# Patient Record
Sex: Female | Born: 1960 | Race: Black or African American | Hispanic: No | Marital: Married | Smoking: Former smoker
Health system: Southern US, Community
[De-identification: ages and names within clinical notes are randomized; demographics above are authoritative.]

## PROBLEM LIST (undated history)

## (undated) DIAGNOSIS — S83249A Other tear of medial meniscus, current injury, unspecified knee, initial encounter: Secondary | ICD-10-CM

## (undated) HISTORY — PX: ECTOPIC PREGNANCY SURGERY: SHX613

## (undated) HISTORY — PX: TUBAL LIGATION: SHX77

## (undated) HISTORY — PX: LAPAROSCOPIC TUBAL LIGATION: SUR803

## (undated) HISTORY — PX: APPENDECTOMY: SHX54

---

## 1998-04-15 ENCOUNTER — Ambulatory Visit: Admission: RE | Admit: 1998-04-15 | Discharge: 1998-04-15 | Payer: Self-pay | Admitting: Family Medicine

## 1998-04-16 ENCOUNTER — Ambulatory Visit (HOSPITAL_COMMUNITY): Admission: RE | Admit: 1998-04-16 | Discharge: 1998-04-16 | Payer: Self-pay | Admitting: Family Medicine

## 1999-07-03 ENCOUNTER — Inpatient Hospital Stay (HOSPITAL_COMMUNITY): Admission: AD | Admit: 1999-07-03 | Discharge: 1999-07-03 | Payer: Self-pay | Admitting: Family Medicine

## 1999-07-04 ENCOUNTER — Inpatient Hospital Stay (HOSPITAL_COMMUNITY): Admission: AD | Admit: 1999-07-04 | Discharge: 1999-07-04 | Payer: Self-pay | Admitting: Family Medicine

## 1999-12-29 ENCOUNTER — Emergency Department (HOSPITAL_COMMUNITY): Admission: EM | Admit: 1999-12-29 | Discharge: 1999-12-29 | Payer: Self-pay | Admitting: Emergency Medicine

## 2000-03-16 ENCOUNTER — Other Ambulatory Visit: Admission: RE | Admit: 2000-03-16 | Discharge: 2000-03-16 | Payer: Self-pay | Admitting: Family Medicine

## 2000-11-25 ENCOUNTER — Encounter: Payer: Self-pay | Admitting: Family Medicine

## 2000-11-25 ENCOUNTER — Encounter: Admission: RE | Admit: 2000-11-25 | Discharge: 2000-11-25 | Payer: Self-pay | Admitting: Family Medicine

## 2001-08-09 ENCOUNTER — Other Ambulatory Visit: Admission: RE | Admit: 2001-08-09 | Discharge: 2001-08-09 | Payer: Self-pay | Admitting: Family Medicine

## 2001-08-20 ENCOUNTER — Emergency Department (HOSPITAL_COMMUNITY): Admission: EM | Admit: 2001-08-20 | Discharge: 2001-08-20 | Payer: Self-pay | Admitting: Emergency Medicine

## 2002-04-30 ENCOUNTER — Emergency Department (HOSPITAL_COMMUNITY): Admission: EM | Admit: 2002-04-30 | Discharge: 2002-04-30 | Payer: Self-pay | Admitting: Emergency Medicine

## 2002-08-28 ENCOUNTER — Inpatient Hospital Stay (HOSPITAL_COMMUNITY): Admission: AD | Admit: 2002-08-28 | Discharge: 2002-08-28 | Payer: Self-pay | Admitting: Family Medicine

## 2002-11-14 ENCOUNTER — Encounter: Payer: Self-pay | Admitting: Emergency Medicine

## 2002-11-14 ENCOUNTER — Emergency Department (HOSPITAL_COMMUNITY): Admission: EM | Admit: 2002-11-14 | Discharge: 2002-11-14 | Payer: Self-pay | Admitting: Emergency Medicine

## 2003-07-28 ENCOUNTER — Emergency Department (HOSPITAL_COMMUNITY): Admission: EM | Admit: 2003-07-28 | Discharge: 2003-07-28 | Payer: Self-pay | Admitting: Emergency Medicine

## 2004-03-04 ENCOUNTER — Emergency Department (HOSPITAL_COMMUNITY): Admission: EM | Admit: 2004-03-04 | Discharge: 2004-03-04 | Payer: Self-pay | Admitting: Emergency Medicine

## 2004-06-21 ENCOUNTER — Emergency Department (HOSPITAL_COMMUNITY): Admission: EM | Admit: 2004-06-21 | Discharge: 2004-06-21 | Payer: Self-pay | Admitting: Emergency Medicine

## 2004-07-07 ENCOUNTER — Emergency Department (HOSPITAL_COMMUNITY): Admission: EM | Admit: 2004-07-07 | Discharge: 2004-07-08 | Payer: Self-pay | Admitting: Emergency Medicine

## 2004-07-29 ENCOUNTER — Ambulatory Visit: Payer: Self-pay | Admitting: Family Medicine

## 2004-10-14 ENCOUNTER — Ambulatory Visit: Payer: Self-pay | Admitting: Family Medicine

## 2004-10-14 DIAGNOSIS — K219 Gastro-esophageal reflux disease without esophagitis: Secondary | ICD-10-CM

## 2004-11-25 ENCOUNTER — Ambulatory Visit: Payer: Self-pay | Admitting: Family Medicine

## 2004-12-18 ENCOUNTER — Ambulatory Visit (HOSPITAL_COMMUNITY): Admission: RE | Admit: 2004-12-18 | Discharge: 2004-12-18 | Payer: Self-pay | Admitting: Family Medicine

## 2005-01-26 ENCOUNTER — Ambulatory Visit: Payer: Self-pay | Admitting: Family Medicine

## 2005-01-28 ENCOUNTER — Ambulatory Visit: Payer: Self-pay | Admitting: Family Medicine

## 2005-12-14 ENCOUNTER — Emergency Department (HOSPITAL_COMMUNITY): Admission: EM | Admit: 2005-12-14 | Discharge: 2005-12-14 | Payer: Self-pay | Admitting: Family Medicine

## 2006-07-19 ENCOUNTER — Emergency Department (HOSPITAL_COMMUNITY): Admission: EM | Admit: 2006-07-19 | Discharge: 2006-07-19 | Payer: Self-pay | Admitting: Emergency Medicine

## 2006-08-14 ENCOUNTER — Emergency Department (HOSPITAL_COMMUNITY): Admission: EM | Admit: 2006-08-14 | Discharge: 2006-08-15 | Payer: Self-pay | Admitting: Emergency Medicine

## 2006-09-16 ENCOUNTER — Ambulatory Visit: Payer: Self-pay | Admitting: Family Medicine

## 2006-09-19 ENCOUNTER — Ambulatory Visit: Payer: Self-pay | Admitting: Family Medicine

## 2006-12-26 ENCOUNTER — Ambulatory Visit: Payer: Self-pay | Admitting: Family Medicine

## 2006-12-26 ENCOUNTER — Encounter (INDEPENDENT_AMBULATORY_CARE_PROVIDER_SITE_OTHER): Payer: Self-pay | Admitting: Family Medicine

## 2007-01-26 ENCOUNTER — Ambulatory Visit (HOSPITAL_COMMUNITY): Admission: RE | Admit: 2007-01-26 | Discharge: 2007-01-26 | Payer: Self-pay | Admitting: Internal Medicine

## 2007-01-30 ENCOUNTER — Emergency Department (HOSPITAL_COMMUNITY): Admission: EM | Admit: 2007-01-30 | Discharge: 2007-01-30 | Payer: Self-pay | Admitting: Emergency Medicine

## 2007-02-01 ENCOUNTER — Ambulatory Visit: Payer: Self-pay | Admitting: *Deleted

## 2007-08-02 ENCOUNTER — Encounter (INDEPENDENT_AMBULATORY_CARE_PROVIDER_SITE_OTHER): Payer: Self-pay | Admitting: *Deleted

## 2008-03-05 ENCOUNTER — Encounter (INDEPENDENT_AMBULATORY_CARE_PROVIDER_SITE_OTHER): Payer: Self-pay | Admitting: Family Medicine

## 2008-03-05 ENCOUNTER — Ambulatory Visit: Payer: Self-pay | Admitting: Family Medicine

## 2008-03-05 DIAGNOSIS — F17200 Nicotine dependence, unspecified, uncomplicated: Secondary | ICD-10-CM | POA: Insufficient documentation

## 2008-03-05 LAB — CONVERTED CEMR LAB
BUN: 15 mg/dL (ref 6–23)
Basophils Relative: 0 % (ref 0–1)
CO2: 28 meq/L (ref 19–32)
Cholesterol: 173 mg/dL (ref 0–200)
Eosinophils Absolute: 0.1 10*3/uL (ref 0.0–0.7)
Glucose, Bld: 80 mg/dL (ref 70–99)
Hemoglobin: 13.8 g/dL (ref 12.0–15.0)
Ketones, urine, test strip: NEGATIVE
LDL Cholesterol: 100 mg/dL — ABNORMAL HIGH (ref 0–99)
Lymphocytes Relative: 54 % — ABNORMAL HIGH (ref 12–46)
Lymphs Abs: 4.8 10*3/uL — ABNORMAL HIGH (ref 0.7–4.0)
MCHC: 30.9 g/dL (ref 30.0–36.0)
MCV: 94.9 fL (ref 78.0–100.0)
Monocytes Absolute: 0.5 10*3/uL (ref 0.1–1.0)
Neutrophils Relative %: 40 % — ABNORMAL LOW (ref 43–77)
Nitrite: NEGATIVE
Pap Smear: NORMAL
Platelets: 386 10*3/uL (ref 150–400)
Protein, U semiquant: NEGATIVE
TSH: 1.806 microintl units/mL (ref 0.350–5.50)
Total CHOL/HDL Ratio: 3.1
Urobilinogen, UA: 0.2
VLDL: 18 mg/dL (ref 0–40)
WBC Urine, dipstick: NEGATIVE
pH: 6

## 2009-03-05 ENCOUNTER — Encounter (INDEPENDENT_AMBULATORY_CARE_PROVIDER_SITE_OTHER): Payer: Self-pay | Admitting: Family Medicine

## 2009-03-05 ENCOUNTER — Other Ambulatory Visit: Admission: RE | Admit: 2009-03-05 | Discharge: 2009-03-05 | Payer: Self-pay | Admitting: Family Medicine

## 2009-03-05 ENCOUNTER — Ambulatory Visit: Payer: Self-pay | Admitting: Family Medicine

## 2009-03-05 DIAGNOSIS — M62838 Other muscle spasm: Secondary | ICD-10-CM

## 2009-03-05 DIAGNOSIS — R1310 Dysphagia, unspecified: Secondary | ICD-10-CM

## 2009-03-05 DIAGNOSIS — N951 Menopausal and female climacteric states: Secondary | ICD-10-CM | POA: Insufficient documentation

## 2009-03-05 LAB — CONVERTED CEMR LAB
ALT: 21 units/L (ref 0–35)
AST: 21 units/L (ref 0–37)
Albumin: 4.3 g/dL (ref 3.5–5.2)
Basophils Relative: 1 % (ref 0–1)
Eosinophils Relative: 2 % (ref 0–5)
Glucose, Bld: 81 mg/dL (ref 70–99)
HCT: 41.4 % (ref 36.0–46.0)
HDL: 55 mg/dL (ref >39)
Hemoglobin: 13.1 g/dL (ref 12.0–15.0)
Lymphocytes Relative: 54 % — ABNORMAL HIGH (ref 12–46)
MCHC: 31.6 g/dL (ref 30.0–36.0)
Monocytes Relative: 5 % (ref 3–12)
Neutro Abs: 3.2 10*3/uL (ref 1.7–7.7)
Neutrophils Relative %: 39 % — ABNORMAL LOW (ref 43–77)
RBC: 4.57 M/uL (ref 3.87–5.11)
Sodium: 143 meq/L (ref 135–145)
TSH: 1.489 microintl units/mL (ref 0.350–4.500)
Total Bilirubin: 0.2 mg/dL — ABNORMAL LOW (ref 0.3–1.2)
WBC: 8.3 10*3/uL (ref 4.0–10.5)

## 2009-03-07 ENCOUNTER — Ambulatory Visit (HOSPITAL_COMMUNITY): Admission: RE | Admit: 2009-03-07 | Discharge: 2009-03-07 | Payer: Self-pay | Admitting: Family Medicine

## 2009-03-07 ENCOUNTER — Encounter (INDEPENDENT_AMBULATORY_CARE_PROVIDER_SITE_OTHER): Payer: Self-pay | Admitting: Family Medicine

## 2009-03-09 ENCOUNTER — Encounter (INDEPENDENT_AMBULATORY_CARE_PROVIDER_SITE_OTHER): Payer: Self-pay | Admitting: Family Medicine

## 2009-03-12 ENCOUNTER — Encounter (INDEPENDENT_AMBULATORY_CARE_PROVIDER_SITE_OTHER): Payer: Self-pay | Admitting: Family Medicine

## 2009-03-14 ENCOUNTER — Ambulatory Visit (HOSPITAL_COMMUNITY): Admission: RE | Admit: 2009-03-14 | Discharge: 2009-03-14 | Payer: Self-pay | Admitting: Family Medicine

## 2009-04-01 ENCOUNTER — Ambulatory Visit: Payer: Self-pay | Admitting: Family Medicine

## 2009-04-03 ENCOUNTER — Ambulatory Visit: Payer: Self-pay | Admitting: Family Medicine

## 2009-05-29 ENCOUNTER — Emergency Department (HOSPITAL_COMMUNITY): Admission: EM | Admit: 2009-05-29 | Discharge: 2009-05-29 | Payer: Self-pay | Admitting: Family Medicine

## 2009-06-04 ENCOUNTER — Emergency Department (HOSPITAL_COMMUNITY): Admission: EM | Admit: 2009-06-04 | Discharge: 2009-06-04 | Payer: Self-pay | Admitting: Emergency Medicine

## 2010-03-05 ENCOUNTER — Emergency Department (HOSPITAL_COMMUNITY): Admission: EM | Admit: 2010-03-05 | Discharge: 2010-03-05 | Payer: Self-pay | Admitting: Family Medicine

## 2010-07-20 ENCOUNTER — Emergency Department (HOSPITAL_COMMUNITY): Admission: EM | Admit: 2010-07-20 | Discharge: 2010-07-20 | Payer: Self-pay | Admitting: Emergency Medicine

## 2010-11-11 ENCOUNTER — Ambulatory Visit (HOSPITAL_COMMUNITY)
Admission: RE | Admit: 2010-11-11 | Discharge: 2010-11-11 | Payer: Self-pay | Source: Home / Self Care | Attending: Family Medicine | Admitting: Family Medicine

## 2010-11-12 ENCOUNTER — Encounter (INDEPENDENT_AMBULATORY_CARE_PROVIDER_SITE_OTHER): Payer: Self-pay | Admitting: Nurse Practitioner

## 2010-11-13 ENCOUNTER — Encounter (INDEPENDENT_AMBULATORY_CARE_PROVIDER_SITE_OTHER): Payer: Self-pay | Admitting: Internal Medicine

## 2010-11-17 ENCOUNTER — Encounter (INDEPENDENT_AMBULATORY_CARE_PROVIDER_SITE_OTHER): Payer: Self-pay | Admitting: Internal Medicine

## 2010-11-18 ENCOUNTER — Encounter
Admission: RE | Admit: 2010-11-18 | Discharge: 2010-11-18 | Payer: Self-pay | Source: Home / Self Care | Attending: Internal Medicine | Admitting: Internal Medicine

## 2010-12-06 ENCOUNTER — Encounter: Payer: Self-pay | Admitting: Family Medicine

## 2010-12-08 ENCOUNTER — Encounter (INDEPENDENT_AMBULATORY_CARE_PROVIDER_SITE_OTHER): Payer: Self-pay | Admitting: Internal Medicine

## 2010-12-15 NOTE — Assessment & Plan Note (Signed)
Summary: CPP////KT   Vital Signs:  Patient profile:   50 year old female LMP:     02/13/2009 Weight:      192.5 pounds BMI:     34.77 Temp:     98.7 degrees F Pulse rate:   72 / minute Pulse rhythm:   regular Resp:     20 per minute BP sitting:   124 / 74  (left arm) Cuff size:   large  Vitals Entered By: Vesta Mixer CMA (March 05, 2009 9:57 AM) CC: CPP- Never tried chantix from last year, but may want to now Is Patient Diabetic? No Pain Assessment Patient in pain? no       Does patient need assistance? Ambulation Normal LMP (date): 02/13/2009 LMP - Character: light Menarche (age onset): 14 years   days  Menstrual flow (days) 7 Enter LMP: 02/13/2009 Last PAP Result normal   History of Present Illness: Here for CPP. Married;one partner. Defers STD eval. Is fasting. No vaginal complaints. Has hot flashes and night sweats > 1 year. No vaginal dryness. Not on any meds.  #1 Has been without a menses in 6-9 months then had 1 period in 4 /2010(lasted 5 days).  #2 Having problems swallowing with feeling may choke with  both liquids and solids. Started about 6 months ago. Has improved but still has some choking sensation(Has to hold her neck hyperextended for food to pass).Has happened maybe 4x in last 2 months. Has had a cold recently with mild sorethroat and hoarseness in last week only. Is a smoker.  #3 Sometimes has gas and get sharp pain under diaphragm on back right side...helps when she belches.  #4 Had a pulled muscle several years ago on right side under shoulder. Occasionally causes pain between shoulder blades and around right side.Massage helps.    Allergies (verified): 1)  ! Ampicillin (Ampicillin)  Past History:  Past Medical History:    Reviewed history from 03/05/2008 and no changes required:    GERD (10/14/2004)  Past Surgical History:    Reviewed history from 03/05/2008 and no changes required:    Tubal ligation (1995)    Caesarean  section x 2    Appendectomy, age 50    s/p ectopic pregnancies ,lost one fallopian tube 1994  Family History:    Reviewed history from 03/05/2008 and no changes required:       Mother living Dementia,cirrhosis from Hep C, h/o idiopathic renal failure(s/p renal transplant). Scleroderma              Father living no problems       Brother living no problems              No heart disease/DM/CVAs.       o breast /ovarian/cervical/uterine/colon cancer.....she does say that PGM died of unknown cancer.  Social History:    Reviewed history from 03/05/2008 and no changes required:       Marriedx5 yrs.Monogamous       2 sons       unemployed       Current Smoker       Alcohol use-no       Drug use-no  Physical Exam  General:  Well-developed,well-nourished,in no acute distress; alert,appropriate and cooperative throughout examination Head:  Normocephalic and atraumatic without obvious abnormalities. No apparent alopecia or balding. Eyes:  No corneal or conjunctival inflammation noted. EOMI. Perrla.  Ears:  External ear exam shows no significant lesions or deformities.  Otoscopic examination reveals clear canals,  tympanic membranes are intact bilaterally without bulging, retraction, inflammation or discharge. Hearing is grossly normal bilaterally. Nose:  External nasal examination shows no deformity or inflammation. Nasal mucosa are pink and moist without lesions or exudates. Mouth:  Oral mucosa and oropharynx without lesions or exudates.  Teeth in good repair.pharynx pink and moist, no erythema, and no exudates.   Neck:  supple, no masses, and no thyromegaly.   Breasts:  No mass, nodules, thickening, tenderness, bulging, retraction, inflamation, nipple discharge or skin changes noted.   Lungs:  Normal respiratory effort, chest expands symmetrically. Lungs are clear to auscultation, no crackles or wheezes. Heart:  Normal rate and regular rhythm. S1 and S2 normal without gallop, murmur, click,  rub or other extra sounds. Abdomen:  Bowel sounds positive,abdomen soft and non-tender without masses, organomegaly or hernias noted. Rectal:  deferred Genitalia:  Pelvic Exam:        External: normal female genitalia without lesions or masses        Vagina: normal without lesions or masses        Cervix: normal without lesions or masses        Adnexa: normal bimanual exam without masses or fullness        Uterus: normal by palpation        Pap smear: performed Msk:  No deformity or scoliosis noted of thoracic or lumbar spine.   No current reproducible pain on palpation of right thoracic area/spine. Pulses:  R dorsalis pedis normal and L dorsalis pedis normal.   Extremities:  No CCE Neurologic:  alert & oriented X3 and cranial nerves II-XII intact grossly.  Skin:  Intact without suspicious lesions or rashes Cervical Nodes:  No lymphadenopathy noted Axillary Nodes:  No palpable lymphadenopathy Psych:  Oriented X3, memory intact for recent and remote, normally interactive, good eye contact, not anxious appearing, and not depressed appearing.     Impression & Recommendations:  Problem # 1:  EXAMINATION, ROUTINE MEDICAL (ICD-V70.0) Last Td 12/2006. Saw [facility] last year.  Orders: UA Dipstick w/o Micro (automated)  (81003) T-General Health Panel (CBCD, CMP, TSH) ([address]) T-Lipid Profile (40981-19147) Mammogram Pap done.  Problem # 2:  DYSPHAGIA UNSPECIFIED (ICD-787.20) Will check Barium swallow for esophageal stenosis/web/other abnormality Orders: Radiology other (Radiology Other)  Problem # 3:  MENOPAUSAL SYNDROME (ICD-627.2) Did review s/sx with patient. May eventually need vaginal lubricant as needed. Start Kegel exercises. If has further vaginal bleeding after next 12 months then will need further evaluation.Md d/w patient.  Problem # 4:  MUSCLE SPASM (ICD-728.85) Massage as needed. Supportive Bra.  Complete Medication List: 1)  Chantix Starting Month Pak  0.5 Mg X 11 & 1 Mg X 42 Misc (Varenicline tartrate) .... Per directions 2)  Chantix Continuing Month Pak 1 Mg Tabs (Varenicline tartrate) .... Per direction  Other Orders: Mammogram (Screening) (Mammo) Thin Prep Pap (82956) Pap Smear, Thin Prep ( Collection of) (O1308)  Patient Instructions: 1)  Please schedule a follow-up appointment in 1 year.  2)  Tobacco is very bad for your health and your loved ones ! You should stop smoking !  3)  Stop smoking tips: Choose a quit date. Cut down before the quit date. Decide what you will do as a substitute when you feel the urge to smoke(gum, toothpick, exercise).    Tetanus/Td Immunization History:    Tetanus/Td # 1:  Td (12/16/2006)     Appended Document: CPP////KT  Laboratory Results   Urine Tests    Routine Urinalysis  Glucose: negative   (Normal Range: Negative) Bilirubin: negative   (Normal Range: Negative) Ketone: negative   (Normal Range: Negative) Spec. Gravity: >=1.030   (Normal Range: 1.003-1.035) Blood: negative   (Normal Range: Negative) pH: 5.5   (Normal Range: 5.0-8.0) Protein: trace   (Normal Range: Negative) Urobilinogen: 0.2   (Normal Range: 0-1) Nitrite: negative   (Normal Range: Negative) Leukocyte Esterace: negative   (Normal Range: Negative)

## 2010-12-17 NOTE — Letter (Signed)
Summary: AUTHORIZATION FOR HEALTH INFORMATION//NOT COMPLETE  AUTHORIZATION FOR HEALTH INFORMATION//NOT COMPLETE   Imported By: Arta Bruce 11/23/2010 16:14:48  _____________________________________________________________________  External Attachment:    Type:   Image     Comment:   External Document

## 2011-02-02 LAB — BASIC METABOLIC PANEL
CO2: 28 mEq/L (ref 19–32)
Calcium: 9.3 mg/dL (ref 8.4–10.5)
Chloride: 104 mEq/L (ref 96–112)
GFR calc Af Amer: >60 mL/min (ref >60)
Glucose, Bld: 85 mg/dL (ref 70–99)

## 2011-02-02 LAB — CBC
Hemoglobin: 13.3 g/dL (ref 12.0–15.0)
MCHC: 34.1 g/dL (ref 30.0–36.0)
MCV: 91.1 fL (ref 78.0–100.0)
Platelets: 372 10*3/uL (ref 150–400)
RDW: 13.6 % (ref 11.5–15.5)

## 2011-02-21 LAB — CBC
HCT: 38.6 % (ref 36.0–46.0)
Platelets: 346 10*3/uL (ref 150–400)
WBC: 9.8 10*3/uL (ref 4.0–10.5)

## 2011-02-21 LAB — DIFFERENTIAL
Basophils Absolute: 0.1 10*3/uL (ref 0.0–0.1)
Lymphocytes Relative: 53 % — ABNORMAL HIGH (ref 12–46)
Monocytes Absolute: 0.4 10*3/uL (ref 0.1–1.0)
Neutrophils Relative %: 39 % — ABNORMAL LOW (ref 43–77)

## 2011-02-21 LAB — BASIC METABOLIC PANEL
CO2: 23 mEq/L (ref 19–32)
Chloride: 109 mEq/L (ref 96–112)
GFR calc Af Amer: >60 mL/min (ref >60)
Potassium: 4.2 mEq/L (ref 3.5–5.1)
Sodium: 139 mEq/L (ref 135–145)

## 2011-04-26 ENCOUNTER — Other Ambulatory Visit: Payer: Self-pay | Admitting: Internal Medicine

## 2011-04-26 DIAGNOSIS — N6310 Unspecified lump in the right breast, unspecified quadrant: Secondary | ICD-10-CM

## 2011-05-21 ENCOUNTER — Ambulatory Visit
Admission: RE | Admit: 2011-05-21 | Discharge: 2011-05-21 | Disposition: A | Discharge status: Home / Self Care | Payer: Self-pay | Source: Ambulatory Visit | Attending: Internal Medicine | Admitting: Internal Medicine

## 2011-05-21 DIAGNOSIS — N6310 Unspecified lump in the right breast, unspecified quadrant: Secondary | ICD-10-CM

## 2011-06-22 ENCOUNTER — Other Ambulatory Visit: Payer: Self-pay | Admitting: Family Medicine

## 2011-07-26 ENCOUNTER — Inpatient Hospital Stay (INDEPENDENT_AMBULATORY_CARE_PROVIDER_SITE_OTHER)
Admission: RE | Admit: 2011-07-26 | Discharge: 2011-07-26 | Disposition: A | Discharge status: Home / Self Care | Payer: Self-pay | Source: Ambulatory Visit | Attending: Family Medicine | Admitting: Family Medicine

## 2011-07-26 DIAGNOSIS — S0180XA Unspecified open wound of other part of head, initial encounter: Secondary | ICD-10-CM

## 2011-08-03 ENCOUNTER — Inpatient Hospital Stay (HOSPITAL_COMMUNITY)
Admission: RE | Admit: 2011-08-03 | Discharge: 2011-08-03 | Disposition: A | Discharge status: Home / Self Care | Payer: Self-pay | Source: Ambulatory Visit | Attending: Family Medicine | Admitting: Family Medicine

## 2011-09-11 ENCOUNTER — Inpatient Hospital Stay (INDEPENDENT_AMBULATORY_CARE_PROVIDER_SITE_OTHER)
Admission: RE | Admit: 2011-09-11 | Discharge: 2011-09-11 | Disposition: A | Discharge status: Home / Self Care | Payer: Self-pay | Source: Ambulatory Visit | Attending: Emergency Medicine | Admitting: Emergency Medicine

## 2011-09-11 DIAGNOSIS — N390 Urinary tract infection, site not specified: Secondary | ICD-10-CM

## 2011-09-11 LAB — POCT URINALYSIS DIP (DEVICE)
Bilirubin Urine: NEGATIVE
Glucose, UA: NEGATIVE mg/dL
Hgb urine dipstick: NEGATIVE
Ketones, ur: NEGATIVE mg/dL
Leukocytes, UA: NEGATIVE
Nitrite: POSITIVE — AB
Protein, ur: NEGATIVE mg/dL
Urobilinogen, UA: 0.2 mg/dL (ref 0.0–1.0)
pH: 5.5 (ref 5.0–8.0)

## 2011-10-30 ENCOUNTER — Emergency Department (HOSPITAL_COMMUNITY)
Admission: EM | Admit: 2011-10-30 | Discharge: 2011-10-30 | Payer: Self-pay | Source: Home / Self Care | Attending: Family Medicine | Admitting: Family Medicine

## 2011-10-30 ENCOUNTER — Emergency Department (INDEPENDENT_AMBULATORY_CARE_PROVIDER_SITE_OTHER)
Admission: EM | Admit: 2011-10-30 | Discharge: 2011-10-30 | Disposition: A | Discharge status: Home / Self Care | Payer: Self-pay | Source: Home / Self Care | Attending: Family Medicine | Admitting: Family Medicine

## 2011-10-30 DIAGNOSIS — M7550 Bursitis of unspecified shoulder: Secondary | ICD-10-CM

## 2011-10-30 DIAGNOSIS — M75100 Unspecified rotator cuff tear or rupture of unspecified shoulder, not specified as traumatic: Secondary | ICD-10-CM

## 2011-10-30 DIAGNOSIS — IMO0002 Reserved for concepts with insufficient information to code with codable children: Secondary | ICD-10-CM

## 2011-10-30 MED ORDER — KETOROLAC TROMETHAMINE 60 MG/2ML IM SOLN
60.0000 mg | Freq: Once | INTRAMUSCULAR | Status: AC
  Administered 2011-10-30: 60 mg via INTRAMUSCULAR

## 2011-10-30 MED ORDER — KETOROLAC TROMETHAMINE 60 MG/2ML IM SOLN
INTRAMUSCULAR | Status: AC
  Filled 2011-10-30: qty 2

## 2011-10-30 MED ORDER — MELOXICAM 7.5 MG PO TABS: 7.5000 mg | ORAL_TABLET | Freq: Every day | ORAL | Status: AC

## 2011-10-30 MED ORDER — TRAMADOL HCL 50 MG PO TABS: 50.0000 mg | ORAL_TABLET | Freq: Four times a day (QID) | ORAL | Status: AC | PRN
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
## 2011-10-30 NOTE — ED Notes (Signed)
Pt has muscle spasm in lt upper back for three days

## 2011-10-30 NOTE — ED Provider Notes (Cosign Needed Addendum)
History     CSN: [id number] Arrival date & time: 10/30/2011  4:07 PM   First MD Initiated Contact with Patient 10/30/11 1423      No chief complaint on file.   (Consider location/radiation/quality/duration/timing/severity/associated sxs/prior treatment) HPI  No past medical history on file.  No past surgical history on file.  No family history on file.  History  Substance Use Topics  . Smoking status: Never Smoker   . Smokeless tobacco: Not on file  . Alcohol Use: No    OB History    Grav Para Term Preterm Abortions TAB SAB Ect Mult Living                  Review of Systems  Allergies  Ampicillin and Percocet  Home Medications   Current Outpatient Rx  Name Route Sig Dispense Refill  . MELOXICAM 7.5 MG PO TABS Oral Take 1 tablet (7.5 mg total) by mouth daily. 30 tablet 2  . TRAMADOL HCL 50 MG PO TABS Oral Take 1 tablet (50 mg total) by mouth every 6 (six) hours as needed for pain. Maximum dose= 8 tablets per day 30 tablet 0    There were no vitals taken for this visit.  Physical Exam  ED Course  Procedures (including critical care time)  Labs Reviewed - No data to display No results found.   No diagnosis found.    MDM          Hassan Rowan, MD 10/30/11 2129  Hassan Rowan, MD 10/30/11 2137  Hassan Rowan, MD 10/30/11 2138  Hassan Rowan, MD 10/30/11 5621  Hassan Rowan, MD 10/30/11 3086  Hassan Rowan, MD 10/30/11 2141

## 2011-11-01 ENCOUNTER — Other Ambulatory Visit: Payer: Self-pay

## 2011-11-01 ENCOUNTER — Emergency Department (HOSPITAL_COMMUNITY)
Admission: EM | Admit: 2011-11-01 | Discharge: 2011-11-01 | Disposition: A | Discharge status: Home / Self Care | Payer: Self-pay | Attending: Emergency Medicine | Admitting: Emergency Medicine

## 2011-11-01 ENCOUNTER — Emergency Department (INDEPENDENT_AMBULATORY_CARE_PROVIDER_SITE_OTHER): Payer: Self-pay

## 2011-11-01 ENCOUNTER — Emergency Department (INDEPENDENT_AMBULATORY_CARE_PROVIDER_SITE_OTHER)
Admission: EM | Admit: 2011-11-01 | Discharge: 2011-11-01 | Disposition: A | Discharge status: Nursing Home (Includes ICF) | Payer: Self-pay | Source: Home / Self Care | Attending: Emergency Medicine | Admitting: Emergency Medicine

## 2011-11-01 ENCOUNTER — Encounter (HOSPITAL_COMMUNITY): Payer: Self-pay | Admitting: Emergency Medicine

## 2011-11-01 ENCOUNTER — Encounter (HOSPITAL_COMMUNITY): Payer: Self-pay

## 2011-11-01 DIAGNOSIS — M546 Pain in thoracic spine: Secondary | ICD-10-CM

## 2011-11-01 DIAGNOSIS — M549 Dorsalgia, unspecified: Secondary | ICD-10-CM

## 2011-11-01 DIAGNOSIS — Z79899 Other long term (current) drug therapy: Secondary | ICD-10-CM | POA: Insufficient documentation

## 2011-11-01 DIAGNOSIS — M5380 Other specified dorsopathies, site unspecified: Secondary | ICD-10-CM | POA: Insufficient documentation

## 2011-11-01 DIAGNOSIS — M545 Low back pain, unspecified: Secondary | ICD-10-CM | POA: Insufficient documentation

## 2011-11-01 LAB — POCT I-STAT, CHEM 8
BUN: 17 mg/dL (ref 6–23)
Calcium, Ion: 1.25 mmol/L (ref 1.12–1.32)
Creatinine, Ser: 0.7 mg/dL (ref 0.50–1.10)
HCT: 42 % (ref 36.0–46.0)
Hemoglobin: 14.3 g/dL (ref 12.0–15.0)
Potassium: 4 mEq/L (ref 3.5–5.1)
Sodium: 141 mEq/L (ref 135–145)

## 2011-11-01 LAB — POCT URINALYSIS DIP (DEVICE)
Glucose, UA: NEGATIVE mg/dL
Hgb urine dipstick: NEGATIVE
Specific Gravity, Urine: 1.025 (ref 1.005–1.030)
Urobilinogen, UA: 0.2 mg/dL (ref 0.0–1.0)

## 2011-11-01 MED ORDER — ONDANSETRON HCL 8 MG PO TABS: 8.0000 mg | ORAL_TABLET | Freq: Once | ORAL | Status: DC

## 2011-11-01 MED ORDER — MORPHINE SULFATE 4 MG/ML IJ SOLN
4.0000 mg | Freq: Once | INTRAMUSCULAR | Status: AC
  Administered 2011-11-01: 4 mg via INTRAMUSCULAR
  Filled 2011-11-01: qty 1

## 2011-11-01 MED ORDER — HYDROCODONE-ACETAMINOPHEN 5-325 MG PO TABS: 1.0000 | ORAL_TABLET | ORAL | Status: DC | PRN

## 2011-11-01 MED ORDER — ONDANSETRON HCL 4 MG/2ML IJ SOLN: 4.0000 mg | Freq: Once | INTRAMUSCULAR | Status: DC

## 2011-11-01 MED ORDER — CYCLOBENZAPRINE HCL 10 MG PO TABS: 10.0000 mg | ORAL_TABLET | Freq: Two times a day (BID) | ORAL | Status: AC | PRN

## 2011-11-01 MED ORDER — HYDROCODONE-ACETAMINOPHEN 5-325 MG PO TABS
2.0000 | ORAL_TABLET | Freq: Once | ORAL | Status: AC
  Administered 2011-11-01: 2 via ORAL
  Filled 2011-11-01: qty 2

## 2011-11-01 MED ORDER — ONDANSETRON 4 MG PO TBDP
ORAL_TABLET | ORAL | Status: AC
  Administered 2011-11-01: 8 mg
  Filled 2011-11-01: qty 2

## 2011-11-01 MED ORDER — HYDROCODONE-ACETAMINOPHEN 5-325 MG PO TABS: 1.0000 | ORAL_TABLET | ORAL | Status: AC | PRN

## 2011-11-01 MED ORDER — ONDANSETRON HCL 4 MG/2ML IJ SOLN
INTRAMUSCULAR | Status: AC
  Filled 2011-11-01: qty 2

## 2011-11-01 MED ORDER — DIAZEPAM 5 MG/ML IJ SOLN
5.0000 mg | Freq: Once | INTRAMUSCULAR | Status: AC
  Administered 2011-11-01: 5 mg via INTRAMUSCULAR
  Filled 2011-11-01: qty 2

## 2011-11-01 NOTE — ED Provider Notes (Signed)
Medical screening examination/treatment/procedure(s) were conducted as a shared visit with non-physician practitioner(s) and myself.  I personally evaluated the patient during the encounter  See my full note  Forbes Cellar, MD 11/01/11 2242

## 2011-11-01 NOTE — ED Provider Notes (Signed)
  Physical Exam  BP 113/82  Pulse 82  Temp(Src) 97.7 F (36.5 C) (Oral)  Resp 20  Ht 5\' 2"  (1.575 m)  Wt 194 lb (87.998 kg)  BMI 35.48 kg/m2  SpO2 97%  Physical Exam  ED Course  Procedures Awaiting i-STAT, and d-dimer if these are normal.  Patient may leave MDM Patient with chronic back pain.  Seen by Dr. Hyman Hopes.  An x-ray was obtained and showed atelectasis.  This was concerning A. i-STAT, and d-dimer was ordered as this was a concern for possible PE.  The d-dimer and i-STAT were normal.  Will discharge patient per Dr. Marland Mcalpine instructions     Arman Filter, NP 11/01/11 2146

## 2011-11-01 NOTE — ED Notes (Signed)
Pt c/o lower back pain x several days; pt sts was seen here this weekend for same but not helping; pt sts hurts to take a deep breath; pt denies obvious injury

## 2011-11-01 NOTE — ED Notes (Signed)
Since here at [facility] on Saturday for left upper shoulder, dx with Bursitis.  Was rx with pain med and anti-inflammatory.  Now has right lower back pain and pt. states that if she takes a deep breath it hurts in that spot.  Denies radiation down right leg.  Has taken pain medication and anti-inflammatory without relief.  In addition, used a cream on lower back for pain last night.  And this cream made pain worse

## 2011-11-01 NOTE — ED Provider Notes (Signed)
Medical screening examination/treatment/procedure(s) were conducted as a shared visit with non-physician practitioner(s) and myself.  I personally evaluated the patient during the encounter  See my full note  Forbes Cellar, MD 11/01/11 2241

## 2011-11-01 NOTE — ED Notes (Signed)
Pt came to the ED complaining of back pain x few days. She states her current pain 8/9 out of 10. Pain medication given. Lung sounds are clear. Will continue to monitor.

## 2011-11-01 NOTE — ED Provider Notes (Addendum)
History     CSN: [id number] Arrival date & time: 11/01/2011 10:56 AM   First MD Initiated Contact with Patient 11/01/11 1054      Chief Complaint  Patient presents with  . Back Pain    Since here at [facility] on Saturday for left upper shoulder, dx with Bursitis.  Was rx with pain med and anti-inflammatory.  Now has right lower back pain and pt. states that if she takes a deep breath it hurts in that spot.     (Consider location/radiation/quality/duration/timing/severity/associated sxs/prior treatment) HPI Comments: My left shoulder its much better, the tramadol makes me a bit naueous. 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  Patient is a 50 y.o. female presenting with back pain. The history is provided by the patient. No language interpreter was used.  Back Pain  The current episode started yesterday. The problem occurs constantly. The pain is associated with no known injury. The quality of the pain is described as aching and shooting. The pain is at a severity of 8/10. The symptoms are aggravated by bending, twisting and certain positions. The pain is worse during the day. Stiffness is present in the morning. Pertinent negatives include no chest pain, no fever, no numbness, no abdominal pain, no bowel incontinence, no bladder incontinence, no dysuria, no paresthesias, no paresis, no tingling and no weakness. She has tried NSAIDs for the symptoms. The treatment provided no relief. Risk factors include obesity.    History reviewed. No pertinent past medical history.  Past Surgical History  Procedure Date  . Laparoscopic tubal ligation   . Appendectomy   . Ectopic pregnancy surgery     x2  . Cesarean section     History reviewed. No pertinent family history.  History  Substance Use Topics  . Smoking status:  Never Smoker   . Smokeless tobacco: Not on file  . Alcohol Use: No    OB History    Grav Para Term Preterm Abortions TAB SAB Ect Mult Living                  Review of Systems  Constitutional: Negative for fever.  Cardiovascular: Negative for chest pain.  Gastrointestinal: Negative for abdominal pain and bowel incontinence.  Genitourinary: Negative for bladder incontinence, dysuria and hematuria.  Musculoskeletal: Positive for back pain.  Neurological: Negative for tingling, weakness, numbness and paresthesias.    Allergies  Ampicillin and Percocet  Home Medications   Current Outpatient Rx  Name Route Sig Dispense Refill  . MELOXICAM 7.5 MG PO TABS Oral Take 1 tablet (7.5 mg total) by mouth daily. 30 tablet 2  . TRAMADOL HCL 50 MG PO TABS Oral Take 1 tablet (50 mg total) by mouth every 6 (six) hours as needed for pain. Maximum dose= 8 tablets per day 30 tablet 0    BP 129/81  Pulse 74  Temp(Src) 97.9 F (36.6 C) (Oral)  Resp 16  SpO2 98%  Physical Exam  Nursing note and vitals reviewed. Constitutional: She appears well-developed and well-nourished.  HENT:  Head: Normocephalic.  Eyes: Conjunctivae are normal.  Neck: Normal range of motion. Neck supple.  Cardiovascular: Normal rate.   Pulmonary/Chest: Effort normal and breath sounds normal. No respiratory distress. She has no decreased breath  sounds. She has no wheezes. She has no rhonchi. She has no rales.  Abdominal: Soft. There is no tenderness.  Musculoskeletal:       Arms: Neurological: She is alert. Coordination normal.  Skin: No erythema.    ED Course  Procedures (including critical care time)   Labs Reviewed  POCT URINALYSIS DIP (DEVICE)  POCT URINALYSIS DIPSTICK   No results found.   No diagnosis found.    MDM  R flank and upper lumbar region- reproducible with movement and palpation- exam and symptoms were consistent with lumbar musculoskeletal pain        Jimmie Molly, MD 11/01/11  1234  Jimmie Molly, MD 11/01/11 1234

## 2011-11-01 NOTE — ED Provider Notes (Addendum)
History     CSN: [id number] Arrival date & time: 11/01/2011  1:12 PM   First MD Initiated Contact with Patient 11/01/11 1748      Chief Complaint  Patient presents with  . Back Pain    (Consider location/radiation/quality/duration/timing/severity/associated sxs/prior treatment) HPI  50yoF briefly healthy presents with back pain. She states that she normally gets spasms in her back when she sits too long. 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 She states that the following day she began to feel left upper back pain and spasm. She was seen by urgent care and treated supportively. She states that she then began to feel right lower back pain. The pain is much worse with movement and with deep breathing, coughing. She denies shortness of breath, chest pain. She denies fever, chills. There is no pain radiating down her legs. Denies h/o VTE in self or family. No recent hosp/surg/immob. No h/o cancer. Denies exogenous hormone use, no leg pain or swelling. Denies history of recent trauma/falls. Denies h/o malignancy, DM, immunocompromise  injection drug use, immunosuppression, indwelling urinary catheter, prolonged steroid use, skin or urinary tract infection. No numbness/tingling/weakness of extremities. Denies fever/chills. Denies saddle anesthesia, no urinary incontinence or retention. She's had history of similar. She states that the pain as 9/10 at this time. Was referred here for further evaluation and pain management by urgent care Center    ED Notes, ED Provider Notes from 11/01/11 0000 to 11/01/11 13:27:26       Mittie Bodo, RN 11/01/2011 13:25      Pt c/o lower back pain x several days; pt sts was seen here this weekend for same but not helping; pt sts hurts to take a deep breath; pt denies obvious injury     History reviewed. No pertinent past medical history.  Past Surgical History  Procedure Date  . Laparoscopic tubal  ligation   . Appendectomy   . Ectopic pregnancy surgery     x2  . Cesarean section     History reviewed. No pertinent family history.  History  Substance Use Topics  . Smoking status: Never Smoker   . Smokeless tobacco: Not on file  . Alcohol Use: No    OB History    Grav Para Term Preterm Abortions TAB SAB Ect Mult Living                  Review of Systems  All other systems reviewed and are negative.   except as noted HPI   Allergies  Ampicillin and Percocet  Home Medications   Current Outpatient Rx  Name Route Sig Dispense Refill  . MELOXICAM 7.5 MG PO TABS Oral Take 1 tablet (7.5 mg total) by mouth daily. 30 tablet 2  . TRAMADOL HCL 50 MG PO TABS Oral Take 1 tablet (50 mg total) by mouth every 6 (six) hours as needed for pain. Maximum dose= 8 tablets per day 30 tablet 0  . CYCLOBENZAPRINE HCL 10 MG PO TABS Oral Take 1 tablet (10 mg total) by mouth 2 (two) times daily as needed for muscle spasms. 20 tablet 0  . HYDROCODONE-ACETAMINOPHEN 5-325 MG PO TABS Oral Take 1 tablet by mouth every 4 (four) hours as needed for pain. 10 tablet 0    BP 113/82  Pulse 82  Temp(Src) 97.7 F (36.5 C) (Oral)  Resp 20  Ht 5\' 2"  (1.575 m)  Wt 194 lb (87.998 kg)  BMI 35.48 kg/m2  SpO2 97%  Physical Exam  Nursing note and vitals reviewed. Constitutional: She is oriented to person, place, and time. She appears well-developed.       Appears to be in pain  HENT:  Head: Atraumatic.  Mouth/Throat: Oropharynx is clear and moist.  Eyes: Conjunctivae and EOM are normal. Pupils are equal, round, and reactive to light.  Neck: Normal range of motion. Neck supple.  Cardiovascular: Normal rate, regular rhythm, normal heart sounds and intact distal pulses.   Pulmonary/Chest: Effort normal and breath sounds normal. No respiratory distress. She has no wheezes. She has no rales.  Abdominal: Soft. She exhibits no distension. There is no tenderness. There is no rebound and no guarding.    Musculoskeletal: Normal range of motion.       Diffuse tenderness to palpation right paraspinal and right lower lumbar which she states reproduces her pain  Straight leg test negative bilaterally Strength 5 out of 5 bilateral lower extremities Sensation intact and peroneal area  Neurological: She is alert and oriented to person, place, and time.  Skin: Skin is warm and dry. No rash noted.  Psychiatric: She has a normal mood and affect.    Date: 11/01/2011  Rate: 68  Rhythm: normal sinus rhythm  QRS Axis: normal  Intervals: normal  ST/T Wave abnormalities: nonspecific T wave changes  Conduction Disutrbances:none  Narrative Interpretation:   Old EKG Reviewed: changes noted new t wave inv v2/3    ED Course  Procedures (including critical care time)   Labs Reviewed  I-STAT, CHEM 8  D-DIMER, QUANTITATIVE   Dg Chest 2 View  11/01/2011  *RADIOLOGY REPORT*  Clinical Data: Right-sided pain  CHEST - 2 VIEW  Comparison: 07/19/2006  Findings: Cardiomegaly is noted.  There is elevation of the right hemidiaphragm.  Right basilar atelectasis or infiltrate.  No pulmonary edema.  IMPRESSION: Elevation of the right hemidiaphragm.  Right basilar atelectasis or infiltrate.  No pulmonary edema.  Original Report Authenticated By: Natasha Mead, M.D.     1. Back pain     MDM  Likely musculoskeletal back pain muscle spasm. It is easily reproducible. 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 Her vital signs are stable and the pain is very reproducible on exam. Her chest x-ray which was ordered urgent care is just proximal my attention and is remarkable for elevated right hemidiaphragm and right basilar atelectasis or infiltrate. The patient is not coughing, she has not had fever shortness of breath. Denies pneumonia. She is low risk for PE as noted, will order d-dimer in light of suspicious CXR findings. I do not believe that this is related to a  pulmonary embolism. If her d-dimer is negative patient to go home with supportive care.         Forbes Cellar, MD 11/01/11 2020  Forbes Cellar, MD 11/01/11 2056

## 2011-11-01 NOTE — ED Provider Notes (Signed)
Lab results reviewed and discussed with patient.  Jimmye Norman, NP 11/01/11 [phone]

## 2011-11-01 NOTE — ED Notes (Signed)
Pt to ED for eval of R lower back pain; pt reports that she started to have pain on Saturday in L shoulder and went to [facility]; pt reports the dx her with bursitis and gave her medicine; pt reports that she started to have R lower back pain yesterday and it's gotten worse; pt denies radiation down to legs, denies numbness/tingling; pt reports that she think she strained her back;

## 2012-01-05 ENCOUNTER — Other Ambulatory Visit (HOSPITAL_COMMUNITY): Payer: Self-pay | Admitting: Family Medicine

## 2012-01-05 DIAGNOSIS — Z1231 Encounter for screening mammogram for malignant neoplasm of breast: Secondary | ICD-10-CM

## 2012-01-31 ENCOUNTER — Ambulatory Visit (HOSPITAL_COMMUNITY)
Admission: RE | Admit: 2012-01-31 | Discharge: 2012-01-31 | Disposition: A | Discharge status: Home / Self Care | Payer: Self-pay | Source: Ambulatory Visit | Attending: Family Medicine | Admitting: Family Medicine

## 2012-01-31 DIAGNOSIS — Z1231 Encounter for screening mammogram for malignant neoplasm of breast: Secondary | ICD-10-CM

## 2013-04-04 ENCOUNTER — Emergency Department (INDEPENDENT_AMBULATORY_CARE_PROVIDER_SITE_OTHER)
Admission: EM | Admit: 2013-04-04 | Discharge: 2013-04-04 | Disposition: A | Discharge status: Home / Self Care | Payer: Self-pay | Source: Home / Self Care | Attending: Family Medicine | Admitting: Family Medicine

## 2013-04-04 ENCOUNTER — Emergency Department (INDEPENDENT_AMBULATORY_CARE_PROVIDER_SITE_OTHER): Payer: Self-pay

## 2013-04-04 ENCOUNTER — Encounter (HOSPITAL_COMMUNITY): Payer: Self-pay | Admitting: Emergency Medicine

## 2013-04-04 DIAGNOSIS — M79609 Pain in unspecified limb: Secondary | ICD-10-CM

## 2013-04-04 DIAGNOSIS — M79604 Pain in right leg: Secondary | ICD-10-CM

## 2013-04-04 NOTE — ED Provider Notes (Signed)
History     CSN: [id number]  Arrival date & time 04/04/13  1518   First MD Initiated Contact with Patient 04/04/13 1616      Chief Complaint  Patient presents with  . Leg Pain    right leg pain x 1 yr. more severe today. pain localized in calf. denies injury.     (Consider location/radiation/quality/duration/timing/severity/associated sxs/prior treatment) Patient is a 52 y.o. female presenting with leg pain. The history is provided by the patient.  Leg Pain Location:  Leg Time since incident:  12 months Injury: no   Leg location:  R lower leg Pain details:    Quality:  Throbbing   Radiates to:  Does not radiate   Severity:  Moderate   Timing:  Intermittent   Progression:  Waxing and waning Chronicity:  Chronic Dislocation: no     History reviewed. No pertinent past medical history.  Past Surgical History  Procedure Laterality Date  . Laparoscopic tubal ligation    . Appendectomy    . Ectopic pregnancy surgery      x2  . Cesarean section      History reviewed. No pertinent family history.  History  Substance Use Topics  . Smoking status: Never Smoker   . Smokeless tobacco: Not on file  . Alcohol Use: No    OB History   Grav Para Term Preterm Abortions TAB SAB Ect Mult Living                  Review of Systems  Constitutional: Negative.   Cardiovascular: Negative for leg swelling.  Musculoskeletal: Positive for myalgias. Negative for joint swelling, arthralgias and gait problem.    Allergies  Ampicillin and Percocet  Home Medications  No current outpatient prescriptions on file.  BP 147/97  Pulse 76  Temp(Src) 98.1 F (36.7 C) (Oral)  Resp 18  SpO2 97%  Physical Exam  Nursing note and vitals reviewed. Constitutional: She is oriented to person, place, and time. She appears well-developed and well-nourished.  Musculoskeletal: She exhibits tenderness. She exhibits no edema.       Right lower leg: She exhibits tenderness. She exhibits no bony  tenderness, no swelling, no edema and no deformity.       Legs: Neurological: She is alert and oriented to person, place, and time.  Skin: Skin is warm and dry.    ED Course  Procedures (including critical care time)  Labs Reviewed - No data to display Dg Tibia/fibula Right  04/04/2013   *RADIOLOGY REPORT*  Clinical Data: Leg pain.  RIGHT TIBIA AND FIBULA - 2 VIEW  Comparison: None  Findings: There is no evidence of fracture or dislocation.  There is no evidence of arthropathy or other focal bone abnormality. Soft tissues are unremarkable.  IMPRESSION: No acute findings identified.   Original Report Authenticated By: Signa Kell, M.D.     1. Lower extremity pain, lateral, right       MDM  X-rays reviewed and report per radiologist.  Pt concerned with poss dvt, will send for doppler study in am.        Linna Hoff, MD 04/04/13 1718

## 2013-04-04 NOTE — ED Notes (Signed)
Pt is being scheduled for DVT work up tomorrow 5/22. Per Dr. Artis Flock  Pt does not need lovenox just vascular study tomorrow. Pt agreed. Mw,cma.

## 2013-04-04 NOTE — ED Notes (Signed)
Pt c/o right leg pain that has been persistent x 1 yr. Pt states pain worse today. And pain is localized in calf.  Pain is worse with sitting and lying down. Pt denies injury. Pt has used otc meds with mild relief in pain.

## 2013-04-05 ENCOUNTER — Ambulatory Visit (HOSPITAL_COMMUNITY)
Admission: RE | Admit: 2013-04-05 | Discharge: 2013-04-05 | Disposition: A | Discharge status: Home / Self Care | Payer: Self-pay | Source: Ambulatory Visit | Attending: Family Medicine | Admitting: Family Medicine

## 2013-04-05 ENCOUNTER — Other Ambulatory Visit (HOSPITAL_COMMUNITY): Payer: Self-pay | Admitting: Family Medicine

## 2013-04-05 ENCOUNTER — Telehealth (HOSPITAL_COMMUNITY): Payer: Self-pay | Admitting: *Deleted

## 2013-04-05 DIAGNOSIS — M79609 Pain in unspecified limb: Secondary | ICD-10-CM

## 2013-04-05 DIAGNOSIS — M25561 Pain in right knee: Secondary | ICD-10-CM

## 2013-04-05 NOTE — ED Notes (Signed)
Blase Mess said Dr. Artis Flock asked me to call pt. her [place] result.  It is neg. For DVT.  I tried to call but the recording said if this call is a Patent attorney for Melissa Harding, this is no longer her number.

## 2013-04-05 NOTE — Progress Notes (Signed)
*  Preliminary Results* Right lower extremity venous duplex completed. Right lower extremity is negative for deep vein thrombosis. There is no evidence of right Baker's cyst.  04/05/2013 8:44 AM Gertie Fey, RDMS, RDCS

## 2013-04-09 ENCOUNTER — Telehealth (HOSPITAL_COMMUNITY): Payer: Self-pay | Admitting: *Deleted

## 2013-04-13 ENCOUNTER — Telehealth (HOSPITAL_COMMUNITY): Payer: Self-pay | Admitting: *Deleted

## 2013-04-17 NOTE — ED Notes (Signed)
attempts to reach pt by telephone have not been successful; letter sent to address listed at time of arrival to inform of negative results

## 2013-04-17 NOTE — ED Notes (Signed)
Unable to reach patient by phone, letter sent , asking patient to call and discuss visit

## 2013-05-08 ENCOUNTER — Encounter (HOSPITAL_COMMUNITY): Payer: Self-pay | Admitting: *Deleted

## 2013-05-08 ENCOUNTER — Emergency Department (INDEPENDENT_AMBULATORY_CARE_PROVIDER_SITE_OTHER)
Admission: EM | Admit: 2013-05-08 | Discharge: 2013-05-08 | Disposition: A | Discharge status: Home / Self Care | Payer: Self-pay | Source: Home / Self Care | Attending: Family Medicine | Admitting: Family Medicine

## 2013-05-08 DIAGNOSIS — L42 Pityriasis rosea: Secondary | ICD-10-CM

## 2013-05-08 NOTE — ED Notes (Signed)
Pt  Has  A  Rash  On  Upper  Torso  That  Does  Not  Itch      No  Angio  Edema  -  Appears in  No  Acute  Distress     No  New  meds    No  Known  Causative  Agents  -  Symptoms  X   2  Weeks

## 2013-05-08 NOTE — ED Provider Notes (Signed)
   History    CSN: [id number] Arrival date & time 05/08/13  1356  First MD Initiated Contact with Patient 05/08/13 1446     Chief Complaint  Patient presents with  . Rash   (Consider location/radiation/quality/duration/timing/severity/associated sxs/prior Treatment) HPI Comments: Patient reports that her rash started to have weeks ago. It started with a lesion on her left breast and was initially raised and slightly red. She then developed multiple round to oval areas on her chest upper back a few on her arms and legs that are nonpruritic. She denies preceding sore throat, fever, malaise, myalgias. She denies close contacts with rash. Takes no medications. She has tried hydrocortisone cream and go by powder without improvement.   She does work in Teacher, music. Other possible sources of exposure are distorted chopping and using an old loofah bath puff.  Patient is a 52 y.o. female presenting with rash.  Rash  History reviewed. No pertinent past medical history. Past Surgical History  Procedure Laterality Date  . Laparoscopic tubal ligation    . Appendectomy    . Ectopic pregnancy surgery      x2  . Cesarean section     History reviewed. No pertinent family history. History  Substance Use Topics  . Smoking status: Never Smoker   . Smokeless tobacco: Not on file  . Alcohol Use: No   OB History   Grav Para Term Preterm Abortions TAB SAB Ect Mult Living                 Review of Systems  Skin: Positive for rash.  All other systems reviewed and are negative.    Allergies  Ampicillin and Percocet  Home Medications  No current outpatient prescriptions on file. BP 135/79  Pulse 77  Temp(Src) 97.3 F (36.3 C) (Oral)  Resp 16  SpO2 100% Physical Exam  Constitutional: She appears well-developed and well-nourished. No distress.  Skin:  Oval erythematous macules with raised scaly borders predominantly on the upper chest and upper back. Patient also has few lesions on  lateral gluteal area, arms and legs.     ED Course  Procedures (including critical care time) Labs Reviewed - No data to display No results found. 1. Pityriasis rosea     MDM  Patient reassured regarding self-limited nature of the rash. She is encouraged to try sunbathing to shorten time to resolution.  Dessa Phi, MD 05/08/13 [phone]

## 2013-05-10 ENCOUNTER — Other Ambulatory Visit (HOSPITAL_COMMUNITY): Payer: Self-pay | Admitting: Family Medicine

## 2013-05-10 DIAGNOSIS — Z1231 Encounter for screening mammogram for malignant neoplasm of breast: Secondary | ICD-10-CM

## 2013-05-22 NOTE — ED Provider Notes (Signed)
Medical screening examination/treatment/procedure(s) were performed by resident physician or non-physician practitioner and as supervising physician I was immediately available for consultation/collaboration.   KINDL,JAMES DOUGLAS MD.   James D Kindl, MD 05/22/13 0843 

## 2013-05-23 ENCOUNTER — Ambulatory Visit (HOSPITAL_COMMUNITY)
Admission: RE | Admit: 2013-05-23 | Discharge: 2013-05-23 | Disposition: A | Discharge status: Home / Self Care | Payer: Self-pay | Source: Ambulatory Visit | Attending: Family Medicine | Admitting: Family Medicine

## 2013-05-23 DIAGNOSIS — Z1231 Encounter for screening mammogram for malignant neoplasm of breast: Secondary | ICD-10-CM

## 2013-08-11 ENCOUNTER — Encounter (HOSPITAL_COMMUNITY): Payer: Self-pay | Admitting: *Deleted

## 2013-08-11 ENCOUNTER — Emergency Department (INDEPENDENT_AMBULATORY_CARE_PROVIDER_SITE_OTHER)
Admission: EM | Admit: 2013-08-11 | Discharge: 2013-08-11 | Disposition: A | Discharge status: Home / Self Care | Payer: Self-pay | Source: Home / Self Care | Attending: Emergency Medicine | Admitting: Emergency Medicine

## 2013-08-11 DIAGNOSIS — R42 Dizziness and giddiness: Secondary | ICD-10-CM

## 2013-08-11 LAB — POCT I-STAT, CHEM 8
Creatinine, Ser: 0.9 mg/dL (ref 0.50–1.10)
HCT: 46 % (ref 36.0–46.0)
Hemoglobin: 15.6 g/dL — ABNORMAL HIGH (ref 12.0–15.0)
Potassium: 4.3 mEq/L (ref 3.5–5.1)
Sodium: 142 mEq/L (ref 135–145)
TCO2: 27 mmol/L (ref 0–100)

## 2013-08-11 NOTE — ED Provider Notes (Signed)
CSN: [id number]     Arrival date & time 08/11/13  1522 History   First MD Initiated Contact with Patient 08/11/13 1742     Chief Complaint  Patient presents with  . Dizziness   (Consider location/radiation/quality/duration/timing/severity/associated sxs/prior Treatment) HPI Comments: 52 year old female presents complaining of dizziness. This started while she was at work yesterday and she almost fainted. Since that time, she has felt very lightheaded. She has never experienced anything like this before. She has felt intermittently nauseous in addition to the dizziness. She denies fever, chills, headache, vomiting, diarrhea, chest pain, shortness of breath, diaphoresis, dark stools, any bleeding or rash.   History reviewed. No pertinent past medical history. Past Surgical History  Procedure Laterality Date  . Laparoscopic tubal ligation    . Appendectomy    . Ectopic pregnancy surgery      x2  . Cesarean section     History reviewed. No pertinent family history. History  Substance Use Topics  . Smoking status: Never Smoker   . Smokeless tobacco: Not on file  . Alcohol Use: No   OB History   Grav Para Term Preterm Abortions TAB SAB Ect Mult Living                 Review of Systems  Constitutional: Negative for fever and chills.  Eyes: Negative for visual disturbance.  Respiratory: Negative for cough and shortness of breath.   Cardiovascular: Negative for chest pain, palpitations and leg swelling.  Gastrointestinal: Positive for nausea. Negative for vomiting and abdominal pain.  Endocrine: Negative for polydipsia and polyuria.  Genitourinary: Negative for dysuria, urgency and frequency.  Musculoskeletal: Negative for myalgias and arthralgias.  Skin: Negative for rash.  Neurological: Positive for dizziness. Negative for weakness and light-headedness.    Allergies  Ampicillin and Percocet  Home Medications  No current outpatient prescriptions on file. BP 136/85  Pulse 71   Temp(Src) 98.4 F (36.9 C) (Oral)  Resp 18  SpO2 100% Physical Exam  Nursing note and vitals reviewed. Constitutional: She is oriented to person, place, and time. Vital signs are normal. She appears well-developed and well-nourished. No distress.  HENT:  Head: Normocephalic and atraumatic.  Mouth/Throat: Oropharynx is clear and moist.  Eyes: Conjunctivae and EOM are normal. Pupils are equal, round, and reactive to light.  Neck: Normal range of motion. Neck supple. No JVD present. No tracheal deviation present. No thyromegaly present.  Cardiovascular: Normal rate, regular rhythm and normal heart sounds.   Pulmonary/Chest: Effort normal. No respiratory distress. She has no wheezes. She has no rales.  Abdominal: Soft. There is no tenderness.  Lymphadenopathy:    She has no cervical adenopathy.  Neurological: She is alert and oriented to person, place, and time. She has normal strength. No cranial nerve deficit. Coordination normal.  Skin: Skin is warm and dry. No rash noted. She is not diaphoretic.  Psychiatric: She has a normal mood and affect. Judgment normal.    ED Course  Procedures (including critical care time) Labs Review Labs Reviewed  POCT I-STAT, CHEM 8 - Abnormal; Notable for the following:    Calcium, Ion 1.33 (*)    Hemoglobin 15.6 (*)    All other components within normal limits   Imaging Review No results found.  MDM   1. Dizziness    EKG is normal. I-STAT is normal. Neurologic exam is completely normal. She is not orthostatic. Will use watchful waiting with this, she'll followup in the emergency department if worsening, otherwise follow up with  primary care    Graylon Good, PA-C 08/11/13 [phone]

## 2013-08-11 NOTE — ED Provider Notes (Signed)
Medical screening examination/treatment/procedure(s) were performed by non-physician practitioner and as supervising physician I was immediately available for consultation/collaboration.  Kenzlee Fishburn, M.D.  Mollee Neer C Haskel Dewalt, MD 08/11/13 2105 

## 2013-08-11 NOTE — ED Notes (Signed)
PT  REPORTS  ALMOST  FAINTED  YEST  BUT  DID  NOT    SHE  DENYS  ANY  PAIN NAUSEA   VOMITING  BLEEDING          SHE  JUST  FEELS  DIZZY         SHE  AMBULATED  TO  ROOM WITH A  SLOW  STEADY  GAIT   SHE  IS  SITTING  UPRIGHT ON  EXAM TABLE  SPEAKING IN  COMPLETE  SENTANCES

## 2013-12-17 ENCOUNTER — Other Ambulatory Visit: Payer: Self-pay | Admitting: Family Medicine

## 2013-12-17 ENCOUNTER — Other Ambulatory Visit (HOSPITAL_COMMUNITY)
Admission: RE | Admit: 2013-12-17 | Discharge: 2013-12-17 | Disposition: A | Discharge status: Home / Self Care | Payer: 59 | Source: Ambulatory Visit | Attending: Family Medicine | Admitting: Family Medicine

## 2013-12-17 DIAGNOSIS — Z01419 Encounter for gynecological examination (general) (routine) without abnormal findings: Secondary | ICD-10-CM | POA: Insufficient documentation

## 2014-02-27 ENCOUNTER — Emergency Department (HOSPITAL_COMMUNITY)
Admission: EM | Admit: 2014-02-27 | Discharge: 2014-02-27 | Disposition: A | Discharge status: Home / Self Care | Payer: No Typology Code available for payment source | Attending: Emergency Medicine | Admitting: Emergency Medicine

## 2014-02-27 ENCOUNTER — Encounter (HOSPITAL_COMMUNITY): Payer: Self-pay | Admitting: Emergency Medicine

## 2014-02-27 DIAGNOSIS — S134XXA Sprain of ligaments of cervical spine, initial encounter: Secondary | ICD-10-CM

## 2014-02-27 DIAGNOSIS — S0990XA Unspecified injury of head, initial encounter: Secondary | ICD-10-CM | POA: Insufficient documentation

## 2014-02-27 DIAGNOSIS — Z880 Allergy status to penicillin: Secondary | ICD-10-CM | POA: Insufficient documentation

## 2014-02-27 DIAGNOSIS — Y92410 Unspecified street and highway as the place of occurrence of the external cause: Secondary | ICD-10-CM | POA: Insufficient documentation

## 2014-02-27 DIAGNOSIS — Y9389 Activity, other specified: Secondary | ICD-10-CM | POA: Insufficient documentation

## 2014-02-27 DIAGNOSIS — Z79899 Other long term (current) drug therapy: Secondary | ICD-10-CM | POA: Insufficient documentation

## 2014-02-27 DIAGNOSIS — S139XXA Sprain of joints and ligaments of unspecified parts of neck, initial encounter: Secondary | ICD-10-CM | POA: Insufficient documentation

## 2014-02-27 MED ORDER — HYDROCODONE-ACETAMINOPHEN 5-325 MG PO TABS: 1.0000 | ORAL_TABLET | Freq: Four times a day (QID) | ORAL | Status: DC | PRN

## 2014-02-27 MED ORDER — CYCLOBENZAPRINE HCL 10 MG PO TABS: 10.0000 mg | ORAL_TABLET | Freq: Two times a day (BID) | ORAL | Status: DC | PRN

## 2014-02-27 MED ORDER — NAPROXEN 500 MG PO TABS: 500.0000 mg | ORAL_TABLET | Freq: Two times a day (BID) | ORAL | Status: DC
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
## 2014-02-27 NOTE — ED Notes (Signed)
Pt nervous upset cautioned to slow respirations.  Jerking all over  tearful

## 2014-02-27 NOTE — ED Notes (Signed)
Neck pain
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
## 2014-03-01 NOTE — ED Provider Notes (Signed)
Medical screening examination/treatment/procedure(s) were performed by non-physician practitioner and as supervising physician I was immediately available for consultation/collaboration.   EKG Interpretation None        Blanchie Dessert, MD 03/01/14 1611

## 2014-06-29 ENCOUNTER — Encounter (HOSPITAL_COMMUNITY): Payer: Self-pay | Admitting: Emergency Medicine

## 2014-06-29 ENCOUNTER — Emergency Department (HOSPITAL_COMMUNITY): Payer: 59

## 2014-06-29 ENCOUNTER — Emergency Department (HOSPITAL_COMMUNITY)
Admission: EM | Admit: 2014-06-29 | Discharge: 2014-06-29 | Disposition: A | Discharge status: Home / Self Care | Payer: 59 | Attending: Emergency Medicine | Admitting: Emergency Medicine

## 2014-06-29 DIAGNOSIS — R42 Dizziness and giddiness: Secondary | ICD-10-CM | POA: Insufficient documentation

## 2014-06-29 DIAGNOSIS — Z79899 Other long term (current) drug therapy: Secondary | ICD-10-CM | POA: Insufficient documentation

## 2014-06-29 DIAGNOSIS — R55 Syncope and collapse: Secondary | ICD-10-CM | POA: Insufficient documentation

## 2014-06-29 DIAGNOSIS — Z791 Long term (current) use of non-steroidal anti-inflammatories (NSAID): Secondary | ICD-10-CM | POA: Insufficient documentation

## 2014-06-29 LAB — BASIC METABOLIC PANEL
Anion gap: 14 (ref 5–15)
BUN: 13 mg/dL (ref 6–23)
CO2: 25 mEq/L (ref 19–32)
CREATININE: 0.69 mg/dL (ref 0.50–1.10)
Calcium: 9.8 mg/dL (ref 8.4–10.5)
Chloride: 103 mEq/L (ref 96–112)
Glucose, Bld: 84 mg/dL (ref 70–99)
Potassium: 4.7 mEq/L (ref 3.7–5.3)
Sodium: 142 mEq/L (ref 137–147)

## 2014-06-29 LAB — CBC
HEMATOCRIT: 43.1 % (ref 36.0–46.0)
Hemoglobin: 13.9 g/dL (ref 12.0–15.0)
MCH: 29.1 pg (ref 26.0–34.0)
MCHC: 32.3 g/dL (ref 30.0–36.0)
MCV: 90.4 fL (ref 78.0–100.0)
Platelets: 436 10*3/uL — ABNORMAL HIGH (ref 150–400)
RBC: 4.77 MIL/uL (ref 3.87–5.11)
RDW: 13.6 % (ref 11.5–15.5)
WBC: 11.7 10*3/uL — ABNORMAL HIGH (ref 4.0–10.5)

## 2014-06-29 MED ORDER — ONDANSETRON 4 MG PO TBDP
8.0000 mg | ORAL_TABLET | Freq: Once | ORAL | Status: AC
  Administered 2014-06-29: 8 mg via ORAL
  Filled 2014-06-29: qty 2

## 2014-06-29 MED ORDER — MECLIZINE HCL 25 MG PO TABS
50.0000 mg | ORAL_TABLET | Freq: Once | ORAL | Status: AC
  Administered 2014-06-29: 50 mg via ORAL
  Filled 2014-06-29: qty 2

## 2014-06-29 MED ORDER — MECLIZINE HCL 25 MG PO TABS: 25.0000 mg | ORAL_TABLET | Freq: Four times a day (QID) | ORAL | Status: DC

## 2014-06-29 NOTE — ED Notes (Signed)
Pt reports walking at 1130 this AM when she had sudden onset of dizziness/lightheadedness with blurred vision. States she sat down and dizziness resolved but reports continued lightheadedness/blurred vision. Denies LOC. Neuro intact. AO x4. Denies weakness, numbness or confusion.

## 2014-06-29 NOTE — ED Notes (Signed)
Patient transported to CT 

## 2014-06-29 NOTE — ED Provider Notes (Signed)
CSN: [id number]     Arrival date & time 06/29/14  1214 History   First MD Initiated Contact with Patient 06/29/14 1247     Chief Complaint  Patient presents with  . Near Syncope     (Consider location/radiation/quality/duration/timing/severity/associated sxs/prior Treatment) HPI Comments: Pt here with sudden onset of dizziness with associated lightheadedness that began when she was standing--h/o similar sx in the past and possible dx with vertigo--some mild HA x 3 days--no focal weakness noted--sx better with head remaining stil, denies and syncope, no new meds or recent head injury--  Patient is a 53 y.o. female presenting with near-syncope. The history is provided by the patient.  Near Syncope    History reviewed. No pertinent past medical history. Past Surgical History  Procedure Laterality Date  . Laparoscopic tubal ligation    . Appendectomy    . Ectopic pregnancy surgery      x2  . Cesarean section     No family history on file. History  Substance Use Topics  . Smoking status: Never Smoker   . Smokeless tobacco: Not on file  . Alcohol Use: No   OB History   Grav Para Term Preterm Abortions TAB SAB Ect Mult Living                 Review of Systems  Cardiovascular: Positive for near-syncope.  All other systems reviewed and are negative.     Allergies  Ampicillin and Percocet  Home Medications   Prior to Admission medications   Medication Sig Start Date End Date Taking? Authorizing Provider  Cholecalciferol (VITAMIN D PO) Take 1 tablet by mouth daily.    Historical Provider, MD  cyclobenzaprine (FLEXERIL) 10 MG tablet Take 1 tablet (10 mg total) by mouth 2 (two) times daily as needed for muscle spasms. 02/27/14   Margarita Mail, PA-C  HYDROcodone-acetaminophen (NORCO) 5-325 MG per tablet Take 1-2 tablets by mouth every 6 (six) hours as needed for moderate pain. 02/27/14   Margarita Mail, PA-C  naproxen (NAPROSYN) 500 MG tablet Take 1 tablet (500 mg total) by  mouth 2 (two) times daily with a meal. 02/27/14   Abigail Harris, PA-C   BP 131/85  Pulse 70  Temp(Src) 98.2 F (36.8 C)  Resp 18  SpO2 100% Physical Exam  Nursing note and vitals reviewed. Constitutional: She is oriented to person, place, and time. She appears well-developed and well-nourished.  Non-toxic appearance. No distress.  HENT:  Head: Normocephalic and atraumatic.  Eyes: Conjunctivae, EOM and lids are normal. Pupils are equal, round, and reactive to light.  Neck: Normal range of motion. Neck supple. No tracheal deviation present. No mass present.  Cardiovascular: Normal rate, regular rhythm and normal heart sounds.  Exam reveals no gallop.   No murmur heard. Pulmonary/Chest: Effort normal and breath sounds normal. No stridor. No respiratory distress. She has no decreased breath sounds. She has no wheezes. She has no rhonchi. She has no rales.  Abdominal: Soft. Normal appearance and bowel sounds are normal. She exhibits no distension. There is no tenderness. There is no rebound and no CVA tenderness.  Musculoskeletal: Normal range of motion. She exhibits no edema and no tenderness.  Neurological: She is alert and oriented to person, place, and time. She has normal strength. No cranial nerve deficit or sensory deficit. GCS eye subscore is 4. GCS verbal subscore is 5. GCS motor subscore is 6.  Skin: Skin is warm and dry. No abrasion and no rash noted.  Psychiatric: She has  a normal mood and affect. Her speech is normal and behavior is normal.    ED Course  Procedures (including critical care time) Labs Review Labs Reviewed  CBC  BASIC METABOLIC PANEL    Imaging Review No results found.   EKG Interpretation   Date/Time:  Saturday June 29 2014 12:41:07 EDT Ventricular Rate:  70 PR Interval:  142 QRS Duration: 84 QT Interval:  410 QTC Calculation: 442 R Axis:   -8 Text Interpretation:  Normal sinus rhythm Normal ECG No significant change  since last tracing  Confirmed by Casy Tavano  MD, Demaree Liberto ([id number]) on 06/29/2014  3:43:13 PM      MDM   Final diagnoses:  None     Patient given medications for vertigo and feels better at this time.do not think that this is central vertigo. Patient stable for discharge and  Leota Jacobsen, MD 06/29/14 [phone]

## 2014-06-29 NOTE — Discharge Instructions (Signed)
Dizziness °Dizziness is a common problem. It is a feeling of unsteadiness or light-headedness. You may feel like you are about to faint. Dizziness can lead to injury if you stumble or fall. A person of any age group can suffer from dizziness, but dizziness is more common in older adults. °CAUSES  °Dizziness can be caused by many different things, including: °· Middle ear problems. °· Standing for too long. °· Infections. °· An allergic reaction. °· Aging. °· An emotional response to something, such as the sight of blood. °· Side effects of medicines. °· Tiredness. °· Problems with circulation or blood pressure. °· Excessive use of alcohol or medicines, or illegal drug use. °· Breathing too fast (hyperventilation). °· An irregular heart rhythm (arrhythmia). °· A low red blood cell count (anemia). °· Pregnancy. °· Vomiting, diarrhea, fever, or other illnesses that cause body fluid loss (dehydration). °· Diseases or conditions such as Parkinson's disease, high blood pressure (hypertension), diabetes, and thyroid problems. °· Exposure to extreme heat. °DIAGNOSIS  °Your health care provider will ask about your symptoms, perform a physical exam, and perform an electrocardiogram (ECG) to record the electrical activity of your heart. Your health care provider may also perform other heart or blood tests to determine the cause of your dizziness. These may include: °· Transthoracic echocardiogram (TTE). During echocardiography, sound waves are used to evaluate how blood flows through your heart. °· Transesophageal echocardiogram (TEE). °· Cardiac monitoring. This allows your health care provider to monitor your heart rate and rhythm in real time. °· Holter monitor. This is a portable device that records your heartbeat and can help diagnose heart arrhythmias. It allows your health care provider to track your heart activity for several days if needed. °· Stress tests by exercise or by giving medicine that makes the heart beat  faster. °TREATMENT  °Treatment of dizziness depends on the cause of your symptoms and can vary greatly. °HOME CARE INSTRUCTIONS  °· Drink enough fluids to keep your urine clear or pale yellow. This is especially important in very hot weather. In older adults, it is also important in cold weather. °· Take your medicine exactly as directed if your dizziness is caused by medicines. When taking blood pressure medicines, it is especially important to get up slowly. °¨ Rise slowly from chairs and steady yourself until you feel okay. °¨ In the morning, first sit up on the side of the bed. When you feel okay, stand slowly while holding onto something until you know your balance is fine. °· Move your legs often if you need to stand in one place for a long time. Tighten and relax your muscles in your legs while standing. °· Have someone stay with you for 1-2 days if dizziness continues to be a problem. Do this until you feel you are well enough to stay alone. Have the person call your health care provider if he or she notices changes in you that are concerning. °· Do not drive or use heavy machinery if you feel dizzy. °· Do not drink alcohol. °SEEK IMMEDIATE MEDICAL CARE IF:  °· Your dizziness or light-headedness gets worse. °· You feel nauseous or vomit. °· You have problems talking, walking, or using your arms, hands, or legs. °· You feel weak. °· You are not thinking clearly or you have trouble forming sentences. It may take a friend or family member to notice this. °· You have chest pain, abdominal pain, shortness of breath, or sweating. °· Your vision changes. °· You notice   any bleeding. °· You have side effects from medicine that seems to be getting worse rather than better. °MAKE SURE YOU:  °· Understand these instructions. °· Will watch your condition. °· Will get help right away if you are not doing well or get worse. °Document Released: 04/27/2001 Document Revised: 11/06/2013 Document Reviewed: 05/21/2011 °[employer]®  Patient Information ©2015 [employer]. This information is not intended to replace advice given to you by your health care provider. Make sure you discuss any questions you have with your health care provider. ° °Benign Positional Vertigo °Vertigo means you feel like you or your surroundings are moving when they are not. Benign positional vertigo is the most common form of vertigo. Benign means that the cause of your condition is not serious. Benign positional vertigo is more common in older adults. °CAUSES  °Benign positional vertigo is the result of an upset in the labyrinth system. This is an area in the middle ear that helps control your balance. This may be caused by a viral infection, head injury, or repetitive motion. However, often no specific cause is found. °SYMPTOMS  °Symptoms of benign positional vertigo occur when you move your head or eyes in different directions. Some of the symptoms may include: °· Loss of balance and falls. °· Vomiting. °· Blurred vision. °· Dizziness. °· Nausea. °· Involuntary eye movements (nystagmus). °DIAGNOSIS  °Benign positional vertigo is usually diagnosed by physical exam. If the specific cause of your benign positional vertigo is unknown, your caregiver may perform imaging tests, such as magnetic resonance imaging (MRI) or computed tomography (CT). °TREATMENT  °Your caregiver may recommend movements or procedures to correct the benign positional vertigo. Medicines such as meclizine, benzodiazepines, and medicines for nausea may be used to treat your symptoms. In rare cases, if your symptoms are caused by certain conditions that affect the inner ear, you may need surgery. °HOME CARE INSTRUCTIONS  °· Follow your caregiver's instructions. °· Move slowly. Do not make sudden body or head movements. °· Avoid driving. °· Avoid operating heavy machinery. °· Avoid performing any tasks that would be dangerous to you or others during a vertigo episode. °· Drink enough fluids to keep  your urine clear or pale yellow. °SEEK IMMEDIATE MEDICAL CARE IF:  °· You develop problems with walking, weakness, numbness, or using your arms, hands, or legs. °· You have difficulty speaking. °· You develop severe headaches. °· Your nausea or vomiting continues or gets worse. °· You develop visual changes. °· Your family or friends notice any behavioral changes. °· Your condition gets worse. °· You have a fever. °· You develop a stiff neck or sensitivity to light. °MAKE SURE YOU:  °· Understand these instructions. °· Will watch your condition. °· Will get help right away if you are not doing well or get worse. °Document Released: 08/09/2006 Document Revised: 01/24/2012 Document Reviewed: 07/22/2011 °Exit[employer]® Patient Information ©2015 [employer]. This information is not intended to replace advice given to you by your health care provider. Make sure you discuss any questions you have with your health care provider. ° °

## 2014-08-14 ENCOUNTER — Other Ambulatory Visit (HOSPITAL_COMMUNITY): Payer: Self-pay | Admitting: Family Medicine

## 2014-08-14 DIAGNOSIS — Z1231 Encounter for screening mammogram for malignant neoplasm of breast: Secondary | ICD-10-CM

## 2014-09-04 ENCOUNTER — Ambulatory Visit (HOSPITAL_COMMUNITY)
Admission: RE | Admit: 2014-09-04 | Discharge: 2014-09-04 | Disposition: A | Discharge status: Home / Self Care | Payer: 59 | Source: Ambulatory Visit | Attending: Family Medicine | Admitting: Family Medicine

## 2014-09-04 DIAGNOSIS — Z1231 Encounter for screening mammogram for malignant neoplasm of breast: Secondary | ICD-10-CM

## 2014-09-09 ENCOUNTER — Other Ambulatory Visit: Payer: Self-pay | Admitting: Family Medicine

## 2014-09-09 DIAGNOSIS — R928 Other abnormal and inconclusive findings on diagnostic imaging of breast: Secondary | ICD-10-CM

## 2014-09-24 ENCOUNTER — Ambulatory Visit
Admission: RE | Admit: 2014-09-24 | Discharge: 2014-09-24 | Disposition: A | Discharge status: Home / Self Care | Payer: 59 | Source: Ambulatory Visit | Attending: Family Medicine | Admitting: Family Medicine

## 2014-09-24 ENCOUNTER — Other Ambulatory Visit: Payer: 59

## 2014-09-24 DIAGNOSIS — R928 Other abnormal and inconclusive findings on diagnostic imaging of breast: Secondary | ICD-10-CM

## 2015-02-11 ENCOUNTER — Emergency Department (INDEPENDENT_AMBULATORY_CARE_PROVIDER_SITE_OTHER)
Admission: EM | Admit: 2015-02-11 | Discharge: 2015-02-11 | Disposition: A | Discharge status: Home / Self Care | Payer: Self-pay | Source: Home / Self Care | Attending: Family Medicine | Admitting: Family Medicine

## 2015-02-11 ENCOUNTER — Encounter (HOSPITAL_COMMUNITY): Payer: Self-pay | Admitting: *Deleted

## 2015-02-11 DIAGNOSIS — R002 Palpitations: Secondary | ICD-10-CM

## 2015-02-11 NOTE — ED Notes (Addendum)
C/o rapid heart rate.  Pulse 76.  No acute distress.  See physicians assessment. I did not see this pt. Hx. from notes.

## 2015-02-11 NOTE — Discharge Instructions (Signed)
Thank you for coming in today. Follow-up with cardiology. Please call or see [name] for assistance with your bill.  You may qualify for reduced or free services.  Her phone number is [phone]. Her email is [email or web address]  Call or go to the emergency room if you get worse, have trouble breathing, have chest pains, or palpitations.   Palpitations A palpitation is the feeling that your heartbeat is irregular or is faster than normal. It may feel like your heart is fluttering or skipping a beat. Palpitations are usually not a serious problem. However, in some cases, you may need further medical evaluation. CAUSES  Palpitations can be caused by:  Smoking.  Caffeine or other stimulants, such as diet pills or energy drinks.  Alcohol.  Stress and anxiety.  Strenuous physical activity.  Fatigue.  Certain medicines.  Heart disease, especially if you have a history of irregular heart rhythms (arrhythmias), such as atrial fibrillation, atrial flutter, or supraventricular tachycardia.  An improperly working pacemaker or defibrillator. DIAGNOSIS  To find the cause of your palpitations, your health care provider will take your medical history and perform a physical exam. Your health care provider may also have you take a test called an ambulatory electrocardiogram (ECG). An ECG records your heartbeat patterns over a 24-hour period. You may also have other tests, such as:  Transthoracic echocardiogram (TTE). During echocardiography, sound waves are used to evaluate how blood flows through your heart.  Transesophageal echocardiogram (TEE).  Cardiac monitoring. This allows your health care provider to monitor your heart rate and rhythm in real time.  Holter monitor. This is a portable device that records your heartbeat and can help diagnose heart arrhythmias. It allows your health care provider to track your heart activity for several days, if needed.  Stress  tests by exercise or by giving medicine that makes the heart beat faster. TREATMENT  Treatment of palpitations depends on the cause of your symptoms and can vary greatly. Most cases of palpitations do not require any treatment other than time, relaxation, and monitoring your symptoms. Other causes, such as atrial fibrillation, atrial flutter, or supraventricular tachycardia, usually require further treatment. HOME CARE INSTRUCTIONS   Avoid:  Caffeinated coffee, tea, soft drinks, diet pills, and energy drinks.  Chocolate.  Alcohol.  Stop smoking if you smoke.  Reduce your stress and anxiety. Things that can help you relax include:  A method of controlling things in your body, such as your heartbeats, with your mind (biofeedback).  Yoga.  Meditation.  Physical activity such as swimming, jogging, or walking.  Get plenty of rest and sleep. SEEK MEDICAL CARE IF:   You continue to have a fast or irregular heartbeat beyond 24 hours.  Your palpitations occur more often. SEEK IMMEDIATE MEDICAL CARE IF:  You have chest pain or shortness of breath.  You have a severe headache.  You feel dizzy or you faint. MAKE SURE YOU:  Understand these instructions.  Will watch your condition.  Will get help right away if you are not doing well or get worse. Document Released: 10/29/2000 Document Revised: 11/06/2013 Document Reviewed: 12/31/2011 [facility] Patient Information 2015 [place], [place]. This information is not intended to replace advice given to you by your health care provider. Make sure you discuss any questions you have with your health care provider.

## 2015-02-11 NOTE — ED Provider Notes (Signed)
Melissa Harding is a 54 y.o. female who presents to Urgent Care today for Palpitations. Patient feels her heart beating at night. This is interfering with her ability to sleep. This is been present for about 3 weeks. No chest pains or near syncope or significant shortness of breath. She has not tried any medications yet. She feels well otherwise.   No past medical history on file. Past Surgical History  Procedure Laterality Date  . Laparoscopic tubal ligation    . Appendectomy    . Ectopic pregnancy surgery      x2  . Cesarean section     History  Substance Use Topics  . Smoking status: Never Smoker   . Smokeless tobacco: Not on file  . Alcohol Use: No   ROS as above Medications: No current facility-administered medications for this encounter.   Current Outpatient Prescriptions  Medication Sig Dispense Refill  . meclizine (ANTIVERT) 25 MG tablet Take 1 tablet (25 mg total) by mouth 4 (four) times daily. 28 tablet 0   Allergies  Allergen Reactions  . Ampicillin     REACTION: seizures  . Percocet [Oxycodone-Acetaminophen] Nausea Only     Exam:  BP 132/76 mmHg  Pulse 76  Temp(Src) 98 F (36.7 C) (Oral)  Resp 12  SpO2 100%  Orthostatic VS for the past 24 hrs:  BP- Lying Pulse- Lying BP- Sitting Pulse- Sitting BP- Standing at 0 minutes Pulse- Standing at 0 minutes  02/11/15 1932 116/77 mmHg 73 123/80 mmHg 80 111/77 mmHg 86    Gen: Well NAD HEENT: EOMI,  MMM Lungs: Normal work of breathing. CTABL Heart: RRR no MRG Abd: NABS, Soft. Nondistended, Nontender Exts: Brisk capillary refill, warm and well perfused.   ED ECG REPORT   Date: 02/11/2015  Rate: 77  Rhythm: normal sinus rhythm  QRS Axis: normal  Intervals: normal  ST/T Wave abnormalities: normal  Conduction Disutrbances:none  Narrative Interpretation:   Old EKG Reviewed: unchanged  I have personally reviewed the EKG tracing and agree with the computerized printout as noted.   No results found for this  or any previous visit (from the past 24 hour(s)). No results found.  Assessment and Plan: 54 y.o. female with Palpitations. Normal currently. Follow-up with cardiology for Holter monitor. Return as needed.  Discussed warning signs or symptoms. Please see discharge instructions. Patient expresses understanding.     Gregor Hams, MD 02/11/15 2103

## 2015-03-13 ENCOUNTER — Encounter: Payer: Self-pay | Admitting: Cardiovascular Disease

## 2015-03-13 ENCOUNTER — Ambulatory Visit (INDEPENDENT_AMBULATORY_CARE_PROVIDER_SITE_OTHER): Payer: Self-pay | Admitting: Cardiovascular Disease

## 2015-03-13 VITALS — BP 110/74 | HR 84 | Ht 62.0 in | Wt 205.8 lb

## 2015-03-13 DIAGNOSIS — R002 Palpitations: Secondary | ICD-10-CM

## 2015-03-13 NOTE — Progress Notes (Signed)
Patient ID: Melissa Harding, female   DOB: [birth year], 54 y.o.   MRN: [id number]     Cardiology Office Note   Date:  03/13/2015   ID:  Melissa Harding, DOB 1960-12-02, MRN [id number]  PCP:  Milagros Evener, MD  Cardiologist:   Jenkins Rouge, MD   Chief Complaint  Patient presents with  . New Evaluation    Palpitations      History of Present Illness: Melissa Harding is a 54 y.o. female who presents for palpitations  Seen in ER 02/11/15  Negative w/u  Note from nurse indicates patient complained of palpitations And telemetry showed NSR rates 70's.  Been going on for about 3 weeks.  Mostly at night keeping her from sleeping  She has been under a lot of stress dealing with Son's daughter He just got custody of her and she has "lots of psychological issues"  Palpitations coincide with this stress and some weight gain.  No chest pain Syncope or high risk family history     History reviewed. No pertinent past medical history.  Past Surgical History  Procedure Laterality Date  . Laparoscopic tubal ligation    . Appendectomy    . Ectopic pregnancy surgery      x2  . Cesarean section       No current outpatient prescriptions on file.   No current facility-administered medications for this visit.    Allergies:   Ampicillin and Percocet    Social History:  The patient  reports that she has never smoked. She does not have any smokeless tobacco history on file. She reports that she does not drink alcohol or use illicit drugs.   Family History:  The patient's family history includes Heart attack in her paternal grandfather.    ROS:  Please see the history of present illness.   Otherwise, review of systems are positive for none.   All other systems are reviewed and negative.    PHYSICAL EXAM: VS:  Ht 5\' 2"  (1.575 m)  Wt 205 lb 12.8 oz (93.35 kg)  BMI 37.63 kg/m2 , BMI Body mass index is 37.63 kg/(m^2). Affect appropriate Healthy:  appears stated age 51: normal Neck supple  with no adenopathy JVP normal no bruits no thyromegaly Lungs clear with no wheezing and good diaphragmatic motion Heart:  S1/S2 no murmur, no rub, gallop or click PMI normal Abdomen: benighn, BS positve, no tenderness, no AAA no bruit.  No HSM or HJR Distal pulses intact with no bruits No edema Neuro non-focal Skin warm and dry No muscular weakness    EKG:   02/11/15  SR rate 77 normal    Recent Labs: 06/29/2014: BUN 13; Creatinine 0.69; Hemoglobin 13.9; Platelets 436*; Potassium 4.7; Sodium 142    Lipid Panel    Component Value Date/Time   CHOL 160 03/05/2009 2204   TRIG 73 03/05/2009 2204   HDL 55 03/05/2009 2204   CHOLHDL 2.9 Ratio 03/05/2009 2204   VLDL 15 03/05/2009 2204   LDLCALC 90 03/05/2009 2204      Wt Readings from Last 3 Encounters:  03/13/15 205 lb 12.8 oz (93.35 kg)  02/27/14 207 lb (93.895 kg)  11/01/11 194 lb (87.998 kg)      Other studies Reviewed: Additional studies/ records that were reviewed today include:  Epic notes .    ASSESSMENT AND PLAN:  1.  Palpitations:  Benign related to stress negative ER telemetry normal ECG and exam  Echo to r/o structural heart disease.  Left it  up To patient as to wether she wants event monitor in future.  Discussed relationship to stress and weight gain  F/U Dr Zadie Rhine as primary   Current medicines are reviewed at length with the patient today.  The patient does not have concerns regarding medicines.  The following changes have been made:  no change  Labs/ tests ordered today include: Echo   No orders of the defined types were placed in this encounter.     Disposition:   FU with  Cardiology PRN     Signed, Jenkins Rouge, MD  03/13/2015 2:09 PM    [facility] [address], [place], [place]  [address] Phone: [phone]; Fax: [phone]

## 2015-03-13 NOTE — Patient Instructions (Signed)
Medication Instructions:  NO CHANGES  Labwork: NONE  Testing/Procedures: Your physician has requested that you have an echocardiogram. Echocardiography is a painless test that uses sound waves to create images of your heart. It provides your doctor with information about the size and shape of your heart and how well your heart's chambers and valves are working. This procedure takes approximately one hour. There are no restrictions for this procedure.   Follow-Up: AS NEEDED  Any Other Special Instructions Will Be Listed Below (If Applicable). \ 

## 2015-03-19 ENCOUNTER — Ambulatory Visit (HOSPITAL_COMMUNITY): Payer: 59 | Attending: Cardiovascular Disease

## 2015-03-19 ENCOUNTER — Other Ambulatory Visit: Payer: Self-pay

## 2015-03-19 DIAGNOSIS — R002 Palpitations: Secondary | ICD-10-CM | POA: Diagnosis not present

## 2016-04-14 ENCOUNTER — Other Ambulatory Visit: Payer: Self-pay | Admitting: Family Medicine

## 2016-04-14 DIAGNOSIS — Z1231 Encounter for screening mammogram for malignant neoplasm of breast: Secondary | ICD-10-CM

## 2016-04-22 ENCOUNTER — Ambulatory Visit
Admission: RE | Admit: 2016-04-22 | Discharge: 2016-04-22 | Disposition: A | Discharge status: Home / Self Care | Payer: No Typology Code available for payment source | Source: Ambulatory Visit | Attending: Family Medicine | Admitting: Family Medicine

## 2016-04-22 DIAGNOSIS — Z1231 Encounter for screening mammogram for malignant neoplasm of breast: Secondary | ICD-10-CM

## 2016-12-11 ENCOUNTER — Ambulatory Visit (HOSPITAL_COMMUNITY)
Admission: EM | Admit: 2016-12-11 | Discharge: 2016-12-11 | Disposition: A | Discharge status: Home / Self Care | Payer: No Typology Code available for payment source | Attending: Family Medicine | Admitting: Family Medicine

## 2016-12-11 ENCOUNTER — Encounter (HOSPITAL_COMMUNITY): Payer: Self-pay | Admitting: Emergency Medicine

## 2016-12-11 DIAGNOSIS — B9789 Other viral agents as the cause of diseases classified elsewhere: Secondary | ICD-10-CM

## 2016-12-11 DIAGNOSIS — J069 Acute upper respiratory infection, unspecified: Secondary | ICD-10-CM

## 2016-12-11 MED ORDER — BENZONATATE 100 MG PO CAPS: 100.0000 mg | ORAL_CAPSULE | Freq: Three times a day (TID) | ORAL | 0 refills | Status: AC | PRN

## 2016-12-11 NOTE — ED Triage Notes (Signed)
Weakness, body aches, and head ache in addition to s/s documented

## 2016-12-11 NOTE — ED Provider Notes (Signed)
CSN: [id number]     Arrival date & time 12/11/16  1537 History   First MD Initiated Contact with Patient 12/11/16 1724     Chief Complaint  Patient presents with  . URI   (Consider location/radiation/quality/duration/timing/severity/associated sxs/prior Treatment) 2 days.  Been taking tylenol severe flu with no relief.    The history is provided by the patient.  URI  Presenting symptoms: congestion, cough, facial pain, fatigue, rhinorrhea and sore throat   Presenting symptoms: no ear pain and no fever   Congestion:    Location:  Nasal   Interferes with sleep: yes     Interferes with eating/drinking: yes   Cough:    Cough characteristics:  Dry Rhinorrhea:    Quality:  Clear   Progression:  Worsening Associated symptoms: sinus pain   Associated symptoms: no headaches and no wheezing     History reviewed. No pertinent past medical history. Past Surgical History:  Procedure Laterality Date  . APPENDECTOMY    . CESAREAN SECTION    . ECTOPIC PREGNANCY SURGERY     x2  . LAPAROSCOPIC TUBAL LIGATION     Family History  Problem Relation Age of Onset  . Heart attack Paternal Grandfather    Social History  Substance Use Topics  . Smoking status: Never Smoker  . Smokeless tobacco: Not on file  . Alcohol use No   OB History    No data available     Review of Systems  Constitutional: Positive for fatigue. Negative for fever.       As stated in the HPI  HENT: Positive for congestion, rhinorrhea, sinus pain and sore throat. Negative for ear pain.   Respiratory: Positive for cough. Negative for shortness of breath and wheezing.   Cardiovascular: Negative for chest pain.  Gastrointestinal: Negative for abdominal pain and nausea.  Neurological: Negative for dizziness and headaches.    Allergies  Ampicillin and Percocet [oxycodone-acetaminophen]  Home Medications   Prior to Admission medications   Medication Sig Start Date End Date Taking? Authorizing Provider   benzonatate (TESSALON) 100 MG capsule Take 1 capsule (100 mg total) by mouth 3 (three) times daily as needed for cough. 12/11/16 12/18/16  Barry Dienes, NP   Meds Ordered and Administered this Visit  Medications - No data to display  BP 118/74 (BP Location: Left Arm)   Pulse 96   Temp 98.6 F (37 C) (Oral)   Resp 18   SpO2 97%  No data found.   Physical Exam  Constitutional: She appears well-developed and well-nourished.  HENT:  Head: Normocephalic.  Right Ear: External ear normal.  Left Ear: External ear normal.  Nose: Nose normal.  Mouth/Throat: Oropharynx is clear and moist. No oropharyngeal exudate.  TM normal bilaterally  Eyes: EOM are normal. Pupils are equal, round, and reactive to light.  Cardiovascular: Normal rate, regular rhythm and normal heart sounds.   No murmur heard. Pulmonary/Chest: Effort normal and breath sounds normal. No respiratory distress. She has no wheezes.  Abdominal: Soft. Bowel sounds are normal. She exhibits no distension. There is no tenderness.  Skin: Skin is warm and dry.  Nursing note and vitals reviewed.   Urgent Care Course     Procedures (including critical care time)  Labs Review Labs Reviewed - No data to display  Imaging Review No results found.  MDM   1. Viral URI with cough    The patient's signs and symptoms are most consistent with a diagnosis of URI. Patient educated that antibiotic  would not be helpful since this is a viral illness. Treatment is supportive. RX for tessalon pearl given for cough; may also use delsym or robitussin OTC. Patient encouraged to rest, drink plenty of fluid, use Motrin/Tylenol as needed at appropriate dose for pain/fever.   Barry Dienes, NP 12/11/16 1735

## 2017-07-31 ENCOUNTER — Ambulatory Visit (HOSPITAL_COMMUNITY)
Admission: EM | Admit: 2017-07-31 | Discharge: 2017-07-31 | Disposition: A | Discharge status: Home / Self Care | Payer: No Typology Code available for payment source | Attending: Family Medicine | Admitting: Family Medicine

## 2017-07-31 ENCOUNTER — Encounter (HOSPITAL_COMMUNITY): Payer: Self-pay | Admitting: *Deleted

## 2017-07-31 DIAGNOSIS — R42 Dizziness and giddiness: Secondary | ICD-10-CM

## 2017-07-31 DIAGNOSIS — H6593 Unspecified nonsuppurative otitis media, bilateral: Secondary | ICD-10-CM

## 2017-07-31 MED ORDER — NEOMYCIN-POLYMYXIN-HC 3.5-10000-1 OT SUSP: 4.0000 [drp] | Freq: Three times a day (TID) | OTIC | 1 refills | Status: DC

## 2017-07-31 MED ORDER — MECLIZINE HCL 12.5 MG PO TABS: 12.5000 mg | ORAL_TABLET | Freq: Three times a day (TID) | ORAL | 0 refills | Status: DC | PRN

## 2017-07-31 MED ORDER — CIPROFLOXACIN HCL 500 MG PO TABS: 500.0000 mg | ORAL_TABLET | Freq: Two times a day (BID) | ORAL | 0 refills | Status: DC

## 2017-07-31 NOTE — ED Provider Notes (Signed)
[place]   [id number] 07/31/17 Arrival Time: 7829   SUBJECTIVE:  Melissa Harding is a 56 y.o. female who presents to the urgent care with complaint of ear pain bilaterally for three days associated with some vertigo and drainage.   Nasal passages feel very dry. No fever Some hearing loss  Patient is a caretaker of sick folks.     History reviewed. No pertinent past medical history. Family History  Problem Relation Age of Onset  . Heart attack Paternal Grandfather    Social History   Social History  . Marital status: Married    Spouse name: steven  . Number of children: 2  . Years of education: 12   Occupational History  . part time     self employed   Social History Main Topics  . Smoking status: Never Smoker  . Smokeless tobacco: Not on file  . Alcohol use No  . Drug use: No  . Sexual activity: Yes    Birth control/ protection: Condom   Other Topics Concern  . Not on file   Social History Narrative  . No narrative on file   No outpatient prescriptions have been marked as taking for the 07/31/17 encounter [facility] Encounter).   Allergies  Allergen Reactions  . Ampicillin     REACTION: seizures  . Percocet [Oxycodone-Acetaminophen] Nausea Only      ROS: As per HPI, remainder of ROS negative.   OBJECTIVE:   Vitals:   07/31/17 1210  BP: 128/72  Pulse: 78  Resp: 18  Temp: 98.6 F (37 C)  TempSrc: Oral  SpO2: 100%     General appearance: alert; no distress Eyes: PERRL; EOMI; conjunctiva normal HENT: normocephalic; atraumatic; TM's dull and canals wet with exudates, external ears normal without trauma; nasal mucosa normal; oral mucosa normal Neck: supple Lungs: clear to auscultation bilaterally Heart: regular rate and rhythm Back: no CVA tenderness Extremities: no cyanosis or edema; symmetrical with no gross deformities Skin: warm and dry Neurologic: normal gait; grossly normal Psychological: alert and cooperative; normal  mood and affect      Labs:  Results for orders placed or performed during the hospital encounter of 06/29/14  CBC  (at [facility])  Result Value Ref Range   WBC 11.7 (H) 4.0 - 10.5 K/uL   RBC 4.77 3.87 - 5.11 MIL/uL   Hemoglobin 13.9 12.0 - 15.0 g/dL   HCT 43.1 36.0 - 46.0 %   MCV 90.4 78.0 - 100.0 fL   MCH 29.1 26.0 - 34.0 pg   MCHC 32.3 30.0 - 36.0 g/dL   RDW 13.6 11.5 - 15.5 %   Platelets 436 (H) 150 - 400 K/uL  Basic metabolic panel  (at [facility])  Result Value Ref Range   Sodium 142 137 - 147 mEq/L   Potassium 4.7 3.7 - 5.3 mEq/L   Chloride 103 96 - 112 mEq/L   CO2 25 19 - 32 mEq/L   Glucose, Bld 84 70 - 99 mg/dL   BUN 13 6 - 23 mg/dL   Creatinine, Ser 0.69 0.50 - 1.10 mg/dL   Calcium 9.8 8.4 - 10.5 mg/dL   GFR calc non Af Amer >90 >90 mL/min   GFR calc Af Amer >90 >90 mL/min   Anion gap 14 5 - 15    Labs Reviewed - No data to display  No results found.     ASSESSMENT & PLAN:  1. Bilateral non-suppurative otitis media   2.  Dizziness and giddiness     Meds ordered this encounter  Medications  . ciprofloxacin (CIPRO) 500 MG tablet    Sig: Take 1 tablet (500 mg total) by mouth 2 (two) times daily.    Dispense:  14 tablet    Refill:  0  . neomycin-polymyxin-hydrocortisone (CORTISPORIN) 3.5-10000-1 OTIC suspension    Sig: Place 4 drops into both ears 3 (three) times daily.    Dispense:  10 mL    Refill:  1  . meclizine (ANTIVERT) 12.5 MG tablet    Sig: Take 1 tablet (12.5 mg total) by mouth 3 (three) times daily as needed for dizziness.    Dispense:  30 tablet    Refill:  0    Reviewed expectations re: course of current medical issues. Questions answered. Outlined signs and symptoms indicating need for more acute intervention. Patient verbalized understanding. After Visit Summary given.       Robyn Haber, MD 07/31/17 1218

## 2017-07-31 NOTE — ED Triage Notes (Signed)
Pt  Reports   Bilateral   Ear  Pain      For    3-4  Days          Rep[orts   Drainage   As  Well  From the  Affected   Ears

## 2017-09-02 ENCOUNTER — Other Ambulatory Visit: Payer: Self-pay | Admitting: Obstetrics and Gynecology

## 2017-09-02 DIAGNOSIS — Z1231 Encounter for screening mammogram for malignant neoplasm of breast: Secondary | ICD-10-CM

## 2017-09-13 ENCOUNTER — Ambulatory Visit (HOSPITAL_COMMUNITY): Payer: Self-pay

## 2017-10-18 ENCOUNTER — Encounter (HOSPITAL_COMMUNITY): Payer: Self-pay

## 2017-10-18 ENCOUNTER — Ambulatory Visit
Admission: RE | Admit: 2017-10-18 | Discharge: 2017-10-18 | Disposition: A | Discharge status: Home / Self Care | Payer: No Typology Code available for payment source | Source: Ambulatory Visit | Attending: Obstetrics and Gynecology | Admitting: Obstetrics and Gynecology

## 2017-10-18 ENCOUNTER — Ambulatory Visit (HOSPITAL_COMMUNITY)
Admission: RE | Admit: 2017-10-18 | Discharge: 2017-10-18 | Disposition: A | Discharge status: Home / Self Care | Payer: Self-pay | Source: Ambulatory Visit | Attending: Obstetrics and Gynecology | Admitting: Obstetrics and Gynecology

## 2017-10-18 VITALS — BP 110/64 | Temp 98.1°F | Ht 62.0 in | Wt 194.6 lb

## 2017-10-18 DIAGNOSIS — N898 Other specified noninflammatory disorders of vagina: Secondary | ICD-10-CM

## 2017-10-18 DIAGNOSIS — Z01419 Encounter for gynecological examination (general) (routine) without abnormal findings: Secondary | ICD-10-CM

## 2017-10-18 DIAGNOSIS — Z1231 Encounter for screening mammogram for malignant neoplasm of breast: Secondary | ICD-10-CM

## 2017-10-18 NOTE — Patient Instructions (Signed)
Explained breast self awareness with [name]. Let patient know [facility] will cover Pap smears and HPV typing every 5 years unless has a history of abnormal Pap smears. Referred patient to the [facility] for a screening mammogram. Appointment scheduled for Tuesday, October 18, 2017 at [facility]. Let patient know will follow up with her within the next week with results of Pap smear and wet prep by phone. Informed patient that the [facility] will follow up with her within the next couple of weeks with results of mammogram by letter or phone. [name] verbalized understanding.  Mohsen Odenthal, Arvil Chaco, RN 9:18 AM

## 2017-10-18 NOTE — Progress Notes (Signed)
No complaints today.   Pap Smear: Pap smear completed today. Last Pap smear was 12/17/2013 at [facility] and normal. Per patient has no history of an abnormal Pap smear. Last Pap smear result is in Epic.  Physical exam: Breasts Breasts symmetrical. No skin abnormalities bilateral breasts. No nipple retraction bilateral breasts. No nipple discharge bilateral breasts. No lymphadenopathy. No lumps palpated bilateral breasts. No complaints of pain or tenderness on exam. Referred patient to the [facility] for a screening mammogram. Appointment scheduled for Tuesday, October 18, 2017 at [facility].  Pelvic/Bimanual   Ext Genitalia No lesions, no swelling and no discharge observed on external genitalia.         Vagina Vagina pink and normal texture. No lesions and thick white discharge observed in vagina. Wet prep completed.          Cervix Cervix is present. Cervix pink and of normal texture. Thick white discharge observed on cervix.    Uterus Uterus is present and palpable. Uterus in normal position and normal size.        Adnexae Bilateral ovaries present and palpable. No tenderness on palpation.          Rectovaginal No rectal exam completed today since patient had no rectal complaints. No skin abnormalities observed on exam.    Smoking History: Patient has never smoked.  Patient Navigation: Patient education provided. Access to services provided for patient through [facility] program.   Colorectal Cancer Screening: Per patient has never had a colonoscopy completed. No complaints today. FIT Test given to patient to complete and return to [facility].

## 2017-10-19 ENCOUNTER — Encounter (HOSPITAL_COMMUNITY): Payer: Self-pay | Admitting: *Deleted

## 2017-10-19 LAB — CYTOLOGY - PAP
Bacterial vaginitis: POSITIVE — AB
CANDIDA VAGINITIS: NEGATIVE
Diagnosis: NEGATIVE
HPV (WINDOPATH): NOT DETECTED
TRICH (WINDOWPATH): NEGATIVE

## 2017-10-20 ENCOUNTER — Other Ambulatory Visit (HOSPITAL_COMMUNITY): Payer: Self-pay | Admitting: *Deleted

## 2017-10-20 ENCOUNTER — Telehealth (HOSPITAL_COMMUNITY): Payer: Self-pay | Admitting: *Deleted

## 2017-10-20 DIAGNOSIS — N760 Acute vaginitis: Principal | ICD-10-CM

## 2017-10-20 DIAGNOSIS — B9689 Other specified bacterial agents as the cause of diseases classified elsewhere: Secondary | ICD-10-CM

## 2017-10-20 MED ORDER — METRONIDAZOLE 500 MG PO TABS: 500.0000 mg | ORAL_TABLET | Freq: Two times a day (BID) | ORAL | 0 refills | Status: DC

## 2017-10-20 NOTE — Telephone Encounter (Signed)
Telephoned patient at home number and advised patient of negative pap smear results. HPV was negative. Next pap smear due in five years. Pap did show bacterial vaginosis. Advised patient medication was called into pharmacy. Advised patient to finish all medication and no alcohol while taking medication, Patient voiced understanding.

## 2018-01-11 ENCOUNTER — Other Ambulatory Visit: Payer: Self-pay

## 2018-01-11 ENCOUNTER — Ambulatory Visit (HOSPITAL_COMMUNITY)
Admission: EM | Admit: 2018-01-11 | Discharge: 2018-01-11 | Disposition: A | Discharge status: Home / Self Care | Payer: BLUE CROSS/BLUE SHIELD | Attending: Family Medicine | Admitting: Family Medicine

## 2018-01-11 ENCOUNTER — Encounter (HOSPITAL_COMMUNITY): Payer: Self-pay | Admitting: Emergency Medicine

## 2018-01-11 DIAGNOSIS — L309 Dermatitis, unspecified: Secondary | ICD-10-CM | POA: Diagnosis not present

## 2018-01-11 MED ORDER — PREDNISONE 10 MG (21) PO TBPK: ORAL_TABLET | Freq: Every day | ORAL | 0 refills | Status: DC

## 2018-01-11 NOTE — ED Triage Notes (Signed)
Pt traveled to [place] on the 16th of this month, while there noticed a rash on both lower arms, states she came back and its spread to her upper arms. Denies pain, but endorses itchiness.

## 2018-01-18 NOTE — ED Provider Notes (Signed)
  [place]   [id number] 01/11/18 Arrival Time: 1700  ASSESSMENT & PLAN:  1. Dermatitis   -exact etiology/trigger unknown  Meds ordered this encounter  Medications  . predniSONE (STERAPRED UNI-PAK 21 TAB) 10 MG (21) TBPK tablet    Sig: Take by mouth daily. Take as directed.    Dispense:  21 tablet    Refill:  0   Will f/u here if not seeing improvement over the next few days.  Reviewed expectations re: course of current medical issues. Questions answered. Outlined signs and symptoms indicating need for more acute intervention. Patient verbalized understanding. After Visit Summary given.   SUBJECTIVE:  Melissa Harding is a 57 y.o. female who presents with complaint of:   Rash Patient presents for evaluation of a rash involving her bilateral arms. Recent travel to [place]. Rash began shortly after returning. Itching. No pain. Afebrile. No new exposures. No specific aggravating or alleviating factors reported. OTC creams without much relief. No h/o similar.   ROS: As per HPI.  OBJECTIVE: Vitals:   01/11/18 1746  BP: 132/79  Pulse: 77  Resp: 18  Temp: 97.8 F (36.6 C)  SpO2: 97%    General appearance: alert; no distress Lungs: clear to auscultation bilaterally Heart: regular rate and rhythm Extremities: no edema Skin: warm and dry; bilateral  Inner arms with diffuse dry skin, slightly raised areas, overall mildly erythematous; no vesicles/bullae Psychological: alert and cooperative; normal mood and affect  Allergies  Allergen Reactions  . Ampicillin     REACTION: seizures  . Percocet [Oxycodone-Acetaminophen] Nausea Only    History reviewed. No pertinent past medical history. Social History   Socioeconomic History  . Marital status: Married    Spouse name: steven  . Number of children: 2  . Years of education: 25  . Highest education level: Not on file  Social Needs  . Financial resource strain: Not on file  . Food insecurity - worry: Not on  file  . Food insecurity - inability: Not on file  . Transportation needs - medical: Not on file  . Transportation needs - non-medical: Not on file  Occupational History  . Occupation: part time    Comment: self employed  Tobacco Use  . Smoking status: Never Smoker  . Smokeless tobacco: Never Used  Substance and Sexual Activity  . Alcohol use: Yes    Comment: rarely a glass of wine  . Drug use: No  . Sexual activity: Yes    Birth control/protection: Surgical  Other Topics Concern  . Not on file  Social History Narrative  . Not on file   Family History  Problem Relation Age of Onset  . Hypertension Mother   . Hypertension Father   . Cancer Paternal Grandmother   . Heart attack Paternal Grandfather    Past Surgical History:  Procedure Laterality Date  . APPENDECTOMY    . CESAREAN SECTION     2 previous  . ECTOPIC PREGNANCY SURGERY     x2  . LAPAROSCOPIC TUBAL LIGATION    . TUBAL LIGATION       Vanessa Kick, MD 01/18/18 [phone]

## 2018-06-04 ENCOUNTER — Encounter (HOSPITAL_COMMUNITY): Payer: Self-pay | Admitting: Emergency Medicine

## 2018-06-04 ENCOUNTER — Other Ambulatory Visit: Payer: Self-pay

## 2018-06-04 ENCOUNTER — Ambulatory Visit (HOSPITAL_COMMUNITY)
Admission: EM | Admit: 2018-06-04 | Discharge: 2018-06-04 | Disposition: A | Discharge status: Home / Self Care | Payer: BLUE CROSS/BLUE SHIELD | Attending: Family Medicine | Admitting: Family Medicine

## 2018-06-04 DIAGNOSIS — H66001 Acute suppurative otitis media without spontaneous rupture of ear drum, right ear: Secondary | ICD-10-CM

## 2018-06-04 DIAGNOSIS — H6122 Impacted cerumen, left ear: Secondary | ICD-10-CM

## 2018-06-04 MED ORDER — FLUTICASONE PROPIONATE 50 MCG/ACT NA SUSP: 2.0000 | Freq: Every day | NASAL | 0 refills | Status: DC

## 2018-06-04 MED ORDER — AZITHROMYCIN 250 MG PO TABS: 250.0000 mg | ORAL_TABLET | Freq: Every day | ORAL | 0 refills | Status: DC

## 2018-06-04 NOTE — Discharge Instructions (Signed)
Start azithromycin for otitis media.  As discussed, this could be caused by eustachian tube dysfunction, start Flonase as directed.  You  can take Tylenol/Motrin for pain or fever.  Follow-up for reevaluation if symptoms not improving.

## 2018-06-04 NOTE — ED Provider Notes (Signed)
[place]    CSN: [id number] Arrival date & time: 06/04/18  1140     History   Chief Complaint Chief Complaint  Patient presents with  . Foreign Body in [place] is a 57 y.o. female.   57 year old female comes in for possible foreign body in ear for 3 days.  States had ear pain with fullness, thought it was due to wax, and used a cotton swab.  States now with worsening fullness, "clogged" feeling, and wonders if the cotton swab got stuck.  She got hydrogen peroxide to see if it would help with earwax, but without improvement.  She denies any URI symptoms such as cough, congestion, sore throat.  Denies fever, chills, night sweats.  Denies recent travel, swimming.     History reviewed. No pertinent past medical history.  Patient Active Problem List   Diagnosis Date Noted  . MENOPAUSAL SYNDROME 03/05/2009  . MUSCLE SPASM 03/05/2009  . DYSPHAGIA UNSPECIFIED 03/05/2009  . TOBACCO ABUSE 03/05/2008  . GERD 10/14/2004    Past Surgical History:  Procedure Laterality Date  . APPENDECTOMY    . CESAREAN SECTION     2 previous  . ECTOPIC PREGNANCY SURGERY     x2  . LAPAROSCOPIC TUBAL LIGATION    . TUBAL LIGATION      OB History    Gravida  6   Para      Term      Preterm      AB  4   Living  2     SAB      TAB  2   Ectopic  2   Multiple      Live Births  2            Home Medications    Prior to Admission medications   Medication Sig Start Date End Date Taking? Authorizing Provider  azithromycin (ZITHROMAX) 250 MG tablet Take 1 tablet (250 mg total) by mouth daily. Take first 2 tablets together, then 1 every day until finished. 06/04/18   Tasia Catchings, Cordelle Dahmen V, PA-C  fluticasone (FLONASE) 50 MCG/ACT nasal spray Place 2 sprays into both nostrils daily. 06/04/18   Ok Edwards, PA-C    Family History Family History  Problem Relation Age of Onset  . Hypertension Mother   . Hypertension Father   . Cancer Paternal Grandmother   .  Heart attack Paternal Grandfather     Social History Social History   Tobacco Use  . Smoking status: Never Smoker  . Smokeless tobacco: Never Used  Substance Use Topics  . Alcohol use: Yes    Comment: rarely a glass of wine  . Drug use: No     Allergies   Ampicillin and Percocet [oxycodone-acetaminophen]   Review of Systems Review of Systems  Reason unable to perform ROS: See HPI as above.     Physical Exam Triage Vital Signs ED Triage Vitals [06/04/18 1159]  Enc Vitals Group     BP 128/76     Pulse Rate 75     Resp 18     Temp 97.8 F (36.6 C)     Temp Source Oral     SpO2 99 %     Weight      Height      Head Circumference      Peak Flow      Pain Score 0     Pain Loc      Pain  Edu?      Excl. in [place]?    No data found.  Updated Vital Signs BP 128/76 (BP Location: Left Arm)   Pulse 75   Temp 97.8 F (36.6 C) (Oral)   Resp 18   SpO2 99%   Physical Exam  Constitutional: She is oriented to person, place, and time. She appears well-developed and well-nourished. No distress.  HENT:  Head: Normocephalic and atraumatic.  Right Ear: Tympanic membrane and external ear normal.  Left Ear: Hearing, external ear and ear canal normal. Tympanic membrane is not erythematous and not bulging.  Cerumen impaction, TM not visible.  Post ear irrigation, patient with erythematous TM with bulging.  Eyes: Pupils are equal, round, and reactive to light. Conjunctivae are normal.  Neurological: She is alert and oriented to person, place, and time.  Skin: She is not diaphoretic.   UC Treatments / Results  Labs (all labs ordered are listed, but only abnormal results are displayed) Labs Reviewed - No data to display  EKG None  Radiology No results found.  Procedures Procedures (including critical care time)  Medications Ordered in UC Medications - No data to display  Initial Impression / Assessment and Plan / UC Course  I have reviewed the triage vital signs  and the nursing notes.  Pertinent labs & imaging results that were available during my care of the patient were reviewed by me and considered in my medical decision making (see chart for details).    Will treat for otitis media with azithromycin.  Flonase as directed for possible eustachian tube dysfunction.  Return precautions given.  Patient expresses understanding and agrees to plan.  Final Clinical Impressions(s) / UC Diagnoses   Final diagnoses:  Non-recurrent acute suppurative otitis media of right ear without spontaneous rupture of tympanic membrane    ED Prescriptions    Medication Sig Dispense Auth. Provider   azithromycin (ZITHROMAX) 250 MG tablet Take 1 tablet (250 mg total) by mouth daily. Take first 2 tablets together, then 1 every day until finished. 6 tablet Estiben Mizuno V, PA-C   fluticasone (FLONASE) 50 MCG/ACT nasal spray Place 2 sprays into both nostrils daily. 1 g Tobin Chad, [place] 06/04/18 1341

## 2018-06-04 NOTE — ED Triage Notes (Signed)
The patient presented to the [facility] with a complaint of 'something in my right ear" x 3 days.

## 2018-06-30 ENCOUNTER — Ambulatory Visit (INDEPENDENT_AMBULATORY_CARE_PROVIDER_SITE_OTHER): Payer: BLUE CROSS/BLUE SHIELD | Admitting: Physician Assistant

## 2018-08-10 ENCOUNTER — Encounter (HOSPITAL_COMMUNITY): Payer: Self-pay

## 2018-08-10 ENCOUNTER — Ambulatory Visit (HOSPITAL_COMMUNITY)
Admission: EM | Admit: 2018-08-10 | Discharge: 2018-08-10 | Disposition: A | Discharge status: Home / Self Care | Payer: Self-pay | Attending: Family Medicine | Admitting: Family Medicine

## 2018-08-10 DIAGNOSIS — Z885 Allergy status to narcotic agent status: Secondary | ICD-10-CM | POA: Insufficient documentation

## 2018-08-10 DIAGNOSIS — N3090 Cystitis, unspecified without hematuria: Secondary | ICD-10-CM | POA: Insufficient documentation

## 2018-08-10 DIAGNOSIS — B9689 Other specified bacterial agents as the cause of diseases classified elsewhere: Secondary | ICD-10-CM | POA: Insufficient documentation

## 2018-08-10 DIAGNOSIS — Z880 Allergy status to penicillin: Secondary | ICD-10-CM | POA: Insufficient documentation

## 2018-08-10 LAB — POCT URINALYSIS DIP (DEVICE)
Bilirubin Urine: NEGATIVE
GLUCOSE, UA: NEGATIVE mg/dL
Ketones, ur: NEGATIVE mg/dL
Leukocytes, UA: NEGATIVE
Nitrite: NEGATIVE
PROTEIN: NEGATIVE mg/dL
Specific Gravity, Urine: >=1.03 (ref 1.005–1.030)
UROBILINOGEN UA: 0.2 mg/dL (ref 0.0–1.0)
pH: 5.5 (ref 5.0–8.0)

## 2018-08-10 MED ORDER — NITROFURANTOIN MONOHYD MACRO 100 MG PO CAPS: 100.0000 mg | ORAL_CAPSULE | Freq: Two times a day (BID) | ORAL | 0 refills | Status: AC

## 2018-08-10 NOTE — ED Provider Notes (Signed)
[place]    CSN: [id number] Arrival date & time: 08/10/18  1424     History   Chief Complaint Chief Complaint  Patient presents with  . Urinary Frequency    HPI Melissa Harding is a 57 y.o. female.   Melissa Harding presents with complaints of frequent urination which has been experiencing for the past week. States at times she feels she has to go and only a small amount of urine is produced, other times large amount. No back pain or abdominal pain. No fevers. Only occasionally has a tingling sensation with urination, otherwise no burning. No vaginal symptoms. Post menopausal. No loss of bladder control. States in the past has has stress incontinence. Has not taken any medications for symptoms. Without contributing medical history, no known diabetes.    ROS per HPI.      History reviewed. No pertinent past medical history.  Patient Active Problem List   Diagnosis Date Noted  . MENOPAUSAL SYNDROME 03/05/2009  . MUSCLE SPASM 03/05/2009  . DYSPHAGIA UNSPECIFIED 03/05/2009  . TOBACCO ABUSE 03/05/2008  . GERD 10/14/2004    Past Surgical History:  Procedure Laterality Date  . APPENDECTOMY    . CESAREAN SECTION     2 previous  . ECTOPIC PREGNANCY SURGERY     x2  . LAPAROSCOPIC TUBAL LIGATION    . TUBAL LIGATION      OB History    Gravida  6   Para      Term      Preterm      AB  4   Living  2     SAB      TAB  2   Ectopic  2   Multiple      Live Births  2            Home Medications    Prior to Admission medications   Medication Sig Start Date End Date Taking? Authorizing Provider  nitrofurantoin, macrocrystal-monohydrate, (MACROBID) 100 MG capsule Take 1 capsule (100 mg total) by mouth 2 (two) times daily for 5 days. 08/10/18 08/15/18  Zigmund Gottron, NP    Family History Family History  Problem Relation Age of Onset  . Hypertension Mother   . Hypertension Father   . Cancer Paternal Grandmother   . Heart attack Paternal  Grandfather     Social History Social History   Tobacco Use  . Smoking status: Never Smoker  . Smokeless tobacco: Never Used  Substance Use Topics  . Alcohol use: Yes    Comment: rarely a glass of wine  . Drug use: No     Allergies   Ampicillin and Percocet [oxycodone-acetaminophen]   Review of Systems Review of Systems   Physical Exam Triage Vital Signs ED Triage Vitals  Enc Vitals Group     BP 08/10/18 1441 124/81     Pulse Rate 08/10/18 1441 92     Resp 08/10/18 1441 18     Temp 08/10/18 1441 (!) 97.5 F (36.4 C)     Temp src --      SpO2 08/10/18 1441 95 %     Weight --      Height --      Head Circumference --      Peak Flow --      Pain Score 08/10/18 1440 0     Pain Loc --      Pain Edu? --      Excl. in [place]? --  No data found.  Updated Vital Signs BP 124/81   Pulse 92   Temp (!) 97.5 F (36.4 C)   Resp 18   SpO2 95%    Physical Exam  Constitutional: She is oriented to person, place, and time. She appears well-developed and well-nourished. No distress.  Cardiovascular: Normal rate, regular rhythm and normal heart sounds.  Pulmonary/Chest: Effort normal and breath sounds normal.  Abdominal: Soft. Bowel sounds are normal. There is no tenderness. There is no rigidity, no rebound, no guarding and no CVA tenderness.  Neurological: She is alert and oriented to person, place, and time.  Skin: Skin is warm and dry.     UC Treatments / Results  Labs (all labs ordered are listed, but only abnormal results are displayed) Labs Reviewed  POCT URINALYSIS DIP (DEVICE) - Abnormal; Notable for the following components:      Result Value   Hgb urine dipstick TRACE (*)    All other components within normal limits  URINE CULTURE    EKG None  Radiology No results found.  Procedures Procedures (including critical care time)  Medications Ordered in UC Medications - No data to display  Initial Impression / Assessment and Plan / UC Course  I  have reviewed the triage vital signs and the nursing notes.  Pertinent labs & imaging results that were available during my care of the patient were reviewed by me and considered in my medical decision making (see chart for details).     Afebrile. Non toxic. No cva tenderness. No abdominal tenderness. hgb to urine, despite no nitrite or leuks. With urinary frequency as well, opted to provide treatment with macrobid, pending urine culture. Push fluids. Return precautions provided. If symptoms worsen or do not improve in the next week to return to be seen or to follow up with PCP.  Patient verbalized understanding and agreeable to plan.   Final Clinical Impressions(s) / UC Diagnoses   Final diagnoses:  Cystitis     Discharge Instructions     We will start antibiotics at this time as there is some concern for bladder infection.  I have also sent your urine to be cultured.  Will notify you of any positive findings and if any changes to treatment are needed.   Drink plenty of water to empty bladder regularly. Avoid alcohol and caffeine as these may irritate the bladder.   If symptoms worsen or do not improve in the next week to return to be seen or to follow up with your PCP.     ED Prescriptions    Medication Sig Dispense Auth. Provider   nitrofurantoin, macrocrystal-monohydrate, (MACROBID) 100 MG capsule Take 1 capsule (100 mg total) by mouth 2 (two) times daily for 5 days. 10 capsule Zigmund Gottron, NP     Controlled Substance Prescriptions [place] Controlled Substance Registry consulted? Not Applicable   Zigmund Gottron, NP 08/10/18 1507

## 2018-08-10 NOTE — ED Triage Notes (Signed)
Pt presents with complaints of frequent urination x 4 days. Denies any pain or dysuria.

## 2018-08-10 NOTE — Discharge Instructions (Signed)
We will start antibiotics at this time as there is some concern for bladder infection.  I have also sent your urine to be cultured.  Will notify you of any positive findings and if any changes to treatment are needed.   Drink plenty of water to empty bladder regularly. Avoid alcohol and caffeine as these may irritate the bladder.   If symptoms worsen or do not improve in the next week to return to be seen or to follow up with your PCP.

## 2018-08-11 LAB — URINE CULTURE

## 2018-08-24 ENCOUNTER — Inpatient Hospital Stay: Payer: Self-pay

## 2018-09-05 ENCOUNTER — Ambulatory Visit (INDEPENDENT_AMBULATORY_CARE_PROVIDER_SITE_OTHER): Payer: Self-pay | Admitting: Physician Assistant

## 2018-09-05 ENCOUNTER — Other Ambulatory Visit: Payer: Self-pay

## 2018-09-05 ENCOUNTER — Encounter (INDEPENDENT_AMBULATORY_CARE_PROVIDER_SITE_OTHER): Payer: Self-pay | Admitting: Physician Assistant

## 2018-09-05 VITALS — BP 137/79 | HR 73 | Temp 97.9°F | Ht 62.0 in | Wt 201.0 lb

## 2018-09-05 DIAGNOSIS — Z1211 Encounter for screening for malignant neoplasm of colon: Secondary | ICD-10-CM

## 2018-09-05 DIAGNOSIS — R202 Paresthesia of skin: Secondary | ICD-10-CM

## 2018-09-05 DIAGNOSIS — Z1159 Encounter for screening for other viral diseases: Secondary | ICD-10-CM

## 2018-09-05 DIAGNOSIS — R252 Cramp and spasm: Secondary | ICD-10-CM

## 2018-09-05 NOTE — Patient Instructions (Signed)
Leg Cramps Leg cramps occur when a muscle or muscles tighten and you have no control over this tightening (involuntary muscle contraction). Muscle cramps can develop in any muscle, but the most common place is in the calf muscles of the leg. Those cramps can occur during exercise or when you are at rest. Leg cramps are painful, and they may last for a few seconds to a few minutes. Cramps may return several times before they finally stop. Usually, leg cramps are not caused by a serious medical problem. In many cases, the cause is not known. Some common causes include:  Overexertion.  Overuse from repetitive motions, or doing the same thing over and over.  Remaining in a certain position for a long period of time.  Improper preparation, form, or technique while performing a sport or an activity.  Dehydration.  Injury.  Side effects of some medicines.  Abnormally low levels of the salts and ions in your blood (electrolytes), especially potassium and calcium. These levels could be low if you are taking water pills (diuretics) or if you are pregnant.  Follow these instructions at home: Watch your condition for any changes. Taking the following actions may help to lessen any discomfort that you are feeling:  Stay well-hydrated. Drink enough fluid to keep your urine clear or pale yellow.  Try massaging, stretching, and relaxing the affected muscle. Do this for several minutes at a time.  For tight or tense muscles, use a warm towel, heating pad, or hot shower water directed to the affected area.  If you are sore or have pain after a cramp, applying ice to the affected area may relieve discomfort. ? Put ice in a plastic bag. ? Place a towel between your skin and the bag. ? Leave the ice on for 20 minutes, 2-3 times per day.  Avoid strenuous exercise for several days if you have been having frequent leg cramps.  Make sure that your diet includes the essential minerals for your muscles to  work normally.  Take medicines only as directed by your health care provider.  Contact a health care provider if:  Your leg cramps get more severe or more frequent, or they do not improve over time.  Your foot becomes cold, numb, or blue. This information is not intended to replace advice given to you by your health care provider. Make sure you discuss any questions you have with your health care provider. Document Released: 12/09/2004 Document Revised: 04/08/2016 Document Reviewed: 10/09/2014 Elsevier Interactive Patient Education  2018 [employer] Inc[employer]  

## 2018-09-05 NOTE — Progress Notes (Signed)
   Subjective:  Patient ID: Melissa Harding, female    DOB: [birth year]  Age: 57 y.o. MRN: [id number]  CC: legs aching  HPI Melissa Harding is a 58 y.o. female with a medical history of palpitations, vertigo, pityriasis rosea, and scapulohumeral bursitis presents as a new patient with complaint of bilateral ache of the thigh and calf pain. Pt has had cramps in the legs attributed to dehydration. Pt still does not hydrate properly. Pain is usually worse when standing from a seated position. Can feel cramps in the calves when walking. There is also occasional numbness and a burning/hot sensation of the calf. Level of pain is usually less when walking. Nothing relieves the pain e.g pills, rest, elevation. Pain self resolves. Does not take any kind of medications or supplements. Denies limited aROM of the legs or back pain. Denies CP, palpitations, SOB, HA, abdominal pain, f/c/n/v, rash, swelling, or GI/GU sxs.     ROS Review of Systems  Constitutional: Negative for chills, fever and malaise/fatigue.  Eyes: Negative for blurred vision.  Respiratory: Negative for shortness of breath.   Cardiovascular: Negative for chest pain and palpitations.  Gastrointestinal: Negative for abdominal pain and nausea.  Genitourinary: Negative for dysuria and hematuria.  Musculoskeletal: Positive for myalgias. Negative for joint pain.  Skin: Negative for rash.  Neurological: Negative for tingling and headaches.  Psychiatric/Behavioral: Negative for depression. The patient is not nervous/anxious.     Objective:  BP 137/79 (BP Location: Left Arm, Patient Position: Sitting, Cuff Size: Normal)   Pulse 73   Temp 97.9 F (36.6 C) (Oral)   Ht 5\' 2"  (1.575 m)   Wt 201 lb (91.2 kg)   SpO2 93%   BMI 36.76 kg/m   BP/Weight 09/05/2018 08/10/2018 3/81/8299  Systolic BP 371 696 789  Diastolic BP 79 81 76  Wt. (Lbs) 201 - -  BMI 36.76 - -      Physical Exam  Constitutional: She is oriented to person, place, and  time.  Well developed, obese, NAD, polite  HENT:  Head: Normocephalic and atraumatic.  Eyes: No scleral icterus.  Neck: Normal range of motion. Neck supple.  Cardiovascular: Normal rate, regular rhythm and normal heart sounds.  Pulmonary/Chest: Effort normal and breath sounds normal.  Musculoskeletal: She exhibits no edema.  Full aROM of hip and knees. No increased warmth of the calves or thighs. No deformities of the legs.   Neurological: She is alert and oriented to person, place, and time.  Strength 5/5 of the LE bilaterally. Normal gait.  Skin: Skin is warm and dry. No rash noted. No erythema. No pallor.  Psychiatric: She has a normal mood and affect. Her behavior is normal. Thought content normal.  Vitals reviewed.    Assessment & Plan:    1. Leg cramps - CBC with Differential - Comprehensive metabolic panel - CK - TSH  2. Paresthesia of both legs - Vitamin B12  3. Special screening for malignant neoplasms, colon - Fecal occult blood, imunochemical  4. Need for hepatitis C screening test - Hepatitis c antibody (reflex)   Follow-up: 4 weeks for leg pain  Clent Demark PA

## 2018-09-06 LAB — COMPREHENSIVE METABOLIC PANEL
ALBUMIN: 4.5 g/dL (ref 3.5–5.5)
ALT: 12 IU/L (ref 0–32)
AST: 18 IU/L (ref 0–40)
Albumin/Globulin Ratio: 1.9 (ref 1.2–2.2)
Alkaline Phosphatase: 57 IU/L (ref 39–117)
BUN / CREAT RATIO: 17 (ref 9–23)
BUN: 13 mg/dL (ref 6–24)
CO2: 24 mmol/L (ref 20–29)
CREATININE: 0.75 mg/dL (ref 0.57–1.00)
Calcium: 9.9 mg/dL (ref 8.7–10.2)
Chloride: 103 mmol/L (ref 96–106)
GFR, EST AFRICAN AMERICAN: 104 mL/min/{1.73_m2} (ref >59)
GFR, EST NON AFRICAN AMERICAN: 90 mL/min/{1.73_m2} (ref >59)
GLUCOSE: 81 mg/dL (ref 65–99)
Globulin, Total: 2.4 g/dL (ref 1.5–4.5)
Potassium: 4.9 mmol/L (ref 3.5–5.2)
Sodium: 141 mmol/L (ref 134–144)
TOTAL PROTEIN: 6.9 g/dL (ref 6.0–8.5)

## 2018-09-06 LAB — CBC WITH DIFFERENTIAL/PLATELET
BASOS ABS: 0.1 10*3/uL (ref 0.0–0.2)
Basos: 1 %
EOS (ABSOLUTE): 0.3 10*3/uL (ref 0.0–0.4)
EOS: 2 %
HEMOGLOBIN: 13.6 g/dL (ref 11.1–15.9)
Hematocrit: 42.2 % (ref 34.0–46.6)
IMMATURE GRANS (ABS): 0 10*3/uL (ref 0.0–0.1)
Immature Granulocytes: 0 %
Lymphocytes Absolute: 7.7 10*3/uL — ABNORMAL HIGH (ref 0.7–3.1)
Lymphs: 60 %
MCH: 29.1 pg (ref 26.6–33.0)
MCHC: 32.2 g/dL (ref 31.5–35.7)
MCV: 90 fL (ref 79–97)
Monocytes Absolute: 0.4 10*3/uL (ref 0.1–0.9)
Monocytes: 3 %
NEUTROS PCT: 34 %
Neutrophils Absolute: 4.5 10*3/uL (ref 1.4–7.0)
Platelets: 463 10*3/uL — ABNORMAL HIGH (ref 150–450)
RBC: 4.67 x10E6/uL (ref 3.77–5.28)
RDW: 13.2 % (ref 12.3–15.4)
WBC: 13 10*3/uL — AB (ref 3.4–10.8)

## 2018-09-06 LAB — HCV COMMENT:

## 2018-09-06 LAB — VITAMIN B12: VITAMIN B 12: 286 pg/mL (ref 232–1245)

## 2018-09-06 LAB — TSH: TSH: 2.39 u[IU]/mL (ref 0.450–4.500)

## 2018-09-06 LAB — HEPATITIS C ANTIBODY (REFLEX): HCV Ab: <0.1 s/co ratio (ref 0.0–0.9)

## 2018-09-06 LAB — CK: Total CK: 128 U/L (ref 24–173)

## 2018-09-07 ENCOUNTER — Telehealth (INDEPENDENT_AMBULATORY_CARE_PROVIDER_SITE_OTHER): Payer: Self-pay

## 2018-09-07 NOTE — Telephone Encounter (Signed)
Noted  

## 2018-09-07 NOTE — Telephone Encounter (Signed)
Patient is aware of results. Asked patient how she was feeling and if she was experiencing any symptoms. Patient states she feels fine; no symptoms. Nat Christen, CMA

## 2018-09-07 NOTE — Telephone Encounter (Signed)
-----   Message from Clent Demark, PA-C sent at 09/06/2018  1:40 PM EDT ----- All results normal except for mild elevation of white blood cells which indicate a possible viral illness. Maybe she is feeling cold/flu symptoms? Please ask how she is feeling.

## 2018-10-03 ENCOUNTER — Ambulatory Visit (INDEPENDENT_AMBULATORY_CARE_PROVIDER_SITE_OTHER): Payer: Self-pay | Admitting: Physician Assistant

## 2018-10-03 ENCOUNTER — Encounter (HOSPITAL_COMMUNITY): Payer: Self-pay

## 2018-10-03 ENCOUNTER — Ambulatory Visit (HOSPITAL_COMMUNITY)
Admission: EM | Admit: 2018-10-03 | Discharge: 2018-10-03 | Disposition: A | Discharge status: Home / Self Care | Payer: Self-pay | Attending: Family Medicine | Admitting: Family Medicine

## 2018-10-03 ENCOUNTER — Other Ambulatory Visit: Payer: Self-pay

## 2018-10-03 DIAGNOSIS — H6121 Impacted cerumen, right ear: Secondary | ICD-10-CM

## 2018-10-03 NOTE — ED Triage Notes (Signed)
Pt cc right ear is impacted this started yesterday.

## 2018-10-03 NOTE — ED Provider Notes (Signed)
[facility]   [id number] 10/03/18 Arrival Time: 33  CC:EAR PAIN  SUBJECTIVE: History from: patient.  Melissa Harding is a 57 y.o. female who presents with of right ear cerumen impaction that began today. Was trying to remove ear wax and states ear became impacted.  Denies ear pain.  Patient has tried flushing ear out at home without relief.  Denies aggravating factors.  Reports similar symptoms in the past that improved with ear lavage.  Complains of associated decreased hearing.    Denies fever, chills, fatigue, sinus pain, rhinorrhea, ear discharge, sore throat, SOB, wheezing, chest pain, nausea, changes in bowel or bladder habits.    ROS: As per HPI.  History reviewed. No pertinent past medical history. Past Surgical History:  Procedure Laterality Date  . APPENDECTOMY    . CESAREAN SECTION     2 previous  . ECTOPIC PREGNANCY SURGERY     x2  . LAPAROSCOPIC TUBAL LIGATION    . TUBAL LIGATION     Allergies  Allergen Reactions  . Ampicillin     REACTION: seizures  . Percocet [Oxycodone-Acetaminophen] Nausea Only   No current facility-administered medications on file prior to encounter.    No current outpatient medications on file prior to encounter.   Social History   Socioeconomic History  . Marital status: Married    Spouse name: steven  . Number of children: 2  . Years of education: 27  . Highest education level: Not on file  Occupational History  . Occupation: part time    Comment: self employed  Social Needs  . Financial resource strain: Not on file  . Food insecurity:    Worry: Not on file    Inability: Not on file  . Transportation needs:    Medical: Not on file    Non-medical: Not on file  Tobacco Use  . Smoking status: Never Smoker  . Smokeless tobacco: Never Used  Substance and Sexual Activity  . Alcohol use: Yes    Comment: rarely a glass of wine  . Drug use: No  . Sexual activity: Yes    Birth control/protection: Surgical    Lifestyle  . Physical activity:    Days per week: 0 days    Minutes per session: 0 min  . Stress: Not at all  Relationships  . Social connections:    Talks on phone: More than three times a week    Gets together: Once a week    Attends religious service: More than 4 times per year    Active member of club or organization: Yes    Attends meetings of clubs or organizations: More than 4 times per year    Relationship status: Married  . Intimate partner violence:    Fear of current or ex partner: No    Emotionally abused: No    Physically abused: No    Forced sexual activity: No  Other Topics Concern  . Not on file  Social History Narrative  . Not on file   Family History  Problem Relation Age of Onset  . Hypertension Mother   . Hypertension Father   . Cancer Paternal Grandmother   . Heart attack Paternal Grandfather     OBJECTIVE:  Vitals:   10/03/18 1657 10/03/18 1658  BP:  134/77  Pulse:  72  Resp:  18  Temp:  97.8 F (36.6 C)  SpO2:  100%  Weight: 195 lb (88.5 kg)      General appearance: alert; well-appearing HEENT:  Ears: LT EACs clear, TM pearly gray with visible cone of light, without erythema, RT EAC with cerumen impaction; Eyes: PERRL, EOMI grossly; Nose: no clear rhinorrhea; Throat: oropharynx clear, tonsils not enlarged or erythematous, no white tonsillar exudates, uvula midline Neck: supple without LAD Lungs: unlabored respirations, symmetrical air entry; cough: absent; no respiratory distress Heart: regular rate and rhythm.  Radial pulses 2+ symmetrical bilaterally Skin: warm and dry Psychological: alert and cooperative; normal mood and affect   PROCEDURE:  Consent granted.  Right ear lavage performed by EMT Hassan Rowan.  TM visualized.  PT tolerated procedure well.    ASSESSMENT & PLAN:  1. Impacted cerumen of right ear     Ear lavage performed.   Continue to use OTC ibuprofen and/ or tylenol as needed for pain control Return or follow up with  PCP if symptoms persists Return here or go to the ER if you have any new or worsening symptoms fever, chills, nasal congestion, runny nose, sore throat, cough, etc...  Reviewed expectations re: course of current medical issues. Questions answered. Outlined signs and symptoms indicating need for more acute intervention. Patient verbalized understanding. After Visit Summary given.         Lestine Box, PA-C 10/03/18 [phone]

## 2018-10-03 NOTE — Discharge Instructions (Addendum)
Ear lavage performed.   Continue to use OTC ibuprofen and/ or tylenol as needed for pain control Return or follow up with PCP if symptoms persists Return here or go to the ER if you have any new or worsening symptoms fever, chills, nasal congestion, runny nose, sore throat, cough, etc..Marland Kitchen

## 2018-10-04 ENCOUNTER — Ambulatory Visit (INDEPENDENT_AMBULATORY_CARE_PROVIDER_SITE_OTHER): Payer: Self-pay | Admitting: Physician Assistant

## 2018-10-16 ENCOUNTER — Ambulatory Visit (INDEPENDENT_AMBULATORY_CARE_PROVIDER_SITE_OTHER): Payer: Self-pay | Admitting: Physician Assistant

## 2018-11-16 ENCOUNTER — Other Ambulatory Visit: Payer: Self-pay | Admitting: Obstetrics and Gynecology

## 2018-11-16 DIAGNOSIS — Z1231 Encounter for screening mammogram for malignant neoplasm of breast: Secondary | ICD-10-CM

## 2018-12-06 ENCOUNTER — Other Ambulatory Visit (HOSPITAL_COMMUNITY)
Admission: RE | Admit: 2018-12-06 | Discharge: 2018-12-06 | Disposition: A | Discharge status: Home / Self Care | Payer: BLUE CROSS/BLUE SHIELD | Source: Ambulatory Visit | Attending: Nurse Practitioner | Admitting: Nurse Practitioner

## 2018-12-06 ENCOUNTER — Encounter (INDEPENDENT_AMBULATORY_CARE_PROVIDER_SITE_OTHER): Payer: Self-pay | Admitting: Nurse Practitioner

## 2018-12-06 ENCOUNTER — Ambulatory Visit (INDEPENDENT_AMBULATORY_CARE_PROVIDER_SITE_OTHER): Payer: BLUE CROSS/BLUE SHIELD | Admitting: Nurse Practitioner

## 2018-12-06 ENCOUNTER — Other Ambulatory Visit: Payer: Self-pay

## 2018-12-06 VITALS — BP 113/73 | HR 59 | Temp 97.8°F | Resp 16 | Ht 62.0 in | Wt 199.0 lb

## 2018-12-06 DIAGNOSIS — N760 Acute vaginitis: Secondary | ICD-10-CM

## 2018-12-06 DIAGNOSIS — Z6835 Body mass index (BMI) 35.0-35.9, adult: Secondary | ICD-10-CM

## 2018-12-06 DIAGNOSIS — M2550 Pain in unspecified joint: Secondary | ICD-10-CM

## 2018-12-06 DIAGNOSIS — E669 Obesity, unspecified: Secondary | ICD-10-CM

## 2018-12-06 MED ORDER — DICLOFENAC SODIUM 1 % TD GEL
4.0000 g | Freq: Four times a day (QID) | TRANSDERMAL | 1 refills | Status: AC
Start: 2018-12-06 — End: 2019-01-05

## 2018-12-06 MED ORDER — PHENTERMINE HCL 37.5 MG PO TABS: 37.5000 mg | ORAL_TABLET | Freq: Every day | ORAL | 1 refills | Status: DC

## 2018-12-06 MED ORDER — TIZANIDINE HCL 2 MG PO TABS: 2.0000 mg | ORAL_TABLET | Freq: Every day | ORAL | 0 refills | Status: DC

## 2018-12-06 NOTE — Progress Notes (Signed)
Assessment & Plan:  Melissa Harding was seen today for leg pain.  Diagnoses and all orders for this visit:  Obesity (BMI 35.0-39.9 without comorbidity) -     Amb ref to Medical Nutrition Therapy-MNT      phentermine (ADIPEX-P) 37.5 MG tablet; Take 1 tablet (37.5 mg total) by mouth daily before breakfast for 30 days. Discussed diet and exercise for person with BMI >36. Instructed: You must burn more calories than you eat. Losing 5 percent of your body weight should be considered a success. In the longer term, losing more than 15 percent of your body weight and staying at this weight is an extremely good result. However, keep in mind that even losing 5 percent of your body weight leads to important health benefits, so try not to get discouraged if you're not able to lose more than this. Will recheck weight in 2 months.   Polyarthralgia -     CBC -     VITAMIN D 25 Hydroxy (Vit-D Deficiency, Fractures) -     diclofenac sodium (VOLTAREN) 1 % GEL; Apply 4 g topically 4 (four) times daily for 30 days. -     tiZANidine (ZANAFLEX) 2 MG tablet; Take 1 tablet (2 mg total) by mouth at bedtime.  Work on losing weight to help reduce joint pain. May alternate with heat and ice application for pain relief. May also alternate with acetaminophen and Ibuprofen as prescribed pain relief. Other alternatives include massage, acupuncture and water aerobics.  You must stay active and avoid a sedentary lifestyle.  Acute vaginitis -     Urine cytology ancillary only    Patient has been counseled on age-appropriate routine health concerns for screening and prevention. These are reviewed and up-to-date. Referrals have been placed accordingly. Immunizations are up-to-date or declined.    Subjective:   Chief Complaint  Patient presents with  . Leg Pain   HPI Melissa Harding 58 y.o. female presents to office today with complaints of BLE pain.    Joint/Muscle Pain: Patient complains of arthralgias for which has been  present for several months. Pain is located in multiple joints and including the bilateral thighs and lower right leg, is described as aching, boring, dull and throbbing, and is intermittent, and worse at night during bedtime .  Associated symptoms include: none.  The patient has tried OTC Aspercreme and Ibuprofen with some relief of symptoms.  Related to injury:   no.She endorses pain as an aching sensation.   Obesity She is obese with polyarthralgia. Recently started working out at [employer]. She would like to be referred to a nutritionist to assist with weight loss. She has cut out snacking and is "watching what I eat". Requesting phentermine today. She does not have a history of HTN.   Vaginitis: Patient complains of increased vaginal discharge for several days. Vaginal symptoms include discharge described as white and odorless.Vulvar symptoms include none.STI Risk: Very low risk of STD exposure. .Other associated symptoms: none.Menstrual pattern:  Contraception: none She is married.    Review of Systems  Constitutional: Negative for fever, malaise/fatigue and weight loss.  HENT: Negative.  Negative for nosebleeds.   Eyes: Negative.  Negative for blurred vision, double vision and photophobia.  Respiratory: Negative.  Negative for cough and shortness of breath.   Cardiovascular: Negative.  Negative for chest pain, palpitations and leg swelling.  Gastrointestinal: Negative.  Negative for heartburn, nausea and vomiting.  Genitourinary:       SEE HPI  Musculoskeletal:  Positive for joint pain and myalgias.       SEE HPI  Neurological: Negative.  Negative for dizziness, focal weakness, seizures and headaches.  Psychiatric/Behavioral: Negative.  Negative for suicidal ideas.    No past medical history on file.  Past Surgical History:  Procedure Laterality Date  . APPENDECTOMY    . CESAREAN SECTION     2 previous  . ECTOPIC PREGNANCY SURGERY     x2  . LAPAROSCOPIC TUBAL LIGATION     . TUBAL LIGATION      Family History  Problem Relation Age of Onset  . Hypertension Mother   . Hypertension Father   . Cancer Paternal Grandmother   . Heart attack Paternal Grandfather     Social History Reviewed with no changes to be made today.   No outpatient medications prior to visit.   No facility-administered medications prior to visit.     Allergies  Allergen Reactions  . Ampicillin     REACTION: seizures  . Percocet [Oxycodone-Acetaminophen] Nausea Only       Objective:    BP 113/73   Pulse (!) 59   Temp 97.8 F (36.6 C) (Oral)   Resp 16   Ht 5\' 2"  (1.575 m)   Wt 199 lb (90.3 kg)   SpO2 100%   BMI 36.40 kg/m  Wt Readings from Last 3 Encounters:  12/06/18 199 lb (90.3 kg)  10/03/18 195 lb (88.5 kg)  09/05/18 201 lb (91.2 kg)    Physical Exam Vitals signs and nursing note reviewed.  Constitutional:      Appearance: She is well-developed.  HENT:     Head: Normocephalic and atraumatic.  Neck:     Musculoskeletal: Normal range of motion.  Cardiovascular:     Rate and Rhythm: Normal rate and regular rhythm.     Heart sounds: Normal heart sounds. No murmur. No friction rub. No gallop.   Pulmonary:     Effort: Pulmonary effort is normal. No tachypnea or respiratory distress.     Breath sounds: Normal breath sounds. No decreased breath sounds, wheezing, rhonchi or rales.  Chest:     Chest wall: No tenderness.  Abdominal:     General: Bowel sounds are normal.     Palpations: Abdomen is soft.  Musculoskeletal: Normal range of motion.     Right hip: Normal. She exhibits normal range of motion.     Left hip: Normal. She exhibits normal range of motion.     Right upper leg: She exhibits no tenderness, no swelling and no edema.     Left upper leg: Normal. She exhibits no tenderness, no swelling and no edema.     Right lower leg: She exhibits no tenderness and no swelling. No edema.     Left lower leg: She exhibits no tenderness and no swelling. No  edema.  Skin:    General: Skin is warm and dry.  Neurological:     Mental Status: She is alert and oriented to person, place, and time.     Coordination: Coordination normal.  Psychiatric:        Behavior: Behavior normal. Behavior is cooperative.        Thought Content: Thought content normal.        Judgment: Judgment normal.         Patient has been counseled extensively about nutrition and exercise as well as the importance of adherence with medications and regular follow-up. The patient was given clear instructions to go to ER or  return to medical center if symptoms don't improve, worsen or new problems develop. The patient verbalized understanding.   Follow-up: Return in about 2 months (around 02/04/2019) for Weight loss; B/L thigh pain.   Gildardo Pounds, FNP-BC [facility] and [place], [place]   12/06/2018, 2:56 PM

## 2018-12-06 NOTE — Progress Notes (Signed)
BLE pain. Right calf pain  5/10 constant pain.  Rubs asper cream and advil  Flu shot PAP next OV

## 2018-12-07 LAB — CBC
HEMATOCRIT: 41.4 % (ref 34.0–46.6)
Hemoglobin: 14 g/dL (ref 11.1–15.9)
MCH: 30.2 pg (ref 26.6–33.0)
MCHC: 33.8 g/dL (ref 31.5–35.7)
MCV: 89 fL (ref 79–97)
Platelets: 458 10*3/uL — ABNORMAL HIGH (ref 150–450)
RBC: 4.64 x10E6/uL (ref 3.77–5.28)
RDW: 13.1 % (ref 11.7–15.4)
WBC: 13.3 10*3/uL — AB (ref 3.4–10.8)

## 2018-12-07 LAB — VITAMIN D 25 HYDROXY (VIT D DEFICIENCY, FRACTURES): Vit D, 25-Hydroxy: 39.7 ng/mL (ref 30.0–100.0)

## 2018-12-11 ENCOUNTER — Other Ambulatory Visit: Payer: Self-pay | Admitting: Nurse Practitioner

## 2018-12-11 DIAGNOSIS — D72829 Elevated white blood cell count, unspecified: Secondary | ICD-10-CM

## 2018-12-11 LAB — URINE CYTOLOGY ANCILLARY ONLY
CHLAMYDIA, DNA PROBE: NEGATIVE
Neisseria Gonorrhea: NEGATIVE
Trichomonas: NEGATIVE

## 2018-12-12 ENCOUNTER — Other Ambulatory Visit: Payer: Self-pay | Admitting: Nurse Practitioner

## 2018-12-12 LAB — URINE CYTOLOGY ANCILLARY ONLY: CANDIDA VAGINITIS: NEGATIVE

## 2018-12-12 MED ORDER — METRONIDAZOLE 500 MG PO TABS: 500.0000 mg | ORAL_TABLET | Freq: Two times a day (BID) | ORAL | 0 refills | Status: AC

## 2018-12-13 ENCOUNTER — Ambulatory Visit
Admission: RE | Admit: 2018-12-13 | Discharge: 2018-12-13 | Disposition: A | Discharge status: Home / Self Care | Payer: BLUE CROSS/BLUE SHIELD | Source: Ambulatory Visit | Attending: Obstetrics and Gynecology | Admitting: Obstetrics and Gynecology

## 2018-12-13 DIAGNOSIS — Z1231 Encounter for screening mammogram for malignant neoplasm of breast: Secondary | ICD-10-CM

## 2018-12-15 ENCOUNTER — Telehealth (INDEPENDENT_AMBULATORY_CARE_PROVIDER_SITE_OTHER): Payer: Self-pay

## 2018-12-15 NOTE — Telephone Encounter (Signed)
Left voicemail notifying patient that WBC are still elevated and she is being referred to a blood specialist for further work up. Vitamin D is normal. Urine cytology is negative. Urine was positive for BV, prescription has been sent to pharmacy. Return call to [facility] at [phone] with any questions or concerns. Nat Christen, CMA

## 2018-12-15 NOTE — Telephone Encounter (Signed)
-----   Message from Gildardo Pounds, NP sent at 12/11/2018  9:49 PM EST ----- WBC is still elevated. I will be referring you to a blood specialist for further work up. Your vitamin D is normal. Urine cytology is negative. Still waiting for the additional results for bacterial vaginosis and yeast.

## 2018-12-18 ENCOUNTER — Telehealth: Payer: Self-pay | Admitting: Family

## 2018-12-18 NOTE — Telephone Encounter (Signed)
lmom to inform patient of new patient appt. Mailed appointment letter for 01/09/19 at 130 pm

## 2018-12-20 ENCOUNTER — Other Ambulatory Visit: Payer: Self-pay

## 2018-12-20 ENCOUNTER — Encounter (HOSPITAL_COMMUNITY): Payer: Self-pay | Admitting: Emergency Medicine

## 2018-12-20 ENCOUNTER — Emergency Department (HOSPITAL_COMMUNITY)
Admission: EM | Admit: 2018-12-20 | Discharge: 2018-12-20 | Disposition: A | Discharge status: Home / Self Care | Payer: BLUE CROSS/BLUE SHIELD | Attending: Emergency Medicine | Admitting: Emergency Medicine

## 2018-12-20 ENCOUNTER — Emergency Department (HOSPITAL_BASED_OUTPATIENT_CLINIC_OR_DEPARTMENT_OTHER): Payer: BLUE CROSS/BLUE SHIELD

## 2018-12-20 DIAGNOSIS — M7989 Other specified soft tissue disorders: Secondary | ICD-10-CM | POA: Diagnosis not present

## 2018-12-20 DIAGNOSIS — R609 Edema, unspecified: Secondary | ICD-10-CM

## 2018-12-20 DIAGNOSIS — M79604 Pain in right leg: Secondary | ICD-10-CM | POA: Diagnosis not present

## 2018-12-20 NOTE — Progress Notes (Signed)
Right lower extremity venous duplex exam completed. Please see preliminary notes on CV PROC under chart review. [name](RDMS RVT) 12/20/18 4:21 PM

## 2018-12-20 NOTE — ED Provider Notes (Signed)
[facility] EMERGENCY DEPARTMENT Provider Note   CSN: [id number] Arrival date & time: 12/20/18  1439     History   Chief Complaint Chief Complaint  Patient presents with  . Leg Pain    HPI Melissa Harding is a 58 y.o. female.  HPI Patient presented to the emergency room for evaluation of leg pain and cramping.  Patient states she has had leg pain and cramping for the last several months.  Patient recently has seen her doctor for evaluation of an elevated white blood cell count.  She is getting referred to an oncologist because of the persistent white count elevation.  Patient mention the cramping symptoms to her doctor and with her referral to an oncologist they recommended she get an ultrasound to make sure she did not have a DVT.  She was sent to the ED.  Patient denies any chest pain or shortness of breath.  No recent travel. History reviewed. No pertinent past medical history.  Patient Active Problem List   Diagnosis Date Noted  . MENOPAUSAL SYNDROME 03/05/2009  . MUSCLE SPASM 03/05/2009  . DYSPHAGIA UNSPECIFIED 03/05/2009  . TOBACCO ABUSE 03/05/2008  . GERD 10/14/2004    Past Surgical History:  Procedure Laterality Date  . APPENDECTOMY    . CESAREAN SECTION     2 previous  . ECTOPIC PREGNANCY SURGERY     x2  . LAPAROSCOPIC TUBAL LIGATION    . TUBAL LIGATION       OB History    Gravida  6   Para      Term      Preterm      AB  4   Living  2     SAB      TAB  2   Ectopic  2   Multiple      Live Births  2            Home Medications    Prior to Admission medications   Medication Sig Start Date End Date Taking? Authorizing Provider  diclofenac sodium (VOLTAREN) 1 % GEL Apply 4 g topically 4 (four) times daily for 30 days. 12/06/18 01/05/19  Gildardo Pounds, NP  phentermine (ADIPEX-P) 37.5 MG tablet Take 1 tablet (37.5 mg total) by mouth daily before breakfast for 30 days. 12/06/18 01/05/19  Gildardo Pounds, NP  tiZANidine  (ZANAFLEX) 2 MG tablet Take 1 tablet (2 mg total) by mouth at bedtime. 12/06/18 03/06/19  Gildardo Pounds, NP    Family History Family History  Problem Relation Age of Onset  . Hypertension Mother   . Hypertension Father   . Cancer Paternal Grandmother   . Heart attack Paternal Grandfather     Social History Social History   Tobacco Use  . Smoking status: Never Smoker  . Smokeless tobacco: Never Used  Substance Use Topics  . Alcohol use: Yes    Comment: rarely a glass of wine  . Drug use: No     Allergies   Ampicillin and Percocet [oxycodone-acetaminophen]   Review of Systems Review of Systems  All other systems reviewed and are negative.    Physical Exam Updated Vital Signs BP 128/85 (BP Location: Right Arm)   Pulse 87   Temp 98 F (36.7 C) (Oral)   Resp 16   Ht 1.575 m (5\' 2" )   Wt 87.5 kg   SpO2 100%   BMI 35.30 kg/m   Physical Exam Vitals signs and nursing note reviewed.  Constitutional:      General: She is not in acute distress.    Appearance: She is well-developed.  HENT:     Head: Normocephalic and atraumatic.     Right Ear: External ear normal.     Left Ear: External ear normal.  Eyes:     General: No scleral icterus.       Right eye: No discharge.        Left eye: No discharge.     Conjunctiva/sclera: Conjunctivae normal.  Neck:     Musculoskeletal: Neck supple.     Trachea: No tracheal deviation.  Cardiovascular:     Rate and Rhythm: Normal rate and regular rhythm.  Pulmonary:     Effort: Pulmonary effort is normal. No respiratory distress.     Breath sounds: Normal breath sounds. No stridor. No wheezing or rales.  Abdominal:     General: Bowel sounds are normal. There is no distension.     Palpations: Abdomen is soft.     Tenderness: There is no abdominal tenderness. There is no guarding or rebound.  Musculoskeletal:        General: No tenderness.  Skin:    General: Skin is warm and dry.     Findings: No rash.  Neurological:      Mental Status: She is alert.     Cranial Nerves: No cranial nerve deficit (no facial droop, extraocular movements intact, no slurred speech).     Sensory: No sensory deficit.     Motor: No abnormal muscle tone or seizure activity.     Coordination: Coordination normal.      ED Treatments / Results  Labs (all labs ordered are listed, but only abnormal results are displayed) Labs Reviewed - No data to display  EKG None  Radiology Vas [place] Lower Extremity Venous (dvt) (only [facility])  Result Date: 12/20/2018  Lower Venous Study Indications: Swelling, and Edema.  Limitations: Body habitus. Comparison Study: Lower extremity venous duplex exam on 04/05/2013. Performing Technologist: Rudell Cobb  Examination Guidelines: A complete evaluation includes B-mode imaging, spectral Doppler, color Doppler, and power Doppler as needed of all accessible portions of each vessel. Bilateral testing is considered an integral part of a complete examination. Limited examinations for reoccurring indications may be performed as noted.  Right Venous Findings: +---------+---------------+---------+-----------+----------+---------+          CompressibilityPhasicitySpontaneityPropertiesSummary   +---------+---------------+---------+-----------+----------+---------+ CFV      Full           Yes      Yes                            +---------+---------------+---------+-----------+----------+---------+ SFJ      Full                                                   +---------+---------------+---------+-----------+----------+---------+ FV Prox  Full                                                   +---------+---------------+---------+-----------+----------+---------+ FV Mid   Full                                                   +---------+---------------+---------+-----------+----------+---------+  FV DistalFull                                                    +---------+---------------+---------+-----------+----------+---------+ PFV      Full                                                   +---------+---------------+---------+-----------+----------+---------+ POP      Full           Yes      Yes                  poor flow +---------+---------------+---------+-----------+----------+---------+ PTV      Full                                                   +---------+---------------+---------+-----------+----------+---------+ PERO     Full                                                   +---------+---------------+---------+-----------+----------+---------+  Left Venous Findings: +---+---------------+---------+-----------+----------+-------+    CompressibilityPhasicitySpontaneityPropertiesSummary +---+---------------+---------+-----------+----------+-------+ CFVFull           Yes      Yes                          +---+---------------+---------+-----------+----------+-------+    Summary: Right: There is no evidence of deep vein thrombosis in the lower extremity. No cystic structure found in the popliteal fossa. Left: No evidence of common femoral vein obstruction.  *See table(s) above for measurements and observations.    Preliminary     Procedures Procedures (including critical care time)  Medications Ordered in ED Medications - No data to display   Initial Impression / Assessment and Plan / ED Course  I have reviewed the triage vital signs and the nursing notes.  Pertinent labs & imaging results that were available during my care of the patient were reviewed by me and considered in my medical decision making (see chart for details).   Patient was sent to the emergency room to rule out a DVT.  She had no significant erythema or tenderness on exam to suggest infection.  DVT study is negative.    Patient appears stable for outpatient follow-up.  Final Clinical Impressions(s) / ED Diagnoses   Final diagnoses:    Pain of right lower extremity    ED Discharge Orders    None       Dorie Rank, MD 12/20/18 [phone]

## 2018-12-20 NOTE — ED Notes (Signed)
Patient report right lower leg cramp for close to a year that has been increasing. Denies shortness of breath or chest pain. Denies recent travel. Reports sitting at work all day. Vital signs within normal limits. EDP at bedside

## 2018-12-20 NOTE — Discharge Instructions (Addendum)
Follow-up with your doctor as planned, take over-the-counter medications as needed

## 2019-01-08 ENCOUNTER — Other Ambulatory Visit: Payer: Self-pay | Admitting: Family

## 2019-01-08 DIAGNOSIS — D72829 Elevated white blood cell count, unspecified: Secondary | ICD-10-CM

## 2019-01-09 ENCOUNTER — Ambulatory Visit: Payer: BLUE CROSS/BLUE SHIELD | Admitting: Family

## 2019-01-09 ENCOUNTER — Inpatient Hospital Stay: Payer: BLUE CROSS/BLUE SHIELD

## 2019-01-23 ENCOUNTER — Encounter (HOSPITAL_COMMUNITY): Payer: Self-pay | Admitting: Emergency Medicine

## 2019-01-23 ENCOUNTER — Emergency Department (HOSPITAL_COMMUNITY)
Admission: EM | Admit: 2019-01-23 | Discharge: 2019-01-23 | Disposition: A | Discharge status: Home / Self Care | Payer: BLUE CROSS/BLUE SHIELD | Attending: Emergency Medicine | Admitting: Emergency Medicine

## 2019-01-23 ENCOUNTER — Other Ambulatory Visit: Payer: Self-pay

## 2019-01-23 DIAGNOSIS — R1013 Epigastric pain: Secondary | ICD-10-CM | POA: Diagnosis not present

## 2019-01-23 DIAGNOSIS — Z79899 Other long term (current) drug therapy: Secondary | ICD-10-CM | POA: Insufficient documentation

## 2019-01-23 LAB — COMPREHENSIVE METABOLIC PANEL
ALT: 12 U/L (ref 0–44)
AST: 17 U/L (ref 15–41)
Albumin: 3.9 g/dL (ref 3.5–5.0)
Alkaline Phosphatase: 52 U/L (ref 38–126)
Anion gap: 6 (ref 5–15)
BUN: 14 mg/dL (ref 6–20)
CO2: 25 mmol/L (ref 22–32)
Calcium: 9.5 mg/dL (ref 8.9–10.3)
Chloride: 109 mmol/L (ref 98–111)
Creatinine, Ser: 0.79 mg/dL (ref 0.44–1.00)
GFR calc Af Amer: >60 mL/min (ref >60)
GFR calc non Af Amer: >60 mL/min (ref >60)
Glucose, Bld: 88 mg/dL (ref 70–99)
Potassium: 3.7 mmol/L (ref 3.5–5.1)
Sodium: 140 mmol/L (ref 135–145)
TOTAL PROTEIN: 6.6 g/dL (ref 6.5–8.1)
Total Bilirubin: 0.1 mg/dL — ABNORMAL LOW (ref 0.3–1.2)

## 2019-01-23 LAB — URINALYSIS, ROUTINE W REFLEX MICROSCOPIC
Bilirubin Urine: NEGATIVE
Glucose, UA: NEGATIVE mg/dL
Hgb urine dipstick: NEGATIVE
Ketones, ur: NEGATIVE mg/dL
Nitrite: NEGATIVE
Protein, ur: NEGATIVE mg/dL
SPECIFIC GRAVITY, URINE: 1.025 (ref 1.005–1.030)
pH: 5 (ref 5.0–8.0)

## 2019-01-23 LAB — CBC
HCT: 41.7 % (ref 36.0–46.0)
Hemoglobin: 13.4 g/dL (ref 12.0–15.0)
MCH: 29.8 pg (ref 26.0–34.0)
MCHC: 32.1 g/dL (ref 30.0–36.0)
MCV: 92.9 fL (ref 80.0–100.0)
Platelets: 407 10*3/uL — ABNORMAL HIGH (ref 150–400)
RBC: 4.49 MIL/uL (ref 3.87–5.11)
RDW: 13.3 % (ref 11.5–15.5)
WBC: 9.5 10*3/uL (ref 4.0–10.5)
nRBC: 0 % (ref 0.0–0.2)

## 2019-01-23 LAB — I-STAT BETA HCG BLOOD, ED (MC, WL, AP ONLY): I-stat hCG, quantitative: <5 m[IU]/mL (ref <5)

## 2019-01-23 LAB — LIPASE, BLOOD: Lipase: 21 U/L (ref 11–51)

## 2019-01-23 MED ORDER — SODIUM CHLORIDE 0.9% FLUSH: 3.0000 mL | Freq: Once | INTRAVENOUS | Status: DC

## 2019-01-23 MED ORDER — ALUM & MAG HYDROXIDE-SIMETH 200-200-20 MG/5ML PO SUSP
30.0000 mL | Freq: Once | ORAL | Status: AC
  Administered 2019-01-23: 30 mL via ORAL
  Filled 2019-01-23: qty 30

## 2019-01-23 MED ORDER — OMEPRAZOLE 20 MG PO CPDR: 20.0000 mg | DELAYED_RELEASE_CAPSULE | Freq: Every day | ORAL | 0 refills | Status: DC

## 2019-01-23 NOTE — ED Triage Notes (Signed)
Pt reports upper abd pains that are a burning in nature. Pt reports an acid type taste. Pt denies any hx of GERD.

## 2019-01-23 NOTE — Discharge Instructions (Addendum)
You have been seen today for abdominal pain. Please read and follow all provided instructions.  omep 1. Medications: omeprazole, usual home medications 2. Treatment: rest, drink plenty of fluids 3. Follow Up: Please follow up with your primary doctor in 2-5 days for discussion of your diagnoses and further evaluation after today's visit; if you do not have a primary care doctor use the resource guide provided to find one; Please return to the ER for any new or worsening symptoms. Please obtain all of your results from medical records or have your doctors office obtain the results - share them with your doctor - you should be seen at your doctors office. Call today to arrange your follow up.   Take medications as prescribed. Please review all of the medicines and only take them if you do not have an allergy to them. Return to the emergency room for worsening condition or new concerning symptoms. Follow up with your regular doctor. If you don't have a regular doctor use one of the numbers below to establish a primary care doctor.  Please be aware that if you are taking birth control pills, taking other prescriptions, ESPECIALLY ANTIBIOTICS may make the birth control ineffective - if this is the case, either do not engage in sexual activity or use alternative methods of birth control such as condoms until you have finished the medicine and your family doctor says it is OK to restart them. If you are on a blood thinner such as COUMADIN, be aware that any other medicine that you take may cause the coumadin to either work too much, or not enough - you should have your coumadin level rechecked in next 7 days if this is the case.  ?  It is also a possibility that you have an allergic reaction to any of the medicines that you have been prescribed - Everybody reacts differently to medications and while MOST people have no trouble with most medicines, you may have a reaction such as nausea, vomiting, rash, swelling,  shortness of breath. If this is the case, please stop taking the medicine immediately and contact your physician.  ?  You should return to the ER if you develop severe or worsening symptoms.   Emergency Department Resource Guide 1) Find a Doctor and Pay Out of Pocket Although you won't have to find out who is covered by your insurance plan, it is a good idea to ask around and get recommendations. You will then need to call the office and see if the doctor you have chosen will accept you as a new patient and what types of options they offer for patients who are self-pay. Some doctors offer discounts or will set up payment plans for their patients who do not have insurance, but you will need to ask so you aren't surprised when you get to your appointment.  2) Contact Your Local Health Department Not all health departments have doctors that can see patients for sick visits, but many do, so it is worth a call to see if yours does. If you don't know where your local health department is, you can check in your phone book. The CDC also has a tool to help you locate your state's health department, and many state websites also have listings of all of their local health departments.  3) Find a [facility] If your illness is not likely to be very severe or complicated, you may want to try a walk in clinic. These are popping up all  over the country in pharmacies, drugstores, and shopping centers. They're usually staffed by nurse practitioners or physician assistants that have been trained to treat common illnesses and complaints. They're usually fairly quick and inexpensive. However, if you have serious medical issues or chronic medical problems, these are probably not your best option.  No Primary Care Doctor: Call Health Connect at  [phone] - they can help you locate a primary care doctor that  accepts your insurance, provides certain services, etc. Physician Referral [phone]  Emergency  Department Resource Guide 1) Find a Doctor and Pay Out of Pocket Although you won't have to find out who is covered by your insurance plan, it is a good idea to ask around and get recommendations. You will then need to call the office and see if the doctor you have chosen will accept you as a new patient and what types of options they offer for patients who are self-pay. Some doctors offer discounts or will set up payment plans for their patients who do not have insurance, but you will need to ask so you aren't surprised when you get to your appointment.  2) Contact Your Local Health Department Not all health departments have doctors that can see patients for sick visits, but many do, so it is worth a call to see if yours does. If you don't know where your local health department is, you can check in your phone book. The CDC also has a tool to help you locate your state's health department, and many state websites also have listings of all of their local health departments.  3) Find a [facility] If your illness is not likely to be very severe or complicated, you may want to try a walk in clinic. These are popping up all over the country in pharmacies, drugstores, and shopping centers. They're usually staffed by nurse practitioners or physician assistants that have been trained to treat common illnesses and complaints. They're usually fairly quick and inexpensive. However, if you have serious medical issues or chronic medical problems, these are probably not your best option.  No Primary Care Doctor: Call Health Connect at  [phone] - they can help you locate a primary care doctor that  accepts your insurance, provides certain services, etc. Physician Referral Service- [phone]  Chronic Pain Problems: Organization         Address  Phone   Notes  [facility]  [phone] Patients need to be referred by their primary care doctor.   Medication  Assistance: Organization         Address  Phone   Notes  [facility] Medication [facility] [address] [place], [place] [address] [phone] --Must be a resident of [facility] -- Must have NO insurance coverage whatsoever (no Medicaid/ Medicare, etc.) -- The pt. MUST have a primary care doctor that directs their care regularly and follows them in the community   [facility]  [phone]   [employer]  [phone]    Agencies that provide inexpensive medical care: Organization         Address  Phone   Notes  [place]  [phone]   [facility]    [phone]   [facility] [place], [place] [address] [phone]   [place] [address], [place] [phone]   [facility]    [phone]  [facility]    [phone]   [facility]  [address], [place] Phone:  [phone], Fax:  [phone] Hours of Operation:  9 am - 6 pm, M-F.  Also accepts Medicaid/Medicare and self-pay.  [facility] for [address], [address], [place] Phone: [phone], Fax: [phone]. Hours of Operation:  8:30 am - 5:30 pm, M-F.  Also accepts Medicaid and self-pay.  [facility] [place] [address], [place] Phone: [phone]   [place], [place], [place] [phone], [address]. [phone] Mondays & Thursdays: 7-9 AM.  First 15 patients are seen on a first come, first serve basis.    [place] Providers:  Organization         Address  Phone   Notes  [facility] [address], [place] [phone] Also accepts self-pay patients.  [facility] [address], [place]  [phone]   [place], [address], [place]  [phone]   [facility] [address] [place] [phone]   Lucianne Lei [address], [place]   [phone] Only accepts [place] [facility] [place] patients after they have their name applied to their card.   Self-Pay (no insurance) in [facility]:  Organization         Address  Phone   Notes  Sickle Cell Patients, [facility] [phone]   [facility] [place] [phone]   [facility] [place]  [place], [place], [place] [phone]   [facility]/Dr. Osei-Bonsu  [address] [place] or [address], [place] [phone] Phone number for both [place] and [place] locations is the same.  [facility] and [facility] [address], [place] [phone]   [facility] [address], [place] or [address] [phone]   [facility] [address], [place] [phone], phone; [phone], fax Sees patients 1st and 3rd Saturday of every month.  Must not qualify for public or private insurance (i.e. Medicaid, Medicare, [place] Health Choice, Veterans' Benefits)  Household income should be no more than 200% of the poverty level The clinic cannot treat you if you are pregnant or think you are pregnant  Sexually transmitted diseases are not treated at the clinic.

## 2019-01-23 NOTE — ED Provider Notes (Signed)
[facility] EMERGENCY DEPARTMENT Provider Note   CSN: [id number] Arrival date & time: 01/23/19  1156    History   Chief Complaint Chief Complaint  Patient presents with  . Abdominal Pain    HPI Melissa Harding is a 58 y.o. female presenting with intermittent epigastric non radiating abdominal pain onset yesterday. Husband is a contributing historian. Patient describes pain as a burning, sharp, acid-taste pain. Patient states she took Advil without relief. Patient states nothing makes symptoms better or worse. Patient reports she stopped taking Phentermine 2 weeks ago and states she has had intermittent constipation and loose stools. Patient states last BM was yesterday. Patient states she took Phentermine for 1 month and lost 10lbs. Patient reports loss of appetite, but denies nausea, vomiting, or diarrhea. Patient denies sick contacts. Patient reports occasional alcohol use, but denies tobacco or drug use. Patient reports she traveled to [place] in 11/2018. Patient reports abdominal surgeries include appendectomy, c-section, ectopic pregnancies, and laparscopic tubal ligation. LMP 7 years ago. Patient denies fever, cough, congestion, chest pain, or shortness of breath.     HPI  History reviewed. No pertinent past medical history.  Patient Active Problem List   Diagnosis Date Noted  . MENOPAUSAL SYNDROME 03/05/2009  . MUSCLE SPASM 03/05/2009  . DYSPHAGIA UNSPECIFIED 03/05/2009  . TOBACCO ABUSE 03/05/2008  . GERD 10/14/2004    Past Surgical History:  Procedure Laterality Date  . APPENDECTOMY    . CESAREAN SECTION     2 previous  . ECTOPIC PREGNANCY SURGERY     x2  . LAPAROSCOPIC TUBAL LIGATION    . TUBAL LIGATION       OB History    Gravida  6   Para      Term      Preterm      AB  4   Living  2     SAB      TAB  2   Ectopic  2   Multiple      Live Births  2            Home Medications    Prior to Admission medications     Medication Sig Start Date End Date Taking? Authorizing Provider  tiZANidine (ZANAFLEX) 2 MG tablet Take 1 tablet (2 mg total) by mouth at bedtime. Patient taking differently: Take 2 mg by mouth at bedtime as needed for muscle spasms.  12/06/18 03/06/19 Yes Gildardo Pounds, NP  omeprazole (PRILOSEC) 20 MG capsule Take 1 capsule (20 mg total) by mouth daily. 01/23/19   Darlin Drop P, PA-C  phentermine (ADIPEX-P) 37.5 MG tablet Take 1 tablet (37.5 mg total) by mouth daily before breakfast for 30 days. Patient not taking: Reported on 01/23/2019 12/06/18 01/05/19  Gildardo Pounds, NP    Family History Family History  Problem Relation Age of Onset  . Hypertension Mother   . Hypertension Father   . Cancer Paternal Grandmother   . Heart attack Paternal Grandfather     Social History Social History   Tobacco Use  . Smoking status: Never Smoker  . Smokeless tobacco: Never Used  Substance Use Topics  . Alcohol use: Yes    Comment: rarely a glass of wine  . Drug use: No     Allergies   Ampicillin and Percocet [oxycodone-acetaminophen]   Review of Systems Review of Systems  Constitutional: Negative for activity change, appetite change, chills, fever and unexpected weight change.  HENT: Negative for congestion, rhinorrhea and  sore throat.   Eyes: Negative for visual disturbance.  Respiratory: Negative for cough and shortness of breath.   Cardiovascular: Negative for chest pain.  Gastrointestinal: Positive for abdominal pain and constipation. Negative for diarrhea, nausea and vomiting.       Pt reports intermittent loose stools.  Endocrine: Negative for polydipsia, polyphagia and polyuria.  Genitourinary: Negative for dysuria, flank pain and frequency.  Musculoskeletal: Negative for back pain.  Skin: Negative for rash.  Allergic/Immunologic: Negative for immunocompromised state.  Psychiatric/Behavioral: The patient is not nervous/anxious.      Physical Exam Updated Vital  Signs BP 128/82   Pulse 70   Temp 97.8 F (36.6 C) (Oral)   Resp 16   Ht 5\' 2"  (1.575 m)   Wt 83.5 kg   LMP  (Exact Date)   SpO2 98%   BMI 33.65 kg/m   Physical Exam Vitals signs and nursing note reviewed.  Constitutional:      General: She is not in acute distress.    Appearance: She is well-developed. She is not diaphoretic.  HENT:     Head: Normocephalic and atraumatic.     Mouth/Throat:     Mouth: Mucous membranes are moist.     Pharynx: No oropharyngeal exudate.  Eyes:     General: No scleral icterus.    Extraocular Movements: Extraocular movements intact.     Pupils: Pupils are equal, round, and reactive to light.  Neck:     Musculoskeletal: Normal range of motion and neck supple.  Cardiovascular:     Rate and Rhythm: Normal rate and regular rhythm.     Heart sounds: Normal heart sounds. No murmur. No friction rub. No gallop.   Pulmonary:     Effort: Pulmonary effort is normal. No respiratory distress.     Breath sounds: Normal breath sounds. No wheezing or rales.  Abdominal:     General: Abdomen is protuberant. A surgical scar is present. Bowel sounds are normal. There is no distension.     Palpations: Abdomen is soft. Abdomen is not rigid. There is no mass.     Tenderness: There is abdominal tenderness in the epigastric area. There is no right CVA tenderness, left CVA tenderness, guarding or rebound. Negative signs include Murphy's sign.     Hernia: No hernia is present.  Musculoskeletal: Normal range of motion.  Skin:    Findings: No rash.  Neurological:     Mental Status: She is alert and oriented to person, place, and time.    ED Treatments / Results  Labs (all labs ordered are listed, but only abnormal results are displayed) Labs Reviewed  COMPREHENSIVE METABOLIC PANEL - Abnormal; Notable for the following components:      Result Value   Total Bilirubin 0.1 (*)    All other components within normal limits  CBC - Abnormal; Notable for the following  components:   Platelets 407 (*)    All other components within normal limits  URINALYSIS, ROUTINE W REFLEX MICROSCOPIC - Abnormal; Notable for the following components:   APPearance HAZY (*)    Leukocytes,Ua SMALL (*)    Bacteria, UA RARE (*)    All other components within normal limits  URINE CULTURE  LIPASE, BLOOD  I-STAT BETA HCG BLOOD, ED ([facility], [facility], [facility] ONLY)    EKG None  Radiology No results found.  Procedures Procedures (including critical care time)  Medications Ordered in ED Medications  sodium chloride flush (NS) 0.9 % injection 3 mL (3 mLs Intravenous Not Given  01/23/19 1214)  alum & mag hydroxide-simeth (MAALOX/MYLANTA) 200-200-20 MG/5ML suspension 30 mL (30 mLs Oral Given 01/23/19 1244)     Initial Impression / Assessment and Plan / ED Course  I have reviewed the triage vital signs and the nursing notes.  Pertinent labs & imaging results that were available during my care of the patient were reviewed by me and considered in my medical decision making (see chart for details).  Clinical Course as of Jan 23 1340  Tue Jan 23, 2019  1240 WBCs are within normal limits.  WBC: 9.5 [AH]    Clinical Course User Index [AH] Arville Lime, PA-C      Patient is nontoxic, nonseptic appearing, in no apparent distress.  Patient's pain and other symptoms adequately managed in emergency department. Labs, imaging and vitals reviewed. Provided GI cocktail and symptoms improved. Patient does not meet the SIRS or Sepsis criteria.  On repeat exam patient does not have a surgical abdomin and there are no peritoneal signs.  No indication of appendicitis, bowel obstruction, bowel perforation, cholecystitis, diverticulitis, PID or ectopic pregnancy. Suspect bowel changes are likely due to stopping phentermine. Suspect epigastric pain is likely due to GERD. Patient discharged home with PPI and given strict instructions for follow-up with their primary care physician.  I have also discussed  reasons to return immediately to the ER.  Patient expresses understanding and agrees with plan.  Findings and plan of care discussed with supervising physician Dr. Maryan Rued.  Final Clinical Impressions(s) / ED Diagnoses   Final diagnoses:  Epigastric pain    ED Discharge Orders         Ordered    omeprazole (PRILOSEC) 20 MG capsule  Daily     01/23/19 [address] [place], [place] 01/23/19 1342    Blanchie Dessert, MD 01/26/19 [phone]

## 2019-01-23 NOTE — ED Notes (Signed)
ED Provider at bedside. 

## 2019-01-23 NOTE — ED Notes (Signed)
Microbiology notified of need to run urine culture.

## 2019-01-24 LAB — URINE CULTURE

## 2019-02-05 ENCOUNTER — Inpatient Hospital Stay (HOSPITAL_BASED_OUTPATIENT_CLINIC_OR_DEPARTMENT_OTHER): Payer: BLUE CROSS/BLUE SHIELD | Admitting: Family

## 2019-02-05 ENCOUNTER — Inpatient Hospital Stay: Payer: BLUE CROSS/BLUE SHIELD | Attending: Hematology & Oncology

## 2019-02-05 ENCOUNTER — Other Ambulatory Visit: Payer: Self-pay

## 2019-02-05 ENCOUNTER — Encounter: Payer: Self-pay | Admitting: Family

## 2019-02-05 ENCOUNTER — Telehealth: Payer: Self-pay | Admitting: Family

## 2019-02-05 DIAGNOSIS — Z809 Family history of malignant neoplasm, unspecified: Secondary | ICD-10-CM

## 2019-02-05 DIAGNOSIS — Z8371 Family history of colonic polyps: Secondary | ICD-10-CM | POA: Diagnosis not present

## 2019-02-05 DIAGNOSIS — D72829 Elevated white blood cell count, unspecified: Secondary | ICD-10-CM | POA: Diagnosis not present

## 2019-02-05 DIAGNOSIS — D72820 Lymphocytosis (symptomatic): Secondary | ICD-10-CM | POA: Insufficient documentation

## 2019-02-05 LAB — CMP (CANCER CENTER ONLY)
ALK PHOS: 51 U/L (ref 38–126)
ALT: 7 U/L (ref 0–44)
AST: 12 U/L — ABNORMAL LOW (ref 15–41)
Albumin: 4.7 g/dL (ref 3.5–5.0)
Anion gap: 8 (ref 5–15)
BUN: 18 mg/dL (ref 6–20)
CALCIUM: 9.7 mg/dL (ref 8.9–10.3)
CO2: 29 mmol/L (ref 22–32)
Chloride: 104 mmol/L (ref 98–111)
Creatinine: 0.82 mg/dL (ref 0.44–1.00)
GFR, Est AFR Am: >60 mL/min (ref >60)
GFR, Estimated: >60 mL/min (ref >60)
Glucose, Bld: 97 mg/dL (ref 70–99)
Potassium: 4.7 mmol/L (ref 3.5–5.1)
Sodium: 141 mmol/L (ref 135–145)
Total Bilirubin: 0.3 mg/dL (ref 0.3–1.2)
Total Protein: 7 g/dL (ref 6.5–8.1)

## 2019-02-05 LAB — CBC WITH DIFFERENTIAL (CANCER CENTER ONLY)
Abs Immature Granulocytes: 0.02 10*3/uL (ref 0.00–0.07)
Basophils Absolute: 0.1 10*3/uL (ref 0.0–0.1)
Basophils Relative: 1 %
EOS PCT: 2 %
Eosinophils Absolute: 0.3 10*3/uL (ref 0.0–0.5)
HEMATOCRIT: 43.4 % (ref 36.0–46.0)
Hemoglobin: 13.8 g/dL (ref 12.0–15.0)
Immature Granulocytes: 0 %
LYMPHS ABS: 7 10*3/uL — AB (ref 0.7–4.0)
Lymphocytes Relative: 58 %
MCH: 29.7 pg (ref 26.0–34.0)
MCHC: 31.8 g/dL (ref 30.0–36.0)
MCV: 93.5 fL (ref 80.0–100.0)
Monocytes Absolute: 0.4 10*3/uL (ref 0.1–1.0)
Monocytes Relative: 3 %
Neutro Abs: 4.3 10*3/uL (ref 1.7–7.7)
Neutrophils Relative %: 36 %
Platelet Count: 455 10*3/uL — ABNORMAL HIGH (ref 150–400)
RBC: 4.64 MIL/uL (ref 3.87–5.11)
RDW: 13.2 % (ref 11.5–15.5)
WBC Count: 12 10*3/uL — ABNORMAL HIGH (ref 4.0–10.5)
nRBC: 0 % (ref 0.0–0.2)

## 2019-02-05 LAB — SAVE SMEAR(SSMR), FOR PROVIDER SLIDE REVIEW

## 2019-02-05 NOTE — Telephone Encounter (Signed)
Tried calling but rings then goes to busy regarding 3/23 appt

## 2019-02-05 NOTE — Progress Notes (Signed)
Hematology/Oncology Consultation   Name: Melissa Harding      MRN: [id number]    Location: Room/bed info not found  Date: 02/05/2019 Time:2:33 PM   REFERRING PHYSICIAN: Geryl Rankins, NP  REASON FOR CONSULT:  Leukocytosis    DIAGNOSIS: Leukocytosis  HISTORY OF PRESENT ILLNESS: Melissa Harding is a very pleasant 58 yo African American female with history of leukocytosis off and on over the last 4 years.  She denies fatigue. WBC count today is 12.0, Hgb 13.8 and platelet count is 455.  She denies any problem with infections. No fever, chills, n/v, cough, rash, dizziness, SOB, chest pain, palpitations, abdominal pain or changes in bowel or bladder habits.  She states that she has arthritis in both knees.  She denies personal history of diabetes or thyroid problems.  She denies any personal or familial history of sickle cell disease/trait. No personal or familial history of cancer that she is aware of.  She states that she needs to schedule her first colonoscopy. She states that her brother had his last year and had 5 polyps removed.  She had her mammogram in January which was negative.  She has had surgery in the past including an appendectomy, tubal ligation and exploratory lap with scar tissue removal.   No episodes of bleeding, no bruising or petechiae.  She does not smoke or do recreational drugs. She enjoys a glass of wine occasionally when out socially.  She stopped taking her Phentermine in February. She feels that her appetite has returned. She admits that she needs to better hydrate with water daily.  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   ROS: All other 10 point review of systems is negative.   PAST MEDICAL HISTORY:   History reviewed. No pertinent past medical history.  ALLERGIES: Allergies  Allergen Reactions  . Ampicillin Other (See Comments)    REACTION: seizures Did it involve swelling of the face/tongue/throat, SOB, or low BP? No Did it  involve sudden or severe rash/hives, skin peeling, or any reaction on the inside of your mouth or nose? No Did you need to seek medical attention at a hospital or doctor's office? Yes When did it last happen?36 yrs ago If all above answers are "NO", may proceed with cephalosporin use.  Marland Kitchen Percocet [Oxycodone-Acetaminophen] Nausea Only      MEDICATIONS:  No current outpatient medications on file prior to visit.   No current facility-administered medications on file prior to visit.      PAST SURGICAL HISTORY Past Surgical History:  Procedure Laterality Date  . APPENDECTOMY    . CESAREAN SECTION     2 previous  . ECTOPIC PREGNANCY SURGERY     x2  . LAPAROSCOPIC TUBAL LIGATION    . TUBAL LIGATION      FAMILY HISTORY: Family History  Problem Relation Age of Onset  . Hypertension Mother   . Hypertension Father   . Cancer Paternal Grandmother   . Heart attack Paternal Grandfather     SOCIAL HISTORY:  reports that she has never smoked. She has never used smokeless tobacco. She reports current alcohol use. She reports that she does not use drugs.  PERFORMANCE STATUS: The patient's performance status is 0 - Asymptomatic  PHYSICAL EXAM: Most Recent Vital Signs: Blood pressure (!) 145/89, pulse 77, temperature 99 F (37.2 C), temperature source Oral, resp. rate 18, weight 188 lb (85.3 kg), SpO2 100 %. BP (!) 145/89 (Patient Position: Sitting)   Pulse  77   Temp 99 F (37.2 C) (Oral)   Resp 18   Wt 188 lb (85.3 kg)   SpO2 100%   BMI 34.39 kg/m   General Appearance:    Alert, cooperative, no distress, appears stated age  Head:    Normocephalic, without obvious abnormality, atraumatic  Eyes:    PERRL, conjunctiva/corneas clear, EOM's intact, fundi    benign, both eyes        Throat:   Lips, mucosa, and tongue normal; teeth and gums normal  Neck:   Supple, symmetrical, trachea midline, no adenopathy;    thyroid:  no enlargement/tenderness/nodules; no carotid   bruit  or JVD  Back:     Symmetric, no curvature, ROM normal, no CVA tenderness  Lungs:     Clear to auscultation bilaterally, respirations unlabored  Chest Wall:    No tenderness or deformity   Heart:    Regular rate and rhythm, S1 and S2 normal, no murmur, rub   or gallop     Abdomen:     Soft, non-tender, bowel sounds active all four quadrants,    no masses, no organomegaly        Extremities:   Extremities normal, atraumatic, no cyanosis or edema  Pulses:   2+ and symmetric all extremities  Skin:   Skin color, texture, turgor normal, no rashes or lesions  Lymph nodes:   Cervical, supraclavicular, and axillary nodes normal  Neurologic:   CNII-XII intact, normal strength, sensation and reflexes    throughout    LABORATORY DATA:  Results for orders placed or performed in visit on 02/05/19 (from the past 48 hour(s))  CBC with Differential (Cancer Center Only)     Status: Abnormal   Collection Time: 02/05/19  1:31 PM  Result Value Ref Range   WBC Count 12.0 (H) 4.0 - 10.5 K/uL   RBC 4.64 3.87 - 5.11 MIL/uL   Hemoglobin 13.8 12.0 - 15.0 g/dL   HCT 43.4 36.0 - 46.0 %   MCV 93.5 80.0 - 100.0 fL   MCH 29.7 26.0 - 34.0 pg   MCHC 31.8 30.0 - 36.0 g/dL   RDW 13.2 11.5 - 15.5 %   Platelet Count 455 (H) 150 - 400 K/uL   nRBC 0.0 0.0 - 0.2 %   Neutrophils Relative % 36 %   Neutro Abs 4.3 1.7 - 7.7 K/uL   Lymphocytes Relative 58 %    Comment: variant lymphs   Lymphs Abs 7.0 (H) 0.7 - 4.0 K/uL   Monocytes Relative 3 %   Monocytes Absolute 0.4 0.1 - 1.0 K/uL   Eosinophils Relative 2 %   Eosinophils Absolute 0.3 0.0 - 0.5 K/uL   Basophils Relative 1 %   Basophils Absolute 0.1 0.0 - 0.1 K/uL   Immature Granulocytes 0 %   Abs Immature Granulocytes 0.02 0.00 - 0.07 K/uL    Comment: Performed at [facility], [address] [place], [place] [address]  Save Smear [facility])     Status: None   Collection Time: 02/05/19  1:31 PM  Result Value Ref Range    Smear Review SMEAR STAINED AND AVAILABLE FOR REVIEW     Comment: Performed at [facility] at [facility], [address] [place], [place] [address]  CMP ([facility] only)     Status: Abnormal   Collection Time: 02/05/19  1:31 PM  Result Value Ref Range   Sodium 141 135 -  145 mmol/L   Potassium 4.7 3.5 - 5.1 mmol/L   Chloride 104 98 - 111 mmol/L   CO2 29 22 - 32 mmol/L   Glucose, Bld 97 70 - 99 mg/dL   BUN 18 6 - 20 mg/dL   Creatinine 0.82 0.44 - 1.00 mg/dL   Calcium 9.7 8.9 - 10.3 mg/dL   Total Protein 7.0 6.5 - 8.1 g/dL   Albumin 4.7 3.5 - 5.0 g/dL   AST 12 (L) 15 - 41 U/L   ALT 7 0 - 44 U/L   Alkaline Phosphatase 51 38 - 126 U/L   Total Bilirubin 0.3 0.3 - 1.2 mg/dL   GFR, Est Non Af Am >60 >60 mL/min   GFR, Est AFR Am >60 >60 mL/min   Anion gap 8 5 - 15    Comment: Performed at [facility] at [facility], [address], [place], [place] [address]      RADIOGRAPHY: No results found.     PATHOLOGY: None  ASSESSMENT/PLAN: Ms. Simmonds is a very pleasant 58 yo [place] American female with history of leukocytosis off and on over the last 4 years.  She continues to do well and has no complaints at this time.  We will see what her flow cytometry shows. If this is positive for CLL we will follow-up in 6 months. If negative, we can let her go from our office.   All questions were answered and she is in agreement with the plan. She will contact our office with any questions or concerns. We can certainly see her sooner if need be.  She was discussed with and also seen by Dr. Marin Olp and he is in agreement with the aforementioned.   Laverna Peace, NP     Addendum: I saw and examined Ms. Brigitte Pulse.  She is very nice.  It was a lot of fun talking to her.  I looked at her blood smear under the microscope.  She did have a slight increase in lymphocytes.  A lot of the lymphocytes were large.  It is hard to say if these are reactive  lymphocytes.  I do think that we have to send off flow cytometry to make sure that there is no lymphoproliferative disorder.  I do not see anything else on the blood smear that looked suspicious.  There is nothing on her exam that is remarkable.  There is no adenopathy.  There is no splenomegaly.  I think that if her flow cytometry is negative for a monoclonal population of cells, I just am not sure that we have to follow her up again.  If the flow cytometry is positive for a monoclonal lymphocyte population, she might possibly have monoclonal B-cell lymphocytosis which can potentially transformed into CLL.  If this is the case, I probably would see her back in 6 months.  We spent about 45 minutes with her.  We counseled her.  We reviewed her labs.  We talked about her blood smear.  We answered all of her questions.   Lattie Haw, MD

## 2019-02-06 ENCOUNTER — Telehealth: Payer: Self-pay | Admitting: Family

## 2019-02-06 LAB — LACTATE DEHYDROGENASE: LDH: 209 U/L — ABNORMAL HIGH (ref 98–192)

## 2019-02-06 NOTE — Telephone Encounter (Signed)
NO LOS FOR 3/23 VISIT

## 2019-02-07 ENCOUNTER — Telehealth: Payer: Self-pay | Admitting: Family

## 2019-02-07 NOTE — Telephone Encounter (Signed)
Appointments scheduled unable to reach patient by phone.  Letter/Calendar mailed asking patient to call the office once she received the letter/ per 3/25 sch msg

## 2019-02-07 NOTE — Telephone Encounter (Signed)
Called both home and cell phone numbers, no voicemail available for either number. Will try again later to give results for CLL.

## 2019-02-12 LAB — FLOW CYTOMETRY

## 2019-04-18 ENCOUNTER — Encounter: Payer: Self-pay | Admitting: Gastroenterology

## 2019-04-23 ENCOUNTER — Ambulatory Visit: Payer: BLUE CROSS/BLUE SHIELD | Admitting: Nurse Practitioner

## 2019-05-07 ENCOUNTER — Telehealth: Payer: Self-pay | Admitting: *Deleted

## 2019-05-07 NOTE — Telephone Encounter (Signed)
Pt had a 9 am Pre Visit- Called pt at 0855- rang many times,  no answer, no voice mail to LM.  Called pt back at 0904 am, rang many times, no answer, no voice mail. April, front desk called and states pt's [employer] is out of network with our office- - she states pt will need to cancel due to this- unable to reach pt. Called pt again at 0912, rang many times, no answer- no vm-  Mailed NS letter, including we are out of network, cancelled colon and PV today

## 2019-05-08 ENCOUNTER — Other Ambulatory Visit: Payer: Self-pay

## 2019-05-08 ENCOUNTER — Ambulatory Visit: Payer: BLUE CROSS/BLUE SHIELD | Attending: Nurse Practitioner | Admitting: Nurse Practitioner

## 2019-05-08 DIAGNOSIS — Z111 Encounter for screening for respiratory tuberculosis: Secondary | ICD-10-CM

## 2019-05-08 NOTE — Progress Notes (Signed)
Patient arrived ambulatory, alert and orientated to clinic.  Patient is in clinic for for TB test.  Patient given medication. Patient tolerated injection well. Mdication given in left forearm  Patient advise to Come back in 48 hours.  Patient advised if she had an allergic reaction to go to the ED.    Patient acknowledged understanding of advice.

## 2019-05-10 ENCOUNTER — Ambulatory Visit: Payer: BLUE CROSS/BLUE SHIELD | Attending: Nurse Practitioner | Admitting: Emergency Medicine

## 2019-05-10 ENCOUNTER — Other Ambulatory Visit: Payer: Self-pay

## 2019-05-10 VITALS — HR 84 | Temp 98.2°F | Resp 18

## 2019-05-10 DIAGNOSIS — Z111 Encounter for screening for respiratory tuberculosis: Secondary | ICD-10-CM

## 2019-05-10 NOTE — Progress Notes (Signed)
Patient arrived to have PPD test read.  Test was negative and patient given letter stating such

## 2019-05-17 ENCOUNTER — Telehealth: Payer: Self-pay | Admitting: Nurse Practitioner

## 2019-05-17 NOTE — Telephone Encounter (Signed)
Patient called stating they would like to get a colonoscopy referral. Please follow up.

## 2019-05-17 NOTE — Telephone Encounter (Signed)
Will route to PCP 

## 2019-05-19 ENCOUNTER — Other Ambulatory Visit: Payer: Self-pay | Admitting: Nurse Practitioner

## 2019-05-19 DIAGNOSIS — Z1211 Encounter for screening for malignant neoplasm of colon: Secondary | ICD-10-CM

## 2019-05-19 NOTE — Telephone Encounter (Signed)
Referral placed.

## 2019-05-21 ENCOUNTER — Encounter: Payer: Self-pay | Admitting: Gastroenterology

## 2019-05-21 ENCOUNTER — Encounter: Payer: BLUE CROSS/BLUE SHIELD | Admitting: Gastroenterology

## 2019-05-31 ENCOUNTER — Telehealth: Payer: Self-pay | Admitting: Nurse Practitioner

## 2019-05-31 NOTE — Telephone Encounter (Signed)
Pt states provider Raul Del referred to for colonoscopy does not take her ins. Pt req send referral for cololnoscopy to [facility]. Ph [phone] FAX [phone] Fredia Beets.

## 2019-06-04 ENCOUNTER — Encounter: Payer: BLUE CROSS/BLUE SHIELD | Admitting: Gastroenterology

## 2019-06-04 NOTE — Telephone Encounter (Signed)
Thank you. Noted.

## 2019-06-25 ENCOUNTER — Encounter: Payer: BLUE CROSS/BLUE SHIELD | Admitting: Gastroenterology

## 2019-07-30 ENCOUNTER — Ambulatory Visit: Payer: BLUE CROSS/BLUE SHIELD | Attending: Nurse Practitioner | Admitting: Nurse Practitioner

## 2019-07-30 ENCOUNTER — Other Ambulatory Visit: Payer: Self-pay

## 2019-07-30 ENCOUNTER — Encounter: Payer: Self-pay | Admitting: Nurse Practitioner

## 2019-07-30 DIAGNOSIS — M7989 Other specified soft tissue disorders: Secondary | ICD-10-CM | POA: Diagnosis not present

## 2019-07-30 DIAGNOSIS — L309 Dermatitis, unspecified: Secondary | ICD-10-CM

## 2019-07-30 DIAGNOSIS — L308 Other specified dermatitis: Secondary | ICD-10-CM

## 2019-07-30 DIAGNOSIS — N6311 Unspecified lump in the right breast, upper outer quadrant: Secondary | ICD-10-CM

## 2019-07-30 DIAGNOSIS — N63 Unspecified lump in unspecified breast: Secondary | ICD-10-CM

## 2019-07-30 MED ORDER — PREDNISONE 20 MG PO TABS: 20.0000 mg | ORAL_TABLET | Freq: Every day | ORAL | 0 refills | Status: AC

## 2019-07-30 NOTE — Progress Notes (Signed)
Virtual Visit via Telephone Note Due to national recommendations of social distancing due to [place] 19, telehealth visit is felt to be most appropriate for this patient at this time.  I discussed the limitations, risks, security and privacy concerns of performing an evaluation and management service by telephone and the availability of in person appointments. I also discussed with the patient that there may be a patient responsible charge related to this service. The patient expressed understanding and agreed to proceed.    I connected with Melissa Harding on 07/31/19  at   4:10 PM EDT  EDT by telephone and verified that I am speaking with the correct person using two identifiers.   Consent I discussed the limitations, risks, security and privacy concerns of performing an evaluation and management service by telephone and the availability of in person appointments. I also discussed with the patient that there may be a patient responsible charge related to this service. The patient expressed understanding and agreed to proceed.   Location of Patient: Private  Residence   Location of Provider: [place] [facility] [place] participating in Telemedicine visit: Geryl Rankins FNP-BC [place]    History of Present Illness: Telemedicine visit for: Skin Rash   Rash: Patient complains of rash involving the chest, neck and shoulders. Rash started several days ago.  Discomfort associated with rash: is pruritic.  Associated symptoms: none. Denies: fever. Patient has had previous evaluation of rash in the ED on 01-11-2018. Patient has had previous treatment which included prednisone.  Response to treatment: effective. Patient has not had contacts with similar rash. Patient has not identified precipitant. Patient has not had new exposures (soaps, lotions, laundry detergents, foods, medications, plants, insects or animals.) Tried benadryl and hydrocortisone with no  relief of pain.    Breast Problem Endorses increased swelling of the outer quadrant right breast as well as the right axillary area. Onset unknown. Her mammogram this year was normal. She denies any palpable masses or lumps.   History reviewed. No pertinent past medical history.  Past Surgical History:  Procedure Laterality Date  . APPENDECTOMY    . CESAREAN SECTION     2 previous  . ECTOPIC PREGNANCY SURGERY     x2  . LAPAROSCOPIC TUBAL LIGATION    . TUBAL LIGATION      Family History  Problem Relation Age of Onset  . Hypertension Mother   . Hypertension Father   . Cancer Paternal Grandmother   . Heart attack Paternal Grandfather     Social History   Socioeconomic History  . Marital status: Married    Spouse name: steven  . Number of children: 2  . Years of education: 82  . Highest education level: Not on file  Occupational History  . Occupation: part time    Comment: self employed  Social Needs  . Financial resource strain: Not on file  . Food insecurity    Worry: Not on file    Inability: Not on file  . Transportation needs    Medical: Not on file    Non-medical: Not on file  Tobacco Use  . Smoking status: Never Smoker  . Smokeless tobacco: Never Used  Substance and Sexual Activity  . Alcohol use: Yes    Comment: rarely a glass of wine  . Drug use: No  . Sexual activity: Yes    Birth control/protection: Surgical  Lifestyle  . Physical activity    Days per  week: 0 days    Minutes per session: 0 min  . Stress: Not at all  Relationships  . Social connections    Talks on phone: More than three times a week    Gets together: Once a week    Attends religious service: More than 4 times per year    Active member of club or organization: Yes    Attends meetings of clubs or organizations: More than 4 times per year    Relationship status: Married  Other Topics Concern  . Not on file  Social History Narrative  . Not on file      Observations/Objective: Awake, alert and oriented x 3   Review of Systems  Constitutional: Negative for fever, malaise/fatigue and weight loss.  HENT: Negative.  Negative for nosebleeds.   Eyes: Negative.  Negative for blurred vision, double vision and photophobia.  Respiratory: Negative.  Negative for cough and shortness of breath.   Cardiovascular: Negative.  Negative for chest pain, palpitations and leg swelling.  Gastrointestinal: Negative.  Negative for heartburn, nausea and vomiting.  Musculoskeletal: Negative.  Negative for myalgias.  Skin: Positive for itching and rash.       SEE HPI  Neurological: Negative.  Negative for dizziness, focal weakness, seizures and headaches.  Psychiatric/Behavioral: Negative.  Negative for suicidal ideas.    Assessment and Plan:  Diagnoses and all orders for this visit:  Dermatitis -     predniSONE (DELTASONE) 20 MG tablet; Take 1 tablet (20 mg total) by mouth daily with breakfast for 7 days.  Breast swelling -     Cancel: US BREAST COMPLETE UNI RIGHT INC AXILLA; Future -     US BREAST LTD UNI RIGHT INC AXILLA; Future -     MM Digital Diagnostic Unilat R; Future  Right axillary swelling -     Cancel: US BREAST COMPLETE UNI RIGHT INC AXILLA; Future -     US BREAST LTD UNI RIGHT INC AXILLA; Future -     MM Digital Diagnostic Unilat R; Future     Follow Up Instructions Return if symptoms worsen or fail to improve.     I discussed the assessment and treatment plan with the patient. The patient was provided an opportunity to ask questions and all were answered. The patient agreed with the plan and demonstrated an understanding of the instructions.   The patient was advised to call back or seek an in-person evaluation if the symptoms worsen or if the condition fails to improve as anticipated.  I provided 19 minutes of non-face-to-face time during this encounter including median intraservice time, reviewing previous notes, labs, imaging,  medications and explaining diagnosis and management.  Gildardo Pounds, FNP-BC

## 2019-07-31 ENCOUNTER — Encounter: Payer: Self-pay | Admitting: Nurse Practitioner

## 2019-08-09 ENCOUNTER — Other Ambulatory Visit: Payer: Self-pay | Admitting: Family

## 2019-08-09 DIAGNOSIS — D72829 Elevated white blood cell count, unspecified: Secondary | ICD-10-CM

## 2019-08-09 DIAGNOSIS — D72820 Lymphocytosis (symptomatic): Secondary | ICD-10-CM

## 2019-08-10 ENCOUNTER — Encounter: Payer: Self-pay | Admitting: Family

## 2019-08-10 ENCOUNTER — Other Ambulatory Visit: Payer: Self-pay

## 2019-08-10 ENCOUNTER — Inpatient Hospital Stay: Payer: BLUE CROSS/BLUE SHIELD | Admitting: Family

## 2019-08-10 ENCOUNTER — Inpatient Hospital Stay: Payer: BLUE CROSS/BLUE SHIELD | Attending: Hematology & Oncology

## 2019-08-10 VITALS — BP 154/84 | HR 78 | Temp 97.1°F | Resp 18 | Wt 199.0 lb

## 2019-08-10 DIAGNOSIS — D72820 Lymphocytosis (symptomatic): Secondary | ICD-10-CM

## 2019-08-10 DIAGNOSIS — C9110 Chronic lymphocytic leukemia of B-cell type not having achieved remission: Secondary | ICD-10-CM | POA: Diagnosis not present

## 2019-08-10 DIAGNOSIS — D72829 Elevated white blood cell count, unspecified: Secondary | ICD-10-CM

## 2019-08-10 LAB — CBC WITH DIFFERENTIAL (CANCER CENTER ONLY)
Abs Immature Granulocytes: 0.02 10*3/uL (ref 0.00–0.07)
Basophils Absolute: 0.1 10*3/uL (ref 0.0–0.1)
Basophils Relative: 1 %
Eosinophils Absolute: 0.3 10*3/uL (ref 0.0–0.5)
Eosinophils Relative: 2 %
HCT: 45.1 % (ref 36.0–46.0)
Hemoglobin: 14.1 g/dL (ref 12.0–15.0)
Immature Granulocytes: 0 %
Lymphocytes Relative: 61 %
Lymphs Abs: 8.4 10*3/uL — ABNORMAL HIGH (ref 0.7–4.0)
MCH: 29.3 pg (ref 26.0–34.0)
MCHC: 31.3 g/dL (ref 30.0–36.0)
MCV: 93.6 fL (ref 80.0–100.0)
Monocytes Absolute: 0.5 10*3/uL (ref 0.1–1.0)
Monocytes Relative: 4 %
Neutro Abs: 4.3 10*3/uL (ref 1.7–7.7)
Neutrophils Relative %: 32 %
Platelet Count: 465 10*3/uL — ABNORMAL HIGH (ref 150–400)
RBC: 4.82 MIL/uL (ref 3.87–5.11)
RDW: 13.5 % (ref 11.5–15.5)
WBC Count: 13.6 10*3/uL — ABNORMAL HIGH (ref 4.0–10.5)
nRBC: 0 % (ref 0.0–0.2)

## 2019-08-10 LAB — CMP (CANCER CENTER ONLY)
ALT: 11 U/L (ref 0–44)
AST: 16 U/L (ref 15–41)
Albumin: 4.8 g/dL (ref 3.5–5.0)
Alkaline Phosphatase: 61 U/L (ref 38–126)
Anion gap: 7 (ref 5–15)
BUN: 14 mg/dL (ref 6–20)
CO2: 31 mmol/L (ref 22–32)
Calcium: 10.2 mg/dL (ref 8.9–10.3)
Chloride: 104 mmol/L (ref 98–111)
Creatinine: 0.82 mg/dL (ref 0.44–1.00)
GFR, Est AFR Am: >60 mL/min (ref >60)
GFR, Estimated: >60 mL/min (ref >60)
Glucose, Bld: 96 mg/dL (ref 70–99)
Potassium: 4.8 mmol/L (ref 3.5–5.1)
Sodium: 142 mmol/L (ref 135–145)
Total Bilirubin: 0.4 mg/dL (ref 0.3–1.2)
Total Protein: 7.5 g/dL (ref 6.5–8.1)

## 2019-08-10 LAB — SAVE SMEAR(SSMR), FOR PROVIDER SLIDE REVIEW

## 2019-08-10 NOTE — Progress Notes (Signed)
Hematology and Oncology Follow Up Visit  Melissa Harding [id number] [birth year] 58 y.o. 08/10/2019   Principle Diagnosis:  CLL  Current Therapy:   Observation   Interim History:  Melissa Harding is here today for follow-up. She is doing well and has no complaints at this time.  We went over her diagnosis of CLL in detail including side effects to watch out for.  She is asymptomatic at this time.  She has had no issues with infections. No fever, chills, n/v, cough, rash, dizziness, SOB, chest pain, palpitations, abdominal pain or changes in bowel or bladder habits.  No lymphadenopathy noted on exam.  No selling, tenderness, numbness or tingling in her extremities.  No episodes of bleeding, no bruising or petechiae.  She has a good appetite and is staying well hydrated. Her weight is stable.   ECOG Performance Status: 1 - Symptomatic but completely ambulatory  Medications:  Allergies as of 08/10/2019      Reactions   Ampicillin Other (See Comments)   REACTION: seizures Did it involve swelling of the face/tongue/throat, SOB, or low BP? No Did it involve sudden or severe rash/hives, skin peeling, or any reaction on the inside of your mouth or nose? No Did you need to seek medical attention at a hospital or doctor's office? Yes When did it last happen?36 yrs ago If all above answers are "NO", may proceed with cephalosporin use.   Percocet [oxycodone-acetaminophen] Nausea Only      Medication List    as of August 10, 2019  1:14 PM   You have not been prescribed any medications.     Allergies:  Allergies  Allergen Reactions  . Ampicillin Other (See Comments)    REACTION: seizures Did it involve swelling of the face/tongue/throat, SOB, or low BP? No Did it involve sudden or severe rash/hives, skin peeling, or any reaction on the inside of your mouth or nose? No Did you need to seek medical attention at a hospital or doctor's office? Yes When did it last happen?36 yrs  ago If all above answers are "NO", may proceed with cephalosporin use.  Marland Kitchen Percocet [Oxycodone-Acetaminophen] Nausea Only    Past Medical History, Surgical history, Social history, and Family History were reviewed and updated.  Review of Systems: All other 10 point review of systems is negative.   Physical Exam:  vitals were not taken for this visit.   Wt Readings from Last 3 Encounters:  02/05/19 188 lb (85.3 kg)  01/23/19 184 lb (83.5 kg)  12/20/18 193 lb (87.5 kg)    Ocular: Sclerae unicteric, pupils equal, round and reactive to light Ear-nose-throat: Oropharynx clear, dentition fair Lymphatic: No cervical or supraclavicular adenopathy Lungs no rales or rhonchi, good excursion bilaterally Heart regular rate and rhythm, no murmur appreciated Abd soft, nontender, positive bowel sounds, no liver or spleen tip palpated on exam, no fluid wave  MSK no focal spinal tenderness, no joint edema Neuro: non-focal, well-oriented, appropriate affect Breasts: Deferred   Lab Results  Component Value Date   WBC 13.6 (H) 08/10/2019   HGB 14.1 08/10/2019   HCT 45.1 08/10/2019   MCV 93.6 08/10/2019   PLT 465 (H) 08/10/2019   No results found for: FERRITIN, IRON, TIBC, UIBC, IRONPCTSAT Lab Results  Component Value Date   RBC 4.82 08/10/2019   No results found for: KPAFRELGTCHN, LAMBDASER, KAPLAMBRATIO No results found for: IGGSERUM, IGA, IGMSERUM No results found for: TOTALPROTELP, ALBUMINELP, A1GS, A2GS, BETS, BETA2SER, [place], [place], SPEI   Chemistry  Component Value Date/Time   NA 141 02/05/2019 1331   NA 141 09/05/2018 1526   K 4.7 02/05/2019 1331   CL 104 02/05/2019 1331   CO2 29 02/05/2019 1331   BUN 18 02/05/2019 1331   BUN 13 09/05/2018 1526   CREATININE 0.82 02/05/2019 1331      Component Value Date/Time   CALCIUM 9.7 02/05/2019 1331   ALKPHOS 51 02/05/2019 1331   AST 12 (L) 02/05/2019 1331   ALT 7 02/05/2019 1331   BILITOT 0.3 02/05/2019 1331        Impression and Plan: Melissa Harding is a very pleasant 58 yo African American female with CLL.  She continues to do well and remains asymptomatic. WBC count is 13.6 and platelet count 465. No intervention needed at this time per Dr. Marin Olp.  We will continue to follow along with her and plan to see her back in another 6 months.  She will contact our office with any questions or concerns. We can certainly see her sooner if needed.   Melissa Peace, NP 9/25/20201:14 PM

## 2019-08-13 LAB — LACTATE DEHYDROGENASE: LDH: 189 U/L (ref 98–192)

## 2019-08-23 ENCOUNTER — Telehealth: Payer: Self-pay | Admitting: Nurse Practitioner

## 2019-08-23 NOTE — Telephone Encounter (Signed)
Patient called stating she would like to  Get her imaging done at [facility] radiology and would like to know if orders could be sent over. Please follow up.   States she has taken the prednisone for the rash and it is not working for her. Please follow up.

## 2019-08-27 ENCOUNTER — Other Ambulatory Visit: Payer: Self-pay | Admitting: Nurse Practitioner

## 2019-08-27 DIAGNOSIS — L309 Dermatitis, unspecified: Secondary | ICD-10-CM

## 2019-08-27 NOTE — Telephone Encounter (Signed)
Spoke to patient and inform she needs to call the imaging center and see if they do breast imaging, and call back with the imaging fax number. CMA will fax the imaging orders to the placed once receive a call back.   Will route to PCP regarding rash: Pt. State she still have rash flare up on her chest and her shoulder. The prednisone helps a little bit, but the rash is still there, and it would flare up.

## 2019-08-27 NOTE — Telephone Encounter (Signed)
Will refer to dermatology for rash

## 2019-08-28 NOTE — Telephone Encounter (Signed)
Patient called to provide fax number for the [facility]. Please follow up.   Fax:[phone]

## 2019-08-30 NOTE — Telephone Encounter (Signed)
CMA faxed over the imaging orders to [facility].

## 2019-11-01 ENCOUNTER — Ambulatory Visit (INDEPENDENT_AMBULATORY_CARE_PROVIDER_SITE_OTHER)
Admission: RE | Admit: 2019-11-01 | Discharge: 2019-11-01 | Disposition: A | Discharge status: Home / Self Care | Payer: BLUE CROSS/BLUE SHIELD | Source: Ambulatory Visit

## 2019-11-01 ENCOUNTER — Other Ambulatory Visit: Payer: Self-pay

## 2019-11-01 DIAGNOSIS — J1282 Pneumonia due to coronavirus disease 2019: Secondary | ICD-10-CM | POA: Insufficient documentation

## 2019-11-01 DIAGNOSIS — U071 COVID-19: Secondary | ICD-10-CM

## 2019-11-01 DIAGNOSIS — Z856 Personal history of leukemia: Secondary | ICD-10-CM | POA: Insufficient documentation

## 2019-11-01 DIAGNOSIS — R110 Nausea: Secondary | ICD-10-CM | POA: Diagnosis not present

## 2019-11-01 MED ORDER — ONDANSETRON 4 MG PO TBDP: 4.0000 mg | ORAL_TABLET | Freq: Three times a day (TID) | ORAL | 0 refills | Status: DC | PRN

## 2019-11-01 NOTE — Discharge Instructions (Signed)
Zofran as needed for nausea If your symptoms worsen and your oxygen drops into the 80s you need to go to the ER

## 2019-11-01 NOTE — ED Provider Notes (Signed)
Virtual Visit via Video Note:  LOVA MAILE  initiated request for Telemedicine visit with [facility] team. I connected with [name]  on 11/02/2019 at 3:27 PM  for a synchronized telemedicine visit using a video enabled HIPPA compliant telemedicine application. I verified that I am speaking with [name]  using two identifiers. Orvan July, NP  was physically located in a [facility] Urgent care site and TORYA CAREY was located at a different location.   The limitations of evaluation and management by telemedicine as well as the availability of in-person appointments were discussed. Patient was informed that she  may incur a bill ( including co-pay) for this virtual visit encounter. [name]  expressed understanding and gave verbal consent to proceed with virtual visit.     History of Present Illness:Melissa Harding  is a 58 y.o. female presents with nausea, loss of appetite.  This is been constant for the past 3 days.  Recently diagnosed with Covid and has had some loss of taste and smell.  Denies any sinus congestion or nasal drainage.  Denies any abdominal pain, vomiting or diarrhea.  No fevers.  No chest pain or shortness of breath.  No past medical history on file.  Allergies  Allergen Reactions  . Ampicillin Other (See Comments)    REACTION: seizures Did it involve swelling of the face/tongue/throat, SOB, or low BP? No Did it involve sudden or severe rash/hives, skin peeling, or any reaction on the inside of your mouth or nose? No Did you need to seek medical attention at a hospital or doctor's office? Yes When did it last happen?36 yrs ago If all above answers are "NO", may proceed with cephalosporin use.  Marland Kitchen Percocet [Oxycodone-Acetaminophen] Nausea Only        Observations/Objective:VITALS: Per patient if applicable, see vitals. GENERAL: Alert, appears well and in no acute distress. HEENT: Atraumatic, conjunctiva clear, no obvious  abnormalities on inspection of external nose and ears. NECK: Normal movements of the head and neck. CARDIOPULMONARY: No increased WOB. Speaking in clear sentences. I:E ratio WNL.  MS: Moves all visible extremities without noticeable abnormality. PSYCH: Pleasant and cooperative, well-groomed. Speech normal rate and rhythm. Affect is appropriate. Insight and judgement are appropriate. Attention is focused, linear, and appropriate.  NEURO: CN grossly intact. Oriented as arrived to appointment on time with no prompting. Moves both UE equally.  SKIN: No obvious lesions, wounds, erythema, or cyanosis noted on face or hands.    Assessment and Plan: We will treat nausea with Zofran. Patient not currently having any concerning signs or symptoms.   Follow Up Instructions: Recommended if the nausea continues or she starts feeling any other symptoms to include abdominal pain, vomiting or diarrhea she would need to go straight to the ER.  Patient understanding and agree.    I discussed the assessment and treatment plan with the patient. The patient was provided an opportunity to ask questions and all were answered. The patient agreed with the plan and demonstrated an understanding of the instructions.   The patient was advised to call back or seek an in-person evaluation if the symptoms worsen or if the condition fails to improve as anticipated.    Orvan July, NP  11/02/2019 3:27 PM         Orvan July, NP 11/02/19 1530

## 2019-11-19 ENCOUNTER — Ambulatory Visit: Payer: BLUE CROSS/BLUE SHIELD | Attending: Internal Medicine | Admitting: Internal Medicine

## 2019-11-19 ENCOUNTER — Other Ambulatory Visit: Payer: Self-pay

## 2019-11-19 ENCOUNTER — Encounter: Payer: Self-pay | Admitting: Internal Medicine

## 2019-11-19 DIAGNOSIS — J1282 Pneumonia due to coronavirus disease 2019: Secondary | ICD-10-CM | POA: Diagnosis not present

## 2019-11-19 DIAGNOSIS — U071 COVID-19: Secondary | ICD-10-CM

## 2019-11-19 NOTE — Progress Notes (Signed)
Patient has been called and DOB has been verified. Patient has been screened and transferred to PCP to start phone visit.     

## 2019-11-19 NOTE — Progress Notes (Signed)
Virtual Visit via Telephone Note  I connected with Melissa Harding on 11/19/19 at  3:30 PM EST by telephone and verified that I am speaking with the correct person using two identifiers.  Location: Patient: home Provider: home   I discussed the limitations, risks, security and privacy concerns of performing an evaluation and management service by telephone and the availability of in person appointments. I also discussed with the patient that there may be a patient responsible charge related to this service. The patient expressed understanding and agreed to proceed.   History of Present Illness: F/u hosp for covid.  Pt feeling better, no sob, no f/c, no cough, no fatigue. Starting to get her strength back, walking for exercise w/o assoc sob. Interested in scheduling CPE   Patient Active Problem List   Diagnosis Date Noted  . Lymphocytosis 02/05/2019  . MENOPAUSAL SYNDROME 03/05/2009  . MUSCLE SPASM 03/05/2009  . DYSPHAGIA UNSPECIFIED 03/05/2009  . GERD 10/14/2004     No meds Observations/Objective: Pox 99   Assessment and Plan: covid pneumonia- improved, off meds, continue reconditioning exercises, monitor sx, call if sx worsen. Hm- req cpe, advised to call for appt.  Follow Up Instructions:    I discussed the assessment and treatment plan with the patient. The patient was provided an opportunity to ask questions and all were answered. The patient agreed with the plan and demonstrated an understanding of the instructions.   The patient was advised to call back or seek an in-person evaluation if the symptoms worsen or if the condition fails to improve as anticipated.  I provided 20 minutes of non-face-to-face time during this encounter.   Kimber Relic, MD

## 2020-01-07 ENCOUNTER — Other Ambulatory Visit: Payer: Self-pay | Admitting: Nurse Practitioner

## 2020-01-07 ENCOUNTER — Ambulatory Visit: Payer: BLUE CROSS/BLUE SHIELD

## 2020-01-07 DIAGNOSIS — Z1231 Encounter for screening mammogram for malignant neoplasm of breast: Secondary | ICD-10-CM

## 2020-01-09 ENCOUNTER — Other Ambulatory Visit: Payer: Self-pay

## 2020-01-09 ENCOUNTER — Ambulatory Visit
Admission: RE | Admit: 2020-01-09 | Discharge: 2020-01-09 | Disposition: A | Discharge status: Home / Self Care | Payer: 59 | Source: Ambulatory Visit | Attending: Nurse Practitioner | Admitting: Nurse Practitioner

## 2020-01-09 DIAGNOSIS — Z1231 Encounter for screening mammogram for malignant neoplasm of breast: Secondary | ICD-10-CM

## 2020-02-07 ENCOUNTER — Inpatient Hospital Stay: Payer: 59

## 2020-02-07 ENCOUNTER — Encounter: Payer: Self-pay | Admitting: Hematology & Oncology

## 2020-02-07 ENCOUNTER — Inpatient Hospital Stay: Payer: 59 | Attending: Hematology & Oncology | Admitting: Hematology & Oncology

## 2020-02-07 ENCOUNTER — Other Ambulatory Visit: Payer: Self-pay

## 2020-02-07 VITALS — BP 127/86 | HR 81 | Temp 97.3°F | Resp 18 | Ht 62.21 in | Wt 204.0 lb

## 2020-02-07 DIAGNOSIS — C9110 Chronic lymphocytic leukemia of B-cell type not having achieved remission: Secondary | ICD-10-CM | POA: Diagnosis not present

## 2020-02-07 DIAGNOSIS — Z8616 Personal history of COVID-19: Secondary | ICD-10-CM | POA: Diagnosis not present

## 2020-02-07 DIAGNOSIS — D72820 Lymphocytosis (symptomatic): Secondary | ICD-10-CM

## 2020-02-07 LAB — CBC WITH DIFFERENTIAL (CANCER CENTER ONLY)
Band Neutrophils: 0 %
Basophils Absolute: 0.1 10*3/uL (ref 0.0–0.1)
Basophils Relative: 1 %
Eosinophils Absolute: 0.3 10*3/uL (ref 0.0–0.5)
Eosinophils Relative: 2 %
HCT: 42.4 % (ref 36.0–46.0)
Hemoglobin: 13.5 g/dL (ref 12.0–15.0)
Lymphocytes Relative: 64 %
Lymphs Abs: 8.6 10*3/uL — ABNORMAL HIGH (ref 0.7–4.0)
MCH: 29.3 pg (ref 26.0–34.0)
MCHC: 31.8 g/dL (ref 30.0–36.0)
MCV: 92 fL (ref 80.0–100.0)
Monocytes Absolute: 0.5 10*3/uL (ref 0.1–1.0)
Monocytes Relative: 3 %
Neutro Abs: 4.1 10*3/uL (ref 1.7–7.7)
Neutrophils Relative %: 30 %
Platelet Count: 460 10*3/uL — ABNORMAL HIGH (ref 150–400)
RBC: 4.61 MIL/uL (ref 3.87–5.11)
RDW: 13.8 % (ref 11.5–15.5)
WBC Count: 13.6 10*3/uL — ABNORMAL HIGH (ref 4.0–10.5)
nRBC: 0 % (ref 0.0–0.2)

## 2020-02-07 LAB — CMP (CANCER CENTER ONLY)
ALT: 10 U/L (ref 0–44)
AST: 15 U/L (ref 15–41)
Albumin: 4.6 g/dL (ref 3.5–5.0)
Alkaline Phosphatase: 57 U/L (ref 38–126)
Anion gap: 6 (ref 5–15)
BUN: 17 mg/dL (ref 6–20)
CO2: 29 mmol/L (ref 22–32)
Calcium: 10 mg/dL (ref 8.9–10.3)
Chloride: 106 mmol/L (ref 98–111)
Creatinine: 0.86 mg/dL (ref 0.44–1.00)
GFR, Est AFR Am: >60 mL/min (ref >60)
GFR, Estimated: >60 mL/min (ref >60)
Glucose, Bld: 100 mg/dL — ABNORMAL HIGH (ref 70–99)
Potassium: 4.7 mmol/L (ref 3.5–5.1)
Sodium: 141 mmol/L (ref 135–145)
Total Bilirubin: 0.2 mg/dL — ABNORMAL LOW (ref 0.3–1.2)
Total Protein: 7.1 g/dL (ref 6.5–8.1)

## 2020-02-07 NOTE — Progress Notes (Signed)
Hematology and Oncology Follow Up Visit  Melissa Harding [id number] 1961-10-08 59 y.o. 02/07/2020   Principle Diagnosis:  CLL - Stage A  Current Therapy:   Observation   Interim History:  Melissa Harding is here today for follow-up.  So far, here things going pretty well with her.  Unfortunately, she did have the coronavirus back in December.  She was hospitalized for about 10 days.  She was not intubated.  She was treated with the monoclonal antibody.  She was out of work for about a month.  She is doing well right now.  Hopefully, she will be able to take a vacation this year.  She is not noted any problems with swollen lymph nodes.  She has had no rashes.  She has had no change in bowel or bladder habits.  She has had no headache.  There is been no cough.  Overall, her performance status is ECOG 1.  Medications:  Allergies as of 02/07/2020      Reactions   Ampicillin Other (See Comments)   REACTION: seizures Did it involve swelling of the face/tongue/throat, SOB, or low BP? No Did it involve sudden or severe rash/hives, skin peeling, or any reaction on the inside of your mouth or nose? No Did you need to seek medical attention at a hospital or doctor's office? Yes When did it last happen?36 yrs ago If all above answers are "NO", may proceed with cephalosporin use.   Percocet [oxycodone-acetaminophen] Nausea Only      Medication List       Accurate as of February 07, 2020  2:31 PM. If you have any questions, ask your nurse or doctor.        STOP taking these medications   ondansetron 4 MG disintegrating tablet Commonly known as: Zofran ODT Stopped by: Volanda Napoleon, MD       Allergies:  Allergies  Allergen Reactions  . Ampicillin Other (See Comments)    REACTION: seizures Did it involve swelling of the face/tongue/throat, SOB, or low BP? No Did it involve sudden or severe rash/hives, skin peeling, or any reaction on the inside of your mouth or nose? No Did you  need to seek medical attention at a hospital or doctor's office? Yes When did it last happen?36 yrs ago If all above answers are "NO", may proceed with cephalosporin use.  Marland Kitchen Percocet [Oxycodone-Acetaminophen] Nausea Only    Past Medical History, Surgical history, Social history, and Family History were reviewed and updated.  Review of Systems: Review of Systems  Constitutional: Negative.   HENT: Negative.   Eyes: Negative.   Respiratory: Negative.   Cardiovascular: Negative.   Gastrointestinal: Negative.   Genitourinary: Negative.   Musculoskeletal: Negative.   Skin: Negative.   Neurological: Negative.   Endo/Heme/Allergies: Negative.   Psychiatric/Behavioral: Negative.      Physical Exam:  height is 5' 2.21" (1.58 m) and weight is 204 lb (92.5 kg). Her temporal temperature is 97.3 F (36.3 C) (abnormal). Her blood pressure is 127/86 and her pulse is 81. Her respiration is 18 and oxygen saturation is 100%.   Wt Readings from Last 3 Encounters:  02/07/20 204 lb (92.5 kg)  08/10/19 199 lb (90.3 kg)  02/05/19 188 lb (85.3 kg)   Physical Exam Vitals reviewed.  HENT:     Head: Normocephalic and atraumatic.  Eyes:     Pupils: Pupils are equal, round, and reactive to light.  Cardiovascular:     Rate and Rhythm: Normal rate and regular  rhythm.     Heart sounds: Normal heart sounds.  Pulmonary:     Effort: Pulmonary effort is normal.     Breath sounds: Normal breath sounds.  Abdominal:     General: Bowel sounds are normal.     Palpations: Abdomen is soft.  Musculoskeletal:        General: No tenderness or deformity. Normal range of motion.     Cervical back: Normal range of motion.  Lymphadenopathy:     Cervical: No cervical adenopathy.  Skin:    General: Skin is warm and dry.     Findings: No erythema or rash.  Neurological:     Mental Status: She is alert and oriented to person, place, and time.  Psychiatric:        Behavior: Behavior normal.        Thought  Content: Thought content normal.        Judgment: Judgment normal.      Lab Results  Component Value Date   WBC 13.6 (H) 02/07/2020   HGB 13.5 02/07/2020   HCT 42.4 02/07/2020   MCV 92.0 02/07/2020   PLT 460 (H) 02/07/2020   No results found for: FERRITIN, IRON, TIBC, UIBC, IRONPCTSAT Lab Results  Component Value Date   RBC 4.61 02/07/2020   No results found for: KPAFRELGTCHN, LAMBDASER, KAPLAMBRATIO No results found for: IGGSERUM, IGA, IGMSERUM No results found for: [place] Pink, SPEI   Chemistry      Component Value Date/Time   NA 141 02/07/2020 1320   NA 141 09/05/2018 1526   K 4.7 02/07/2020 1320   CL 106 02/07/2020 1320   CO2 29 02/07/2020 1320   BUN 17 02/07/2020 1320   BUN 13 09/05/2018 1526   CREATININE 0.86 02/07/2020 1320      Component Value Date/Time   CALCIUM 10.0 02/07/2020 1320   ALKPHOS 57 02/07/2020 1320   AST 15 02/07/2020 1320   ALT 10 02/07/2020 1320   BILITOT 0.2 (L) 02/07/2020 1320       Impression and Plan: Melissa Harding is a very pleasant 59 yo African American female with stage A CLL.  I do not think this had anything to do with her having the coronavirus.  She really is very early stage.  Her immune status really is not compromised by the CLL.  We will plan to get her back in 6 months.  I think this would be reasonable.  Almond thankful that she was able to get through the coronavirus.  Her faith got her through all this.  Her faith is incredibly strong.    Volanda Napoleon, MD 3/25/20212:31 PM

## 2020-02-08 LAB — LACTATE DEHYDROGENASE: LDH: 223 U/L — ABNORMAL HIGH (ref 98–192)

## 2020-02-14 ENCOUNTER — Encounter: Payer: Self-pay | Admitting: Hematology & Oncology

## 2020-02-17 ENCOUNTER — Ambulatory Visit (HOSPITAL_COMMUNITY)
Admission: EM | Admit: 2020-02-17 | Discharge: 2020-02-17 | Disposition: A | Discharge status: Home / Self Care | Payer: 59 | Attending: Emergency Medicine | Admitting: Emergency Medicine

## 2020-02-17 ENCOUNTER — Other Ambulatory Visit: Payer: Self-pay

## 2020-02-17 ENCOUNTER — Encounter (HOSPITAL_COMMUNITY): Payer: Self-pay

## 2020-02-17 DIAGNOSIS — B359 Dermatophytosis, unspecified: Secondary | ICD-10-CM

## 2020-02-17 MED ORDER — CLOTRIMAZOLE 1 % EX CREA: TOPICAL_CREAM | CUTANEOUS | 0 refills | Status: DC

## 2020-02-17 NOTE — ED Triage Notes (Signed)
C/o rash in her left groin. States it has been there for two weeks, but gotten unbearable the last two days. Denies pain, but endorses itching.

## 2020-02-17 NOTE — Discharge Instructions (Signed)
Stop the creams/ointments you had been using.  Use of the prescribed cream twice a day.  Keep the area clean and dry.  Should see improvement in the next 2 weeks.  If symptoms worsen or do not improve in the next 2 weeks to return to be seen or to follow up with your PCP.

## 2020-02-17 NOTE — ED Provider Notes (Signed)
[facility]    CSN: [id number] Arrival date & time: 02/17/20  1634      History   Chief Complaint Chief Complaint  Patient presents with  . Rash    HPI Melissa Harding is a 59 y.o. female.   Melissa Harding presents with complaints of rash to left groin and to right panus skin fold. Noted it approximately 2 weeks ago but has been worsening. Very itchy. No pain. No known exposure to products or allergens. Has been applying cortisone gel as well as triamcinolone to the area, which she feels has made it worse. Denies any previous similar. She is not diabetic.    ROS per HPI, negative if not otherwise mentioned.      History reviewed. No pertinent past medical history.  Patient Active Problem List   Diagnosis Date Noted  . Lymphocytosis 02/05/2019  . MENOPAUSAL SYNDROME 03/05/2009  . MUSCLE SPASM 03/05/2009  . DYSPHAGIA UNSPECIFIED 03/05/2009  . GERD 10/14/2004    Past Surgical History:  Procedure Laterality Date  . APPENDECTOMY    . CESAREAN SECTION     2 previous  . ECTOPIC PREGNANCY SURGERY     x2  . LAPAROSCOPIC TUBAL LIGATION    . TUBAL LIGATION      OB History    Gravida  6   Para      Term      Preterm      AB  4   Living  2     SAB      TAB  2   Ectopic  2   Multiple      Live Births  2            Home Medications    Prior to Admission medications   Medication Sig Start Date End Date Taking? Authorizing Provider  clotrimazole (LOTRIMIN) 1 % cream Apply to affected area 2 times daily 02/17/20   Zigmund Gottron, NP    Family History Family History  Problem Relation Age of Onset  . Hypertension Mother   . Hypertension Father   . Cancer Paternal Grandmother   . Heart attack Paternal Grandfather     Social History Social History   Tobacco Use  . Smoking status: Former Research scientist (life sciences)  . Smokeless tobacco: Never Used  Substance Use Topics  . Alcohol use: Yes    Comment: rarely a glass of wine  . Drug use: No      Allergies   Ampicillin and Percocet [oxycodone-acetaminophen]   Review of Systems Review of Systems   Physical Exam Triage Vital Signs ED Triage Vitals  Enc Vitals Group     BP 02/17/20 1700 115/75     Pulse Rate 02/17/20 1700 96     Resp 02/17/20 1700 17     Temp --      Temp src --      SpO2 02/17/20 1700 100 %     Weight --      Height --      Head Circumference --      Peak Flow --      Pain Score 02/17/20 1657 0     Pain Loc --      Pain Edu? --      Excl. in [place]? --    No data found.  Updated Vital Signs BP 115/75 (BP Location: Right Arm)   Pulse 96   Resp 17   SpO2 100%   Visual Acuity Right Eye Distance:  Left Eye Distance:   Bilateral Distance:    Right Eye Near:   Left Eye Near:    Bilateral Near:     Physical Exam Constitutional:      General: She is not in acute distress.    Appearance: She is well-developed.  Cardiovascular:     Rate and Rhythm: Normal rate.  Pulmonary:     Effort: Pulmonary effort is normal.  Skin:    General: Skin is warm and dry.          Comments: Left groin with red rash, some sparing deep within the skin fold, redness which is scaly and slightly raised predominately to rash edges which are well defined; rash to groin skin fold extending to left panus skin fold  Neurological:     Mental Status: She is alert and oriented to person, place, and time.      UC Treatments / Results  Labs (all labs ordered are listed, but only abnormal results are displayed) Labs Reviewed - No data to display  EKG   Radiology No results found.  Procedures Procedures (including critical care time)  Medications Ordered in UC Medications - No data to display  Initial Impression / Assessment and Plan / UC Course  I have reviewed the triage vital signs and the nursing notes.  Pertinent labs & imaging results that were available during my care of the patient were reviewed by me and considered in my medical decision making  (see chart for details).     Consistent with a fungal rash to left groin . Educated to stop steroid creams/ gels and start clotrimazole. Keep skin clean and dry. If symptoms worsen or do not improve in the next 2 weeks to return to be seen or to follow up with PCP.  Patient verbalized understanding and agreeable to plan.   Final Clinical Impressions(s) / UC Diagnoses   Final diagnoses:  Tinea     Discharge Instructions     Stop the creams/ointments you had been using.  Use of the prescribed cream twice a day.  Keep the area clean and dry.  Should see improvement in the next 2 weeks.  If symptoms worsen or do not improve in the next 2 weeks to return to be seen or to follow up with your PCP.     ED Prescriptions    Medication Sig Dispense Auth. Provider   clotrimazole (LOTRIMIN) 1 % cream Apply to affected area 2 times daily 15 g Augusto Gamble B, NP     PDMP not reviewed this encounter.   Zigmund Gottron, NP 02/18/20 1145

## 2020-02-22 ENCOUNTER — Ambulatory Visit: Payer: 59 | Attending: Internal Medicine

## 2020-02-22 DIAGNOSIS — Z23 Encounter for immunization: Secondary | ICD-10-CM

## 2020-02-22 NOTE — Progress Notes (Signed)
   Covid-19 Vaccination Clinic  Name:  Melissa Harding    MRN: [id number] DOB: 12/13/1960  02/22/2020  [name] was observed post Covid-19 immunization for 15 minutes without incident. She was provided with Vaccine Information Sheet and instruction to access the V-Safe system.   [name] was instructed to call 911 with any severe reactions post vaccine: Marland Kitchen Difficulty breathing  . Swelling of face and throat  . A fast heartbeat  . A bad rash all over body  . Dizziness and weakness   Immunizations Administered    Name Date Dose VIS Date Route   Pfizer COVID-19 Vaccine 02/22/2020  1:34 PM 0.3 mL 10/26/2019 Intramuscular   Manufacturer: [place]   Lot: [id number]   [place]: [id number]

## 2020-03-17 ENCOUNTER — Ambulatory Visit: Payer: 59 | Attending: Internal Medicine

## 2020-03-17 DIAGNOSIS — Z23 Encounter for immunization: Secondary | ICD-10-CM

## 2020-03-17 NOTE — Progress Notes (Signed)
   Covid-19 Vaccination Clinic  Name:  Melissa Harding    MRN: [id number] DOB: May 25, 1961  03/17/2020  [name] was observed post Covid-19 immunization for 15 minutes without incident. She was provided with Vaccine Information Sheet and instruction to access the V-Safe system.   [name] was instructed to call 911 with any severe reactions post vaccine: Marland Kitchen Difficulty breathing  . Swelling of face and throat  . A fast heartbeat  . A bad rash all over body  . Dizziness and weakness   Immunizations Administered    Name Date Dose VIS Date Route   Pfizer COVID-19 Vaccine 03/17/2020  1:32 PM 0.3 mL 01/09/2019 Intramuscular   Manufacturer: [place]   Lot: [id number]   [place]: [id number]

## 2020-03-20 ENCOUNTER — Ambulatory Visit: Payer: 59 | Admitting: Hematology & Oncology

## 2020-03-20 ENCOUNTER — Other Ambulatory Visit: Payer: 59

## 2020-08-07 ENCOUNTER — Other Ambulatory Visit: Payer: Self-pay

## 2020-08-07 ENCOUNTER — Inpatient Hospital Stay: Payer: 59 | Attending: Hematology & Oncology

## 2020-08-07 ENCOUNTER — Encounter: Payer: Self-pay | Admitting: Hematology & Oncology

## 2020-08-07 ENCOUNTER — Inpatient Hospital Stay (HOSPITAL_BASED_OUTPATIENT_CLINIC_OR_DEPARTMENT_OTHER): Payer: 59 | Admitting: Hematology & Oncology

## 2020-08-07 ENCOUNTER — Ambulatory Visit: Payer: 59 | Admitting: Surgery

## 2020-08-07 ENCOUNTER — Telehealth: Payer: Self-pay | Admitting: Hematology & Oncology

## 2020-08-07 VITALS — BP 152/75 | HR 73 | Temp 97.7°F | Resp 18 | Ht 62.0 in | Wt 208.0 lb

## 2020-08-07 DIAGNOSIS — D72820 Lymphocytosis (symptomatic): Secondary | ICD-10-CM

## 2020-08-07 DIAGNOSIS — Z7189 Other specified counseling: Secondary | ICD-10-CM

## 2020-08-07 DIAGNOSIS — Z8616 Personal history of COVID-19: Secondary | ICD-10-CM | POA: Insufficient documentation

## 2020-08-07 DIAGNOSIS — Z0000 Encounter for general adult medical examination without abnormal findings: Secondary | ICD-10-CM | POA: Insufficient documentation

## 2020-08-07 DIAGNOSIS — C9110 Chronic lymphocytic leukemia of B-cell type not having achieved remission: Secondary | ICD-10-CM

## 2020-08-07 HISTORY — DX: Chronic lymphocytic leukemia of B-cell type not having achieved remission: C91.10

## 2020-08-07 HISTORY — DX: Other specified counseling: Z71.89

## 2020-08-07 LAB — CBC WITH DIFFERENTIAL (CANCER CENTER ONLY)
Abs Immature Granulocytes: 0.02 10*3/uL (ref 0.00–0.07)
Basophils Absolute: 0.1 10*3/uL (ref 0.0–0.1)
Basophils Relative: 1 %
Eosinophils Absolute: 0.2 10*3/uL (ref 0.0–0.5)
Eosinophils Relative: 2 %
HCT: 41.5 % (ref 36.0–46.0)
Hemoglobin: 13.3 g/dL (ref 12.0–15.0)
Immature Granulocytes: 0 %
Lymphocytes Relative: 65 %
Lymphs Abs: 8 10*3/uL — ABNORMAL HIGH (ref 0.7–4.0)
MCH: 29.5 pg (ref 26.0–34.0)
MCHC: 32 g/dL (ref 30.0–36.0)
MCV: 92 fL (ref 80.0–100.0)
Monocytes Absolute: 0.4 10*3/uL (ref 0.1–1.0)
Monocytes Relative: 3 %
Neutro Abs: 3.6 10*3/uL (ref 1.7–7.7)
Neutrophils Relative %: 29 %
Platelet Count: 410 10*3/uL — ABNORMAL HIGH (ref 150–400)
RBC: 4.51 MIL/uL (ref 3.87–5.11)
RDW: 13.2 % (ref 11.5–15.5)
WBC Count: 12.3 10*3/uL — ABNORMAL HIGH (ref 4.0–10.5)
nRBC: 0 % (ref 0.0–0.2)

## 2020-08-07 LAB — CMP (CANCER CENTER ONLY)
ALT: 9 U/L (ref 0–44)
AST: 15 U/L (ref 15–41)
Albumin: 4.5 g/dL (ref 3.5–5.0)
Alkaline Phosphatase: 54 U/L (ref 38–126)
Anion gap: 6 (ref 5–15)
BUN: 17 mg/dL (ref 6–20)
CO2: 30 mmol/L (ref 22–32)
Calcium: 10.1 mg/dL (ref 8.9–10.3)
Chloride: 105 mmol/L (ref 98–111)
Creatinine: 0.8 mg/dL (ref 0.44–1.00)
GFR, Est AFR Am: >60 mL/min (ref >60)
GFR, Estimated: >60 mL/min (ref >60)
Glucose, Bld: 95 mg/dL (ref 70–99)
Potassium: 4.4 mmol/L (ref 3.5–5.1)
Sodium: 141 mmol/L (ref 135–145)
Total Bilirubin: 0.3 mg/dL (ref 0.3–1.2)
Total Protein: 7.1 g/dL (ref 6.5–8.1)

## 2020-08-07 LAB — SAVE SMEAR(SSMR), FOR PROVIDER SLIDE REVIEW

## 2020-08-07 NOTE — Progress Notes (Signed)
Hematology and Oncology Follow Up Visit  Melissa Harding [id number] 1961-03-06 59 y.o. 08/07/2020   Principle Diagnosis:  CLL - Stage A  Current Therapy:   Observation   Interim History:  Melissa Harding is here today for follow-up.  So far, here things going pretty well with her.  Unfortunately, she did have the coronavirus back in December.  She was hospitalized for about 10 days.  She was not intubated.  She was treated with the monoclonal antibody.  She was out of work for about a month.  She is doing well right now.  Hopefully, she will be able to take a vacation this year.  She is not noted any problems with swollen lymph nodes.  She has had no rashes.  She has had no change in bowel or bladder habits.  She has had no headache.  There is been no cough.  Overall, her performance status is ECOG 1.  Medications:  Allergies as of 08/07/2020      Reactions   Ampicillin Other (See Comments)   REACTION: seizures Did it involve swelling of the face/tongue/throat, SOB, or low BP? No Did it involve sudden or severe rash/hives, skin peeling, or any reaction on the inside of your mouth or nose? No Did you need to seek medical attention at a hospital or doctor's office? Yes When did it last happen?36 yrs ago If all above answers are "NO", may proceed with cephalosporin use.   Percocet [oxycodone-acetaminophen] Nausea Only      Medication List       Accurate as of August 07, 2020  1:36 PM. If you have any questions, ask your nurse or doctor.        STOP taking these medications   clotrimazole 1 % cream Commonly known as: LOTRIMIN Stopped by: Volanda Napoleon, MD       Allergies:  Allergies  Allergen Reactions  . Ampicillin Other (See Comments)    REACTION: seizures Did it involve swelling of the face/tongue/throat, SOB, or low BP? No Did it involve sudden or severe rash/hives, skin peeling, or any reaction on the inside of your mouth or nose? No Did you need to seek  medical attention at a hospital or doctor's office? Yes When did it last happen?36 yrs ago If all above answers are "NO", may proceed with cephalosporin use.  Marland Kitchen Percocet [Oxycodone-Acetaminophen] Nausea Only    Past Medical History, Surgical history, Social history, and Family History were reviewed and updated.  Review of Systems: Review of Systems  Constitutional: Negative.   HENT: Negative.   Eyes: Negative.   Respiratory: Negative.   Cardiovascular: Negative.   Gastrointestinal: Negative.   Genitourinary: Negative.   Musculoskeletal: Negative.   Skin: Negative.   Neurological: Negative.   Endo/Heme/Allergies: Negative.   Psychiatric/Behavioral: Negative.      Physical Exam:  height is 5\' 2"  (1.575 m) and weight is 208 lb (94.3 kg). Her oral temperature is 97.7 F (36.5 C). Her blood pressure is 152/75 (abnormal) and her pulse is 73. Her respiration is 18 and oxygen saturation is 100%.   Wt Readings from Last 3 Encounters:  08/07/20 208 lb (94.3 kg)  02/07/20 204 lb (92.5 kg)  08/10/19 199 lb (90.3 kg)   Physical Exam Vitals reviewed.  HENT:     Head: Normocephalic and atraumatic.  Eyes:     Pupils: Pupils are equal, round, and reactive to light.  Cardiovascular:     Rate and Rhythm: Normal rate and regular rhythm.  Heart sounds: Normal heart sounds.  Pulmonary:     Effort: Pulmonary effort is normal.     Breath sounds: Normal breath sounds.  Abdominal:     General: Bowel sounds are normal.     Palpations: Abdomen is soft.  Musculoskeletal:        General: No tenderness or deformity. Normal range of motion.     Cervical back: Normal range of motion.  Lymphadenopathy:     Cervical: No cervical adenopathy.  Skin:    General: Skin is warm and dry.     Findings: No erythema or rash.  Neurological:     Mental Status: She is alert and oriented to person, place, and time.  Psychiatric:        Behavior: Behavior normal.        Thought Content: Thought  content normal.        Judgment: Judgment normal.      Lab Results  Component Value Date   WBC 12.3 (H) 08/07/2020   HGB 13.3 08/07/2020   HCT 41.5 08/07/2020   MCV 92.0 08/07/2020   PLT 410 (H) 08/07/2020   No results found for: FERRITIN, IRON, TIBC, UIBC, IRONPCTSAT Lab Results  Component Value Date   RBC 4.51 08/07/2020   No results found for: KPAFRELGTCHN, LAMBDASER, KAPLAMBRATIO No results found for: IGGSERUM, IGA, IGMSERUM No results found for: Odetta Pink, SPEI   Chemistry      Component Value Date/Time   NA 141 08/07/2020 1251   NA 141 09/05/2018 1526   K 4.4 08/07/2020 1251   CL 105 08/07/2020 1251   CO2 30 08/07/2020 1251   BUN 17 08/07/2020 1251   BUN 13 09/05/2018 1526   CREATININE 0.80 08/07/2020 1251      Component Value Date/Time   CALCIUM 10.1 08/07/2020 1251   ALKPHOS 54 08/07/2020 1251   AST 15 08/07/2020 1251   ALT 9 08/07/2020 1251   BILITOT 0.3 08/07/2020 1251       Impression and Plan: Melissa Harding is a very pleasant 59 yo African American female with stage A CLL.  I do not think this had anything to do with her having the coronavirus.  She really is very early stage.  Her immune status really is not compromised by the CLL.  We will plan to get her back in 6 months.  I think this would be reasonable.  Almond thankful that she was able to get through the coronavirus.  Her faith got her through all this.  Her faith is incredibly strong.    Volanda Napoleon, MD 9/23/20211:36 PM

## 2020-08-08 LAB — KAPPA/LAMBDA LIGHT CHAINS
Kappa free light chain: 18.3 mg/L (ref 3.3–19.4)
Kappa, lambda light chain ratio: 2.1 — ABNORMAL HIGH (ref 0.26–1.65)
Lambda free light chains: 8.7 mg/L (ref 5.7–26.3)

## 2020-08-08 LAB — PROTEIN ELECTROPHORESIS, SERUM, WITH REFLEX
A/G Ratio: 1.7 (ref 0.7–1.7)
Albumin ELP: 4.3 g/dL (ref 2.9–4.4)
Alpha-1-Globulin: 0.1 g/dL (ref 0.0–0.4)
Alpha-2-Globulin: 0.6 g/dL (ref 0.4–1.0)
Beta Globulin: 0.9 g/dL (ref 0.7–1.3)
Gamma Globulin: 0.9 g/dL (ref 0.4–1.8)
Globulin, Total: 2.5 g/dL (ref 2.2–3.9)
Total Protein ELP: 6.8 g/dL (ref 6.0–8.5)

## 2020-08-08 LAB — IGG, IGA, IGM
IgA: 58 mg/dL — ABNORMAL LOW (ref 87–352)
IgG (Immunoglobin G), Serum: 988 mg/dL (ref 586–1602)
IgM (Immunoglobulin M), Srm: 33 mg/dL (ref 26–217)

## 2020-08-11 ENCOUNTER — Encounter: Payer: Self-pay | Admitting: Gastroenterology

## 2020-08-13 ENCOUNTER — Ambulatory Visit: Payer: 59 | Admitting: Surgery

## 2020-08-14 ENCOUNTER — Telehealth: Payer: Self-pay

## 2020-08-14 NOTE — Telephone Encounter (Signed)
-----   Message from [place], DO sent at 08/07/2020  3:45 PM EDT ----- Can you please schedule this patient for colonoscopy with me?  I reviewed notes from Oncology, and think direct access is reasonable for routine CRC screening.  Thanks. ----- Message ----- From: Volanda Napoleon, MD Sent: 08/07/2020   2:57 PM EDT To: Lavena Bullion, DO  Godfather:  my patient has never had a colonoscopy.  She needs one for screening.  Thanks for your help!!!  Laurey Arrow

## 2020-08-14 NOTE — Telephone Encounter (Signed)
Called patient three times over the past week to schedule a colonoscopy for screening. Messages have been left on her voice mail to contact the office for an appointment. Letter will be mailed to patient as well.

## 2020-09-29 ENCOUNTER — Other Ambulatory Visit: Payer: Self-pay

## 2020-09-29 ENCOUNTER — Ambulatory Visit (AMBULATORY_SURGERY_CENTER): Payer: Self-pay | Admitting: *Deleted

## 2020-09-29 VITALS — Ht 62.0 in | Wt 209.0 lb

## 2020-09-29 DIAGNOSIS — Z1211 Encounter for screening for malignant neoplasm of colon: Secondary | ICD-10-CM

## 2020-09-29 MED ORDER — NA SULFATE-K SULFATE-MG SULF 17.5-3.13-1.6 GM/177ML PO SOLN: ORAL | 0 refills | Status: DC

## 2020-09-29 NOTE — Progress Notes (Signed)
Patient is here in-person for PV. Patient denies any allergies to eggs or soy. Patient denies any problems with anesthesia/sedation. Hard to wake up from anesthesia per pt. Patient denies any oxygen use at home. Patient denies taking any diet/weight loss medications or blood thinners. Patient is not being treated for MRSA or C-diff. Patient is aware of our care-partner policy and ACQPE-48 safety protocol. EMMI education assigned to the patient for the procedure, sent to [facility].   COVID-19 vaccines completed on 03/17/20, per patient.

## 2020-10-12 ENCOUNTER — Telehealth: Payer: Self-pay | Admitting: Nurse Practitioner

## 2020-10-12 NOTE — Telephone Encounter (Signed)
Please reschedule at the patient's convenience. Thanks.

## 2020-10-12 NOTE — Telephone Encounter (Signed)
Patient called the on-call service on Sunday, 10/12/2020.  She developed food poisoning on Thursday Thanksgiving holiday.  She described having nausea vomiting and a lot of loose nonbloody stool.  Her vomiting and frequent loose stools abated later on Friday.  Saturday she felt lethargic and a bit lightheaded.  Today she feels a bit dehydrated and continues to have some lethargy.  She is concerned about taking the bowel prep as she is scheduled for colonoscopy tomorrow with Dr. Tarri Glenn at 11am.   I advised the patient to hydrate and push fluids.  Her colonoscopy tomorrow will be canceled.  This was a screening elective colonoscopy.  I will have Dr. Tarri Glenn or her nurse contact the patient to reschedule her colonoscopy.

## 2020-10-13 ENCOUNTER — Encounter: Payer: 59 | Admitting: Gastroenterology

## 2020-10-15 NOTE — Telephone Encounter (Signed)
Pt rescheduled for 11/03/20@4pm .

## 2020-10-31 ENCOUNTER — Telehealth: Payer: Self-pay

## 2020-10-31 NOTE — Telephone Encounter (Signed)
Patient is scheduled to see Dr. Tarri Glenn on 11/03/20 at [facility] to see if she would be interested in moving up to the 0930 slot. Pts voicemail is full, and couldn't leave a VM. Will try to call back this afternoon.

## 2020-11-03 ENCOUNTER — Ambulatory Visit (AMBULATORY_SURGERY_CENTER): Payer: 59 | Admitting: Gastroenterology

## 2020-11-03 ENCOUNTER — Other Ambulatory Visit: Payer: Self-pay

## 2020-11-03 ENCOUNTER — Encounter: Payer: Self-pay | Admitting: Gastroenterology

## 2020-11-03 VITALS — BP 115/75 | HR 73 | Temp 98.9°F | Resp 37 | Ht 62.0 in | Wt 209.0 lb

## 2020-11-03 DIAGNOSIS — D124 Benign neoplasm of descending colon: Secondary | ICD-10-CM

## 2020-11-03 DIAGNOSIS — Z1211 Encounter for screening for malignant neoplasm of colon: Secondary | ICD-10-CM | POA: Diagnosis present

## 2020-11-03 DIAGNOSIS — D123 Benign neoplasm of transverse colon: Secondary | ICD-10-CM

## 2020-11-03 MED ORDER — SODIUM CHLORIDE 0.9 % IV SOLN: 500.0000 mL | Freq: Once | INTRAVENOUS | Status: DC

## 2020-11-03 NOTE — Progress Notes (Signed)
Called to room to assist during endoscopic procedure.  Patient ID and intended procedure confirmed with present staff. Received instructions for my participation in the procedure from the performing physician.  

## 2020-11-03 NOTE — Progress Notes (Signed)
Report to PACU, RN, vss, BBS= Clear.  

## 2020-11-03 NOTE — Patient Instructions (Signed)
Information on polyps, diverticulosis, and hemorrhoids given to you today.  Await pathology results.  Resume previous diet and medications.  YOU HAD AN ENDOSCOPIC PROCEDURE TODAY AT THE [facility]:   Refer to the procedure report that was given to you for any specific questions about what was found during the examination.  If the procedure report does not answer your questions, please call your gastroenterologist to clarify.  If you requested that your care partner not be given the details of your procedure findings, then the procedure report has been included in a sealed envelope for you to review at your convenience later.  YOU SHOULD EXPECT: Some feelings of bloating in the abdomen. Passage of more gas than usual.  Walking can help get rid of the air that was put into your GI tract during the procedure and reduce the bloating. If you had a lower endoscopy (such as a colonoscopy or flexible sigmoidoscopy) you may notice spotting of blood in your stool or on the toilet paper. If you underwent a bowel prep for your procedure, you may not have a normal bowel movement for a few days.  Please Note:  You might notice some irritation and congestion in your nose or some drainage.  This is from the oxygen used during your procedure.  There is no need for concern and it should clear up in a day or so.  SYMPTOMS TO REPORT IMMEDIATELY:   Following lower endoscopy (colonoscopy or flexible sigmoidoscopy):  Excessive amounts of blood in the stool  Significant tenderness or worsening of abdominal pains  Swelling of the abdomen that is new, acute  Fever of 100F or higher  For urgent or emergent issues, a gastroenterologist can be reached at any hour by calling [phone]. Do not use MyChart messaging for urgent concerns.    DIET:  We do recommend a small meal at first, but then you may proceed to your regular diet.  Drink plenty of fluids but you should avoid alcoholic beverages for 24  hours.  ACTIVITY:  You should plan to take it easy for the rest of today and you should NOT DRIVE or use heavy machinery until tomorrow (because of the sedation medicines used during the test).    FOLLOW UP: Our staff will call the number listed on your records 48-72 hours following your procedure to check on you and address any questions or concerns that you may have regarding the information given to you following your procedure. If we do not reach you, we will leave a message.  We will attempt to reach you two times.  During this call, we will ask if you have developed any symptoms of COVID 19. If you develop any symptoms (ie: fever, flu-like symptoms, shortness of breath, cough etc.) before then, please call [phone].  If you test positive for Covid 19 in the 2 weeks post procedure, please call and report this information to us.    If any biopsies were taken you will be contacted by phone or by letter within the next 1-3 weeks.  Please call us at [phone] if you have not heard about the biopsies in 3 weeks.    SIGNATURES/CONFIDENTIALITY: You and/or your care partner have signed paperwork which will be entered into your electronic medical record.  These signatures attest to the fact that that the information above on your After Visit Summary has been reviewed and is understood.  Full responsibility of the confidentiality of this discharge information lies with you and/or   your care-partner. 

## 2020-11-03 NOTE — Op Note (Addendum)
[place] Patient Name: Melissa Harding Procedure Date: 11/03/2020 3:35 PM MRN: [id number] Endoscopist: Thornton Park MD, MD Age: 59 Referring MD:  Date of Birth: Nov 07, 1961 Gender: Female Account #: [id number] Procedure:                Colonoscopy Indications:              Screening for colorectal malignant neoplasm, This                            is the patient's first colonoscopy                           No known family history of colon cancer or polyps Medicines:                Monitored Anesthesia Care Procedure:                Pre-Anesthesia Assessment:                           - Prior to the procedure, a History and Physical                            was performed, and patient medications and                            allergies were reviewed. The patient's tolerance of                            previous anesthesia was also reviewed. The risks                            and benefits of the procedure and the sedation                            options and risks were discussed with the patient.                            All questions were answered, and informed consent                            was obtained. Prior Anticoagulants: The patient has                            taken no previous anticoagulant or antiplatelet                            agents. ASA Grade Assessment: II - A patient with                            mild systemic disease. After reviewing the risks                            and benefits, the patient was deemed in  satisfactory condition to undergo the procedure.                           After obtaining informed consent, the colonoscope                            was passed under direct vision. Throughout the                            procedure, the patient's blood pressure, pulse, and                            oxygen saturations were monitored continuously. The                            Olympus CF-HQ190L  [id number]) Colonoscope was                            introduced through the anus and advanced to the 3                            cm into the ileum. The patient tolerated the                            procedure well. The quality of the bowel                            preparation was good. The terminal ileum, ileocecal                            valve, appendiceal orifice, and rectum were                            photographed. The colonoscopy was performed with                            moderate difficulty due to restricted mobility of                            the colon, significant looping and a tortuous                            colon. Successful completion of the procedure was                            aided by changing the patient's position and                            applying abdominal pressure. Scope In: 3:41:46 PM Scope Out: 3:58:26 PM Scope Withdrawal Time: 0 hours 11 minutes 26 seconds  Total Procedure Duration: 0 hours 16 minutes 40 seconds  Findings:                 The perianal and digital rectal examinations were  normal.                           Non-bleeding internal hemorrhoids were found.                           Many small and large-mouthed diverticula were found                            in the sigmoid colon and descending colon.                           A 1 mm polyp was found in the descending colon. The                            polyp was flat. The polyp was removed with a cold                            snare. Resection and retrieval were complete.                            Estimated blood loss was minimal.                           A 2 mm polyp was found in the proximal transverse                            colon. The polyp was sessile. The polyp was removed                            with a cold snare. Resection and retrieval were                            complete. Estimated blood loss was minimal.                            The colon is tortuous. There was some restricted                            mobility in the distal sigmoid colon in the area of                            the most extensive diverticulosis. The exam was                            otherwise without abnormality on direct and                            retroflexion views. Complications:            No immediate complications. Estimated blood loss:                            Minimal. Estimated Blood Loss:     Estimated  blood loss was minimal. Impression:               - Non-bleeding internal hemorrhoids.                           - Diverticulosis in the sigmoid colon and in the                            descending colon.                           - One 1 mm polyp in the descending colon, removed                            with a cold snare. Resected and retrieved.                           - One 2 mm polyp in the proximal transverse colon,                            removed with a cold snare. Resected and retrieved.                           - The examination was otherwise normal on direct                            and retroflexion views. Recommendation:           - Patient has a contact number available for                            emergencies. The signs and symptoms of potential                            delayed complications were discussed with the                            patient. Return to normal activities tomorrow.                            Written discharge instructions were provided to the                            patient.                           - Follow a high fiber diet. Drink at least 64                            ounces of water daily. Add a daily stool bulking                            agent such as psyllium (an exampled would be  Metamucil).                           - Continue present medications.                           - Await pathology results.                           -  Repeat colonoscopy date to be determined after                            pending pathology results are reviewed for                            surveillance.                           - Emerging evidence supports eating a diet of                            fruits, vegetables, grains, calcium, and yogurt                            while reducing red meat and alcohol may reduce the                            risk of colon cancer.                           - Thank you for allowing me to be involved in your                            colon cancer prevention. Thornton Park MD, MD 11/03/2020 4:04:00 PM This report has been signed electronically.

## 2020-11-03 NOTE — Progress Notes (Signed)
VS-CW  Pt's states no medical or surgical changes since previsit or office visit.  

## 2020-11-06 ENCOUNTER — Telehealth: Payer: Self-pay

## 2020-11-06 NOTE — Telephone Encounter (Signed)
First attempt follow up call to pt, lm on vm 

## 2020-11-13 ENCOUNTER — Encounter: Payer: Self-pay | Admitting: Gastroenterology

## 2021-01-12 ENCOUNTER — Ambulatory Visit (INDEPENDENT_AMBULATORY_CARE_PROVIDER_SITE_OTHER): Payer: 59

## 2021-01-12 ENCOUNTER — Encounter: Payer: Self-pay | Admitting: Physician Assistant

## 2021-01-12 ENCOUNTER — Other Ambulatory Visit: Payer: Self-pay

## 2021-01-12 ENCOUNTER — Ambulatory Visit (INDEPENDENT_AMBULATORY_CARE_PROVIDER_SITE_OTHER): Payer: 59 | Admitting: Physician Assistant

## 2021-01-12 VITALS — Ht 62.0 in | Wt 200.0 lb

## 2021-01-12 DIAGNOSIS — G8929 Other chronic pain: Secondary | ICD-10-CM

## 2021-01-12 DIAGNOSIS — M25561 Pain in right knee: Secondary | ICD-10-CM

## 2021-01-12 MED ORDER — DICLOFENAC SODIUM 75 MG PO TBEC: 75.0000 mg | DELAYED_RELEASE_TABLET | Freq: Two times a day (BID) | ORAL | 0 refills | Status: DC

## 2021-01-12 NOTE — Addendum Note (Signed)
Addended by: Lendon Collar on: 01/12/2021 11:27 AM   Modules accepted: Orders

## 2021-01-12 NOTE — Progress Notes (Signed)
Office Visit Note   Patient: Melissa Harding           Date of Birth: 09-Feb-1961           MRN: [id number] Visit Date: 01/12/2021              Requested by: Gildardo Pounds, NP [place],  [place] [address] PCP: Gildardo Pounds, NP   Assessment & Plan: Visit Diagnoses:  1. Chronic pain of right knee     Plan: Due to the fact patient continues to have significant pain in the mechanical symptoms and has a mechanical block and ankle placement knee beyond 90 degrees recommend MRI rule out meniscal tear.  We will have her stop her Advil.  Place her on diclofenac she is to take no other NSAIDs while on the diclofenac.  Should continue to wear the knee brace.  Follow-up with [place] after the MRI to go over results and discuss further treatment.  Questions encouraged and answered at length  Follow-Up Instructions: Return after MRi.   Orders:  Orders Placed This Encounter  Procedures  . XR Knee 1-2 Views Right   Meds ordered this encounter  Medications  . diclofenac (VOLTAREN) 75 MG EC tablet    Sig: Take 1 tablet (75 mg total) by mouth 2 (two) times daily.    Dispense:  60 tablet    Refill:  0      Procedures: No procedures performed   Clinical Data: No additional findings.   Subjective: Chief Complaint  Patient presents with  . Right Knee - Pain    HPI [name] comes in today with right knee pain for 8 months.  States pain began 8 months ago after exercising.  Noticed the knee started swelling with popping.  Has been giving way.  Initially she had painful popping however this is resolved.  She went to urgent care at [facility] had x-rays a cortisone injection which helped for about 5 months.  Now she is having increasing pain in the medial aspect of the knee and giving way.  She was also given a knee brace which she wore initially but is not wearing now.  She been taking Advil for the pain which is not helping.  She has had no prior knee injury or  surgery. Review of Systems Negative for fevers or chills.  Objective: Vital Signs: Ht 5\' 2"  (1.575 m)   Wt 200 lb (90.7 kg)   BMI 36.58 kg/m   Physical Exam Constitutional:      Appearance: She is not ill-appearing or diaphoretic.  Pulmonary:     Effort: Pulmonary effort is normal.  Neurological:     Mental Status: She is alert and oriented to person, place, and time.  Psychiatric:        Mood and Affect: Mood normal.     Ortho Exam Right knee full extension flexion to 90 degrees.  Unable to flex knee beyond 90 degrees.  No instability valgus varus stressing of either knee.  No effusion abnormal warmth erythema of either knee.  Slight edema of the right knee compared to left.  Right knee tenderness along medial joint line.  McMurray's negative on the left positive on the right.  Left knee good range of motion with significant patellofemoral crepitus.  Specialty Comments:  No specialty comments available.  Imaging: XR Knee 1-2 Views Right  Result Date: 01/12/2021 Right knee AP lateral views: No acute fractures.  Mild narrowing medial joint  line right knee.  Otherwise no periarticular osteophytes or bony abnormalities.  Knee is well located.    PMFS History: Patient Active Problem List   Diagnosis Date Noted  . CLL (chronic lymphocytic leukemia) ([place]) 08/07/2020  . Goals of care, counseling/discussion 08/07/2020  . Lymphocytosis 02/05/2019  . MENOPAUSAL SYNDROME 03/05/2009   Past Medical History:  Diagnosis Date  . CLL (chronic lymphocytic leukemia) ([place]) 08/07/2020  . Goals of care, counseling/discussion 08/07/2020    Family History  Problem Relation Age of Onset  . Hypertension Mother   . Hypertension Father   . Cancer Paternal Grandmother   . Stomach cancer Paternal Grandmother   . Heart attack Paternal Grandfather   . Colon cancer Neg Hx   . Colon polyps Neg Hx   . Esophageal cancer Neg Hx   . Rectal cancer Neg Hx     Past Surgical History:  Procedure  Laterality Date  . APPENDECTOMY    . CESAREAN SECTION     2 previous  . ECTOPIC PREGNANCY SURGERY     x2  . LAPAROSCOPIC TUBAL LIGATION     Social History   Occupational History  . Occupation: part time    Comment: self employed  Tobacco Use  . Smoking status: Former Research scientist (life sciences)  . Smokeless tobacco: Never Used  Vaping Use  . Vaping Use: Never used  Substance and Sexual Activity  . Alcohol use: Yes    Alcohol/week: 1.0 standard drink    Types: 1 Glasses of wine per week  . Drug use: No  . Sexual activity: Yes    Birth control/protection: Surgical

## 2021-01-18 ENCOUNTER — Ambulatory Visit
Admission: RE | Admit: 2021-01-18 | Discharge: 2021-01-18 | Disposition: A | Discharge status: Home / Self Care | Payer: 59 | Source: Ambulatory Visit | Attending: Physician Assistant | Admitting: Physician Assistant

## 2021-01-18 ENCOUNTER — Other Ambulatory Visit: Payer: Self-pay

## 2021-01-18 DIAGNOSIS — G8929 Other chronic pain: Secondary | ICD-10-CM

## 2021-01-20 ENCOUNTER — Other Ambulatory Visit: Payer: Self-pay

## 2021-01-20 ENCOUNTER — Ambulatory Visit: Payer: 59 | Attending: Nurse Practitioner | Admitting: Nurse Practitioner

## 2021-01-20 ENCOUNTER — Encounter: Payer: Self-pay | Admitting: Nurse Practitioner

## 2021-01-20 VITALS — Ht 62.0 in | Wt 212.0 lb

## 2021-01-20 DIAGNOSIS — Z1231 Encounter for screening mammogram for malignant neoplasm of breast: Secondary | ICD-10-CM

## 2021-01-20 DIAGNOSIS — E669 Obesity, unspecified: Secondary | ICD-10-CM

## 2021-01-20 MED ORDER — PHENTERMINE HCL 37.5 MG PO TABS: 37.5000 mg | ORAL_TABLET | Freq: Every day | ORAL | 1 refills | Status: DC

## 2021-01-20 NOTE — Progress Notes (Signed)
Virtual Visit via Telephone Note Due to national recommendations of social distancing due to [place] 19, telehealth visit is felt to be most appropriate for this patient at this time.  I discussed the limitations, risks, security and privacy concerns of performing an evaluation and management service by telephone and the availability of in person appointments. I also discussed with the patient that there may be a patient responsible charge related to this service. The patient expressed understanding and agreed to proceed.    I connected with Melissa Harding on 01/20/21  at  10:10 AM EST  EDT by telephone and verified that I am speaking with the correct person using two identifiers.   Consent I discussed the limitations, risks, security and privacy concerns of performing an evaluation and management service by telephone and the availability of in person appointments. I also discussed with the patient that there may be a patient responsible charge related to this service. The patient expressed understanding and agreed to proceed.   Location of Patient: Private Residence   Location of Provider: [place] [facility] [employer] Office    Persons participating in Telemedicine visit: Geryl Rankins FNP-BC [place]    History of Present Illness: Telemedicine visit for: Re establish care She has a past medical history of CLL (08/07/2020)  Patient has been counseled on age-appropriate routine health concerns for screening and prevention. These are reviewed and up-to-date. Referrals have been placed accordingly. Immunizations are up-to-date or declined.    Mammogram: 12-2019. Overdue. Referral placed today Colonoscopy: 10-2020 Follow up in 7 years PAP SMEAR: 2018. Schedule for next office visit  She sees Dr. Marin Olp for her chronic lymphocytic leukemia. Overall doing well. F/U 6 months.   She has chronic right knee pain with meniscal tear/injury.  She is being followed by  orthopedics for this.   Obesity Current weight 212 lbs. She has taken phentermine in the past and would like to restart at this time. Blood pressure has been well controlled. Denies chest pain, shortness of breath, palpitations, lightheadedness, dizziness, headaches or BLE edema.  BP Readings from Last 3 Encounters:  11/03/20 115/75  08/07/20 (!) 152/75  02/17/20 115/75   Wt Readings from Last 3 Encounters:  01/20/21 212 lb (96.2 kg)  01/12/21 200 lb (90.7 kg)  11/03/20 209 lb (94.8 kg)   Past Medical History:  Diagnosis Date  . CLL (chronic lymphocytic leukemia) ([facility]) 08/07/2020  . Goals of care, counseling/discussion 08/07/2020    Past Surgical History:  Procedure Laterality Date  . APPENDECTOMY    . CESAREAN SECTION     2 previous  . ECTOPIC PREGNANCY SURGERY     x2  . LAPAROSCOPIC TUBAL LIGATION      Family History  Problem Relation Age of Onset  . Hypertension Mother   . Hypertension Father   . Cancer Paternal Grandmother   . Stomach cancer Paternal Grandmother   . Heart attack Paternal Grandfather   . Colon cancer Neg Hx   . Colon polyps Neg Hx   . Esophageal cancer Neg Hx   . Rectal cancer Neg Hx     Social History   Socioeconomic History  . Marital status: Married    Spouse name: steven  . Number of children: 2  . Years of education: 28  . Highest education level: Not on file  Occupational History  . Occupation: part time    Comment: self employed  Tobacco Use  . Smoking status: Former Research scientist (life sciences)  . Smokeless  tobacco: Never Used  Vaping Use  . Vaping Use: Never used  Substance and Sexual Activity  . Alcohol use: Yes    Alcohol/week: 1.0 standard drink    Types: 1 Glasses of wine per week  . Drug use: No  . Sexual activity: Yes    Birth control/protection: Surgical  Other Topics Concern  . Not on file  Social History Narrative  . Not on file   Social Determinants of Health   Financial Resource Strain: Not on file  Food Insecurity: Not on file   Transportation Needs: Not on file  Physical Activity: Not on file  Stress: Not on file  Social Connections: Not on file     Observations/Objective: Awake, alert and oriented x 3   ROS  Assessment and Plan: Diagnoses and all orders for this visit:  Obesity, Class II, BMI 35-39.9  Breast cancer screening by mammogram -     MM 3D SCREEN BREAST BILATERAL; Future  Other orders -     phentermine (ADIPEX-P) 37.5 MG tablet; Take 1 tablet (37.5 mg total) by mouth daily before breakfast.     Follow Up Instructions Return in about 6 weeks (around 03/03/2021) for PAP SMEAR and weight check.     I discussed the assessment and treatment plan with the patient. The patient was provided an opportunity to ask questions and all were answered. The patient agreed with the plan and demonstrated an understanding of the instructions.   The patient was advised to call back or seek an in-person evaluation if the symptoms worsen or if the condition fails to improve as anticipated.  I provided 13 minutes of non-face-to-face time during this encounter including median intraservice time, reviewing previous notes, labs, imaging, medications and explaining diagnosis and management.  Gildardo Pounds, FNP-BC

## 2021-01-27 ENCOUNTER — Other Ambulatory Visit: Payer: Self-pay

## 2021-01-27 ENCOUNTER — Encounter: Payer: Self-pay | Admitting: Physician Assistant

## 2021-01-27 ENCOUNTER — Ambulatory Visit (INDEPENDENT_AMBULATORY_CARE_PROVIDER_SITE_OTHER): Payer: 59 | Admitting: Physician Assistant

## 2021-01-27 DIAGNOSIS — M23331 Other meniscus derangements, other medial meniscus, right knee: Secondary | ICD-10-CM

## 2021-01-27 DIAGNOSIS — S83241S Other tear of medial meniscus, current injury, right knee, sequela: Secondary | ICD-10-CM

## 2021-01-27 NOTE — Progress Notes (Signed)
HPI: [name] returns today to go over her MRI of her right knee.  She continues to have giving way symptoms of the right knee.  She is also having significant pain in the knee.  She has limited flexion of the knee beyond 90 degrees.  Her pain began some 9 months ago after exercising.  Prior to that she is having no mechanical symptoms of the knee.  She was seen in urgent care and given a cortisone injection which helped for about 5 months.  She then was seen in our office as and was worked up. MRI images of the right knee were reviewed with the patient today.  MRI is dated 01/18/2021, this shows degeneration of the body and posterior horn of the medial meniscus with peripheral meniscal extrusion.  There is also an oblique tear of the posterior horn of the medial meniscus extending to the free edge.  Partial thickness cartilage loss involving the medial compartment.  Otherwise no significant findings.    Physical exam: Right knee full extension and flexion to 90 degrees.  No abnormal warmth erythema.  Impression: Right knee medial meniscal tears.   Plan: Given patient's continued mechanical symptoms of the knee the fact that she cannot flex knee beyond 90 degrees along with the MRI showing medial meniscal tears.  Recommend right knee arthroscopy with partial medial meniscectomy.  Discussed risk benefits with the patient.  Questions were encouraged and answered at length.  Patient wishes to proceed with a right knee arthroscopy with partial medial meniscectomy.  We will work on scheduling this.  She is given Samella Parr card who schedules surgery for [place].  Postoperative protocol discussed with patient.  We will see her back 1 week postop.

## 2021-01-29 ENCOUNTER — Ambulatory Visit: Payer: 59 | Admitting: Nurse Practitioner

## 2021-02-04 ENCOUNTER — Inpatient Hospital Stay (HOSPITAL_BASED_OUTPATIENT_CLINIC_OR_DEPARTMENT_OTHER): Payer: 59 | Admitting: Hematology & Oncology

## 2021-02-04 ENCOUNTER — Inpatient Hospital Stay: Payer: 59 | Attending: Hematology & Oncology

## 2021-02-04 ENCOUNTER — Other Ambulatory Visit: Payer: Self-pay

## 2021-02-04 ENCOUNTER — Encounter: Payer: Self-pay | Admitting: Hematology & Oncology

## 2021-02-04 VITALS — BP 128/81 | HR 81 | Temp 98.2°F | Resp 16 | Wt 210.0 lb

## 2021-02-04 DIAGNOSIS — C9110 Chronic lymphocytic leukemia of B-cell type not having achieved remission: Secondary | ICD-10-CM

## 2021-02-04 LAB — CBC WITH DIFFERENTIAL (CANCER CENTER ONLY)
Abs Immature Granulocytes: 0.04 10*3/uL (ref 0.00–0.07)
Basophils Absolute: 0.1 10*3/uL (ref 0.0–0.1)
Basophils Relative: 1 %
Eosinophils Absolute: 0.4 10*3/uL (ref 0.0–0.5)
Eosinophils Relative: 2 %
HCT: 42.3 % (ref 36.0–46.0)
Hemoglobin: 13.6 g/dL (ref 12.0–15.0)
Immature Granulocytes: 0 %
Lymphocytes Relative: 53 %
Lymphs Abs: 8.6 10*3/uL — ABNORMAL HIGH (ref 0.7–4.0)
MCH: 29.4 pg (ref 26.0–34.0)
MCHC: 32.2 g/dL (ref 30.0–36.0)
MCV: 91.6 fL (ref 80.0–100.0)
Monocytes Absolute: 0.6 10*3/uL (ref 0.1–1.0)
Monocytes Relative: 4 %
Neutro Abs: 6.5 10*3/uL (ref 1.7–7.7)
Neutrophils Relative %: 40 %
Platelet Count: 461 10*3/uL — ABNORMAL HIGH (ref 150–400)
RBC: 4.62 MIL/uL (ref 3.87–5.11)
RDW: 13.3 % (ref 11.5–15.5)
WBC Count: 16.2 10*3/uL — ABNORMAL HIGH (ref 4.0–10.5)
nRBC: 0 % (ref 0.0–0.2)

## 2021-02-04 LAB — CMP (CANCER CENTER ONLY)
ALT: 16 U/L (ref 0–44)
AST: 18 U/L (ref 15–41)
Albumin: 4.6 g/dL (ref 3.5–5.0)
Alkaline Phosphatase: 60 U/L (ref 38–126)
Anion gap: 6 (ref 5–15)
BUN: 19 mg/dL (ref 6–20)
CO2: 30 mmol/L (ref 22–32)
Calcium: 10.2 mg/dL (ref 8.9–10.3)
Chloride: 105 mmol/L (ref 98–111)
Creatinine: 0.87 mg/dL (ref 0.44–1.00)
GFR, Estimated: >60 mL/min (ref >60)
Glucose, Bld: 104 mg/dL — ABNORMAL HIGH (ref 70–99)
Potassium: 4.5 mmol/L (ref 3.5–5.1)
Sodium: 141 mmol/L (ref 135–145)
Total Bilirubin: 0.3 mg/dL (ref 0.3–1.2)
Total Protein: 7.1 g/dL (ref 6.5–8.1)

## 2021-02-04 LAB — SAVE SMEAR(SSMR), FOR PROVIDER SLIDE REVIEW

## 2021-02-04 LAB — LACTATE DEHYDROGENASE: LDH: 162 U/L (ref 98–192)

## 2021-02-04 NOTE — Progress Notes (Signed)
Hematology and Oncology Follow Up Visit  Melissa Harding [id number] 1961/03/18 60 y.o. 02/04/2021   Principle Diagnosis:  CLL - Stage A  Current Therapy:   Observation   Interim History:  [name] is here today for follow-up.  She is doing pretty well right now.  She is going to have surgery on her right knee in April.  She has a meniscus tear.  It sounds like this will be arthroscopic surgery.  The big news that she and her husband are going on a cruise in June.  They will be leaving out of [place].  They will be going on there anniversary.  I am so happy for her.  She has never been on a cruise before.  I am sure she will enjoy this.  I have otherwise she is doing quite nicely.  She had no problems over the holiday season.  There is been no problems with swollen lymph nodes.  She has had no cough or shortness of breath.  She has had no nausea or vomiting.  She has had no change in bowel or bladder habits.  There has been no rashes.  She has had no bleeding.  Overall, I would say performance status is ECOG 1.    Medications:  Allergies as of 02/04/2021      Reactions   Ampicillin Other (See Comments)   REACTION: seizures Did it involve swelling of the face/tongue/throat, SOB, or low BP? No Did it involve sudden or severe rash/hives, skin peeling, or any reaction on the inside of your mouth or nose? No Did you need to seek medical attention at a hospital or doctor's office? Yes When did it last happen?36 yrs ago If all above answers are "NO", may proceed with cephalosporin use.   Percocet [oxycodone-acetaminophen] Nausea Only      Medication List       Accurate as of February 04, 2021  2:24 PM. If you have any questions, ask your nurse or doctor.        diclofenac 75 MG EC tablet Commonly known as: VOLTAREN Take 1 tablet (75 mg total) by mouth 2 (two) times daily.   phentermine 37.5 MG tablet Commonly known as: ADIPEX-P Take 1 tablet (37.5 mg total) by mouth daily  before breakfast.       Allergies:  Allergies  Allergen Reactions  . Ampicillin Other (See Comments)    REACTION: seizures Did it involve swelling of the face/tongue/throat, SOB, or low BP? No Did it involve sudden or severe rash/hives, skin peeling, or any reaction on the inside of your mouth or nose? No Did you need to seek medical attention at a hospital or doctor's office? Yes When did it last happen?36 yrs ago If all above answers are "NO", may proceed with cephalosporin use.  Marland Kitchen Percocet [Oxycodone-Acetaminophen] Nausea Only    Past Medical History, Surgical history, Social history, and Family History were reviewed and updated.  Review of Systems: Review of Systems  Constitutional: Negative.   HENT: Negative.   Eyes: Negative.   Respiratory: Negative.   Cardiovascular: Negative.   Gastrointestinal: Negative.   Genitourinary: Negative.   Musculoskeletal: Negative.   Skin: Negative.   Neurological: Negative.   Endo/Heme/Allergies: Negative.   Psychiatric/Behavioral: Negative.      Physical Exam:  weight is 210 lb (95.3 kg). Her oral temperature is 98.2 F (36.8 C). Her blood pressure is 128/81 and her pulse is 81. Her respiration is 16 and oxygen saturation is 100%.   Wt  Readings from Last 3 Encounters:  02/04/21 210 lb (95.3 kg)  01/20/21 212 lb (96.2 kg)  01/12/21 200 lb (90.7 kg)   Physical Exam Vitals reviewed.  HENT:     Head: Normocephalic and atraumatic.  Eyes:     Pupils: Pupils are equal, round, and reactive to light.  Cardiovascular:     Rate and Rhythm: Normal rate and regular rhythm.     Heart sounds: Normal heart sounds.  Pulmonary:     Effort: Pulmonary effort is normal.     Breath sounds: Normal breath sounds.  Abdominal:     General: Bowel sounds are normal.     Palpations: Abdomen is soft.  Musculoskeletal:        General: No tenderness or deformity. Normal range of motion.     Cervical back: Normal range of motion.   Lymphadenopathy:     Cervical: No cervical adenopathy.  Skin:    General: Skin is warm and dry.     Findings: No erythema or rash.  Neurological:     Mental Status: She is alert and oriented to person, place, and time.  Psychiatric:        Behavior: Behavior normal.        Thought Content: Thought content normal.        Judgment: Judgment normal.      Lab Results  Component Value Date   WBC 16.2 (H) 02/04/2021   HGB 13.6 02/04/2021   HCT 42.3 02/04/2021   MCV 91.6 02/04/2021   PLT 461 (H) 02/04/2021   No results found for: FERRITIN, IRON, TIBC, UIBC, IRONPCTSAT Lab Results  Component Value Date   RBC 4.62 02/04/2021   Lab Results  Component Value Date   KPAFRELGTCHN 18.3 08/07/2020   LAMBDASER 8.7 08/07/2020   KAPLAMBRATIO 2.10 (H) 08/07/2020   Lab Results  Component Value Date   IGGSERUM 988 08/07/2020   IGA 58 (L) 08/07/2020   IGMSERUM 33 08/07/2020   Lab Results  Component Value Date   TOTALPROTELP 6.8 08/07/2020   ALBUMINELP 4.3 08/07/2020   A1GS 0.1 08/07/2020   A2GS 0.6 08/07/2020   BETS 0.9 08/07/2020   GAMS 0.9 08/07/2020   MSPIKE Not Observed 08/07/2020     Chemistry      Component Value Date/Time   NA 141 02/04/2021 1303   NA 141 09/05/2018 1526   K 4.5 02/04/2021 1303   CL 105 02/04/2021 1303   CO2 30 02/04/2021 1303   BUN 19 02/04/2021 1303   BUN 13 09/05/2018 1526   CREATININE 0.87 02/04/2021 1303      Component Value Date/Time   CALCIUM 10.2 02/04/2021 1303   ALKPHOS 60 02/04/2021 1303   AST 18 02/04/2021 1303   ALT 16 02/04/2021 1303   BILITOT 0.3 02/04/2021 1303       Impression and Plan: [name] is a very pleasant 60 yo African American female with stage A CLL.  She is holding everything stable right now.  Her white cell count is up a little bit more but her present lymphocytes is little bit lower.  I have no problems with her having the knee surgery.  It will be arthroscopic.  I do not see a problem at her healing or any  increased risk of infection.  I would still like to get her back in 6 months.  I am sure that she will have a wonderful time on her cruise in June.  I told her make sure that she drinks a  lot of water.    Volanda Napoleon, MD 3/23/20222:24 PM

## 2021-02-11 MED ORDER — DICLOFENAC SODIUM 75 MG PO TBEC: 75.0000 mg | DELAYED_RELEASE_TABLET | Freq: Two times a day (BID) | ORAL | 0 refills | Status: DC

## 2021-02-12 ENCOUNTER — Other Ambulatory Visit: Payer: Self-pay

## 2021-02-18 ENCOUNTER — Other Ambulatory Visit: Payer: Self-pay | Admitting: Physician Assistant

## 2021-02-18 ENCOUNTER — Ambulatory Visit: Payer: 59 | Admitting: Nurse Practitioner

## 2021-02-26 ENCOUNTER — Inpatient Hospital Stay: Payer: 59 | Admitting: Orthopaedic Surgery

## 2021-03-04 ENCOUNTER — Encounter (HOSPITAL_BASED_OUTPATIENT_CLINIC_OR_DEPARTMENT_OTHER): Payer: Self-pay | Admitting: Orthopaedic Surgery

## 2021-03-04 ENCOUNTER — Ambulatory Visit
Admission: RE | Admit: 2021-03-04 | Discharge: 2021-03-04 | Disposition: A | Discharge status: Home / Self Care | Payer: 59 | Source: Ambulatory Visit | Attending: Nurse Practitioner | Admitting: Nurse Practitioner

## 2021-03-04 ENCOUNTER — Other Ambulatory Visit: Payer: Self-pay

## 2021-03-04 DIAGNOSIS — Z1231 Encounter for screening mammogram for malignant neoplasm of breast: Secondary | ICD-10-CM

## 2021-03-09 ENCOUNTER — Other Ambulatory Visit (HOSPITAL_COMMUNITY)
Admission: RE | Admit: 2021-03-09 | Discharge: 2021-03-09 | Disposition: A | Discharge status: Home / Self Care | Payer: 59 | Source: Ambulatory Visit | Attending: Orthopaedic Surgery | Admitting: Orthopaedic Surgery

## 2021-03-09 DIAGNOSIS — Z01812 Encounter for preprocedural laboratory examination: Secondary | ICD-10-CM | POA: Insufficient documentation

## 2021-03-09 DIAGNOSIS — Z20822 Contact with and (suspected) exposure to covid-19: Secondary | ICD-10-CM | POA: Diagnosis not present

## 2021-03-10 LAB — SARS CORONAVIRUS 2 (TAT 6-24 HRS): SARS Coronavirus 2: NEGATIVE

## 2021-03-11 ENCOUNTER — Encounter (HOSPITAL_BASED_OUTPATIENT_CLINIC_OR_DEPARTMENT_OTHER): Payer: Self-pay | Admitting: Orthopaedic Surgery

## 2021-03-11 DIAGNOSIS — S83241D Other tear of medial meniscus, current injury, right knee, subsequent encounter: Secondary | ICD-10-CM

## 2021-03-11 HISTORY — DX: Other tear of medial meniscus, current injury, right knee, subsequent encounter: S83.241D

## 2021-03-11 NOTE — H&P (Signed)
Melissa Harding is an 60 y.o. female.   Chief Complaint: right knee pain with locking and catching HPI:   The patient is a 60 year old active female with 10 months of right knee pain.  This occurred after exercising about 8 months ago.  She has had at least a steroid injection in the right knee and is worked on activity modification as well as taking anti-inflammatories and rest her knee.  She is worked on Forensic scientist exercises as well.  After continued right knee pain with locking and catching, a MRI was obtained of the right knee showing a complex medial meniscal tear.  Given the failure of treatment combined with her continued symptoms involving the right knee, a right knee arthroscopy has been recommended.  Past Medical History:  Diagnosis Date  . Acute medial meniscus tear, right, subsequent encounter 03/11/2021  . CLL (chronic lymphocytic leukemia) ([place]) 08/07/2020  . Goals of care, counseling/discussion 08/07/2020  . MMT (medial meniscus tear)    right    Past Surgical History:  Procedure Laterality Date  . APPENDECTOMY    . CESAREAN SECTION     2 previous  . ECTOPIC PREGNANCY SURGERY     x2  . LAPAROSCOPIC TUBAL LIGATION    . TUBAL LIGATION      Family History  Problem Relation Age of Onset  . Hypertension Mother   . Hypertension Father   . Cancer Paternal Grandmother   . Stomach cancer Paternal Grandmother   . Heart attack Paternal Grandfather   . Colon cancer Neg Hx   . Colon polyps Neg Hx   . Esophageal cancer Neg Hx   . Rectal cancer Neg Hx    Social History:  reports that she has quit smoking. She has never used smokeless tobacco. She reports current alcohol use of about 1.0 standard drink of alcohol per week. She reports that she does not use drugs.  Allergies:  Allergies  Allergen Reactions  . Ampicillin Other (See Comments)    REACTION: seizures Did it involve swelling of the face/tongue/throat, SOB, or low BP? No Did it involve sudden or severe  rash/hives, skin peeling, or any reaction on the inside of your mouth or nose? No Did you need to seek medical attention at a hospital or doctor's office? Yes When did it last happen?36 yrs ago If all above answers are "NO", may proceed with cephalosporin use.  Marland Kitchen Percocet [Oxycodone-Acetaminophen] Nausea Only    No medications prior to admission.    No results found for this or any previous visit (from the past 48 hour(s)). No results found.  Review of Systems  Musculoskeletal: Positive for joint swelling.  All other systems reviewed and are negative.   Height 5\' 2"  (1.575 m), weight 93 kg. Physical Exam Vitals reviewed.  Constitutional:      Appearance: Normal appearance.  HENT:     Head: Normocephalic and atraumatic.  Eyes:     Extraocular Movements: Extraocular movements intact.     Pupils: Pupils are equal, round, and reactive to light.  Cardiovascular:     Rate and Rhythm: Normal rate and regular rhythm.     Pulses: Normal pulses.  Pulmonary:     Effort: Pulmonary effort is normal.     Breath sounds: Normal breath sounds.  Abdominal:     Palpations: Abdomen is soft.  Musculoskeletal:     Cervical back: Normal range of motion.     Right knee: Effusion present. Tenderness present over the medial joint line. Abnormal  meniscus.  Neurological:     Mental Status: She is alert and oriented to person, place, and time.  Psychiatric:        Behavior: Behavior normal.      Assessment/Plan Right knee with complex medial meniscal tear  The plan is to proceed to surgery for right knee arthroscopy with a partial medial meniscectomy.  The risks and benefits of surgery been explained in detail as well as the rationale behind proceeding with surgery given the failure of conservative treatment and continued mechanical symptoms.  Mcarthur Rossetti, MD 03/11/2021, 7:44 PM

## 2021-03-12 ENCOUNTER — Encounter (HOSPITAL_BASED_OUTPATIENT_CLINIC_OR_DEPARTMENT_OTHER)
Admission: RE | Disposition: A | Discharge status: Home / Self Care | Payer: Self-pay | Source: Home / Self Care | Attending: Orthopaedic Surgery

## 2021-03-12 ENCOUNTER — Encounter (HOSPITAL_BASED_OUTPATIENT_CLINIC_OR_DEPARTMENT_OTHER): Payer: Self-pay | Admitting: Orthopaedic Surgery

## 2021-03-12 ENCOUNTER — Ambulatory Visit (HOSPITAL_BASED_OUTPATIENT_CLINIC_OR_DEPARTMENT_OTHER): Payer: 59 | Admitting: Anesthesiology

## 2021-03-12 ENCOUNTER — Other Ambulatory Visit: Payer: Self-pay

## 2021-03-12 ENCOUNTER — Other Ambulatory Visit: Payer: Self-pay | Admitting: Orthopaedic Surgery

## 2021-03-12 ENCOUNTER — Ambulatory Visit (HOSPITAL_BASED_OUTPATIENT_CLINIC_OR_DEPARTMENT_OTHER)
Admission: RE | Admit: 2021-03-12 | Discharge: 2021-03-12 | Disposition: A | Discharge status: Home / Self Care | Payer: 59 | Attending: Orthopaedic Surgery | Admitting: Orthopaedic Surgery

## 2021-03-12 DIAGNOSIS — X58XXXA Exposure to other specified factors, initial encounter: Secondary | ICD-10-CM | POA: Insufficient documentation

## 2021-03-12 DIAGNOSIS — Z880 Allergy status to penicillin: Secondary | ICD-10-CM | POA: Insufficient documentation

## 2021-03-12 DIAGNOSIS — S83231A Complex tear of medial meniscus, current injury, right knee, initial encounter: Secondary | ICD-10-CM | POA: Diagnosis present

## 2021-03-12 DIAGNOSIS — M94261 Chondromalacia, right knee: Secondary | ICD-10-CM | POA: Insufficient documentation

## 2021-03-12 DIAGNOSIS — Z885 Allergy status to narcotic agent status: Secondary | ICD-10-CM | POA: Diagnosis not present

## 2021-03-12 DIAGNOSIS — S83241D Other tear of medial meniscus, current injury, right knee, subsequent encounter: Secondary | ICD-10-CM

## 2021-03-12 DIAGNOSIS — Z87891 Personal history of nicotine dependence: Secondary | ICD-10-CM | POA: Diagnosis not present

## 2021-03-12 HISTORY — PX: KNEE ARTHROSCOPY: SHX127

## 2021-03-12 HISTORY — DX: Other tear of medial meniscus, current injury, unspecified knee, initial encounter: S83.249A

## 2021-03-12 SURGERY — ARTHROSCOPY, KNEE
Anesthesia: General | Site: Knee | Laterality: Right

## 2021-03-12 MED ORDER — CLINDAMYCIN PHOSPHATE 900 MG/50ML IV SOLN
900.0000 mg | INTRAVENOUS | Status: AC
  Administered 2021-03-12: 900 mg via INTRAVENOUS

## 2021-03-12 MED ORDER — LIDOCAINE HCL (PF) 1 % IJ SOLN
INTRAMUSCULAR | Status: AC
  Filled 2021-03-12: qty 30

## 2021-03-12 MED ORDER — MIDAZOLAM HCL 5 MG/5ML IJ SOLN
INTRAMUSCULAR | Status: DC | PRN
  Administered 2021-03-12: 2 mg via INTRAVENOUS

## 2021-03-12 MED ORDER — DEXAMETHASONE SODIUM PHOSPHATE 4 MG/ML IJ SOLN
INTRAMUSCULAR | Status: DC | PRN
  Administered 2021-03-12: 4 mg via INTRAVENOUS

## 2021-03-12 MED ORDER — HYDROMORPHONE HCL 1 MG/ML IJ SOLN
0.2500 mg | INTRAMUSCULAR | Status: DC | PRN
  Administered 2021-03-12 (×3): 0.25 mg via INTRAVENOUS

## 2021-03-12 MED ORDER — HYDROMORPHONE HCL 1 MG/ML IJ SOLN
INTRAMUSCULAR | Status: AC
  Filled 2021-03-12: qty 0.5

## 2021-03-12 MED ORDER — PROMETHAZINE HCL 25 MG/ML IJ SOLN
6.2500 mg | INTRAMUSCULAR | Status: DC | PRN
  Administered 2021-03-12: 6.25 mg via INTRAVENOUS

## 2021-03-12 MED ORDER — AMISULPRIDE (ANTIEMETIC) 5 MG/2ML IV SOLN
INTRAVENOUS | Status: AC
  Filled 2021-03-12: qty 4

## 2021-03-12 MED ORDER — LIDOCAINE HCL (CARDIAC) PF 100 MG/5ML IV SOSY
PREFILLED_SYRINGE | INTRAVENOUS | Status: DC | PRN
  Administered 2021-03-12: 60 mg via INTRAVENOUS

## 2021-03-12 MED ORDER — PROMETHAZINE HCL 25 MG/ML IJ SOLN
INTRAMUSCULAR | Status: AC
  Filled 2021-03-12: qty 1

## 2021-03-12 MED ORDER — SCOPOLAMINE 1 MG/3DAYS TD PT72
MEDICATED_PATCH | TRANSDERMAL | Status: AC
  Filled 2021-03-12: qty 1

## 2021-03-12 MED ORDER — FENTANYL CITRATE (PF) 100 MCG/2ML IJ SOLN
INTRAMUSCULAR | Status: DC | PRN
  Administered 2021-03-12: 50 ug via INTRAVENOUS
  Administered 2021-03-12 (×2): 25 ug via INTRAVENOUS

## 2021-03-12 MED ORDER — ONDANSETRON HCL 4 MG/2ML IJ SOLN
INTRAMUSCULAR | Status: AC
  Filled 2021-03-12: qty 2

## 2021-03-12 MED ORDER — PROPOFOL 10 MG/ML IV BOLUS
INTRAVENOUS | Status: AC
  Filled 2021-03-12: qty 20

## 2021-03-12 MED ORDER — FENTANYL CITRATE (PF) 100 MCG/2ML IJ SOLN
INTRAMUSCULAR | Status: AC
  Filled 2021-03-12: qty 2

## 2021-03-12 MED ORDER — LIDOCAINE 2% (20 MG/ML) 5 ML SYRINGE
INTRAMUSCULAR | Status: AC
  Filled 2021-03-12: qty 5

## 2021-03-12 MED ORDER — CLINDAMYCIN PHOSPHATE 900 MG/50ML IV SOLN
INTRAVENOUS | Status: AC
  Filled 2021-03-12: qty 50

## 2021-03-12 MED ORDER — ONDANSETRON 4 MG PO TBDP: 4.0000 mg | ORAL_TABLET | Freq: Three times a day (TID) | ORAL | 0 refills | Status: DC | PRN

## 2021-03-12 MED ORDER — SCOPOLAMINE 1 MG/3DAYS TD PT72
1.0000 | MEDICATED_PATCH | TRANSDERMAL | Status: DC
  Administered 2021-03-12: 1.5 mg via TRANSDERMAL

## 2021-03-12 MED ORDER — SODIUM CHLORIDE (PF) 0.9 % IJ SOLN
INTRAMUSCULAR | Status: AC
  Filled 2021-03-12: qty 10

## 2021-03-12 MED ORDER — MORPHINE SULFATE (PF) 4 MG/ML IV SOLN
INTRAVENOUS | Status: AC
  Filled 2021-03-12: qty 1

## 2021-03-12 MED ORDER — DEXAMETHASONE SODIUM PHOSPHATE 10 MG/ML IJ SOLN
INTRAMUSCULAR | Status: AC
  Filled 2021-03-12: qty 1

## 2021-03-12 MED ORDER — SODIUM CHLORIDE 0.9 % IR SOLN
Status: DC | PRN
  Administered 2021-03-12: 1000 mL

## 2021-03-12 MED ORDER — MIDAZOLAM HCL 2 MG/2ML IJ SOLN
INTRAMUSCULAR | Status: AC
  Filled 2021-03-12: qty 2

## 2021-03-12 MED ORDER — BUPIVACAINE HCL (PF) 0.25 % IJ SOLN
INTRAMUSCULAR | Status: DC | PRN
  Administered 2021-03-12: 30 mL

## 2021-03-12 MED ORDER — ACETAMINOPHEN 10 MG/ML IV SOLN
1000.0000 mg | Freq: Once | INTRAVENOUS | Status: AC
  Administered 2021-03-12: 1000 mg via INTRAVENOUS

## 2021-03-12 MED ORDER — AMISULPRIDE (ANTIEMETIC) 5 MG/2ML IV SOLN
10.0000 mg | Freq: Once | INTRAVENOUS | Status: AC | PRN
  Administered 2021-03-12: 10 mg via INTRAVENOUS

## 2021-03-12 MED ORDER — BUPIVACAINE HCL (PF) 0.5 % IJ SOLN
INTRAMUSCULAR | Status: AC
  Filled 2021-03-12: qty 30

## 2021-03-12 MED ORDER — MEPERIDINE HCL 25 MG/ML IJ SOLN: 6.2500 mg | INTRAMUSCULAR | Status: DC | PRN

## 2021-03-12 MED ORDER — BUPIVACAINE HCL (PF) 0.25 % IJ SOLN
INTRAMUSCULAR | Status: AC
  Filled 2021-03-12: qty 30

## 2021-03-12 MED ORDER — LIDOCAINE 2% (20 MG/ML) 5 ML SYRINGE: INTRAMUSCULAR | Status: DC | PRN

## 2021-03-12 MED ORDER — MORPHINE SULFATE (PF) 4 MG/ML IV SOLN
INTRAVENOUS | Status: DC | PRN
  Administered 2021-03-12: 4 mg via SUBCUTANEOUS

## 2021-03-12 MED ORDER — ACETAMINOPHEN 10 MG/ML IV SOLN
INTRAVENOUS | Status: AC
  Filled 2021-03-12: qty 100

## 2021-03-12 MED ORDER — LACTATED RINGERS IV SOLN: INTRAVENOUS | Status: DC

## 2021-03-12 MED ORDER — PROPOFOL 10 MG/ML IV BOLUS
INTRAVENOUS | Status: DC | PRN
  Administered 2021-03-12: 200 mg via INTRAVENOUS

## 2021-03-12 MED ORDER — HYDROCODONE-ACETAMINOPHEN 5-325 MG PO TABS: 1.0000 | ORAL_TABLET | Freq: Four times a day (QID) | ORAL | 0 refills | Status: DC | PRN

## 2021-03-12 SURGICAL SUPPLY — 33 items
BLADE EXCALIBUR 4.0X13 (MISCELLANEOUS) IMPLANT
BNDG ELASTIC 6X5.8 VLCR STR LF (GAUZE/BANDAGES/DRESSINGS) ×2 IMPLANT
COVER WAND RF STERILE (DRAPES) IMPLANT
DISSECTOR  3.8MM X 13CM (MISCELLANEOUS) ×2
DISSECTOR 3.8MM X 13CM (MISCELLANEOUS) ×1 IMPLANT
DRAPE ARTHROSCOPY W/POUCH 90 (DRAPES) ×2 IMPLANT
DRAPE U-SHAPE 47X51 STRL (DRAPES) ×2 IMPLANT
DRSG PAD ABDOMINAL 8X10 ST (GAUZE/BANDAGES/DRESSINGS) ×2 IMPLANT
DURAPREP 26ML APPLICATOR (WOUND CARE) ×2 IMPLANT
ELECT MENISCUS 165MM 90D (ELECTRODE) IMPLANT
ELECT REM PT RETURN 9FT ADLT (ELECTROSURGICAL)
ELECTRODE REM PT RTRN 9FT ADLT (ELECTROSURGICAL) IMPLANT
EXCALIBUR 3.8MM X 13CM (MISCELLANEOUS) IMPLANT
GAUZE SPONGE 4X4 12PLY STRL (GAUZE/BANDAGES/DRESSINGS) ×2 IMPLANT
GAUZE XEROFORM 1X8 LF (GAUZE/BANDAGES/DRESSINGS) ×2 IMPLANT
GLOVE SRG 8 PF TXTR STRL LF DI (GLOVE) ×2 IMPLANT
GLOVE SURG ORTHO LTX SZ7.5 (GLOVE) ×2 IMPLANT
GLOVE SURG ORTHO LTX SZ8 (GLOVE) ×2 IMPLANT
GLOVE SURG UNDER POLY LF SZ8 (GLOVE) ×4
GOWN STRL REUS W/ TWL LRG LVL3 (GOWN DISPOSABLE) ×2 IMPLANT
GOWN STRL REUS W/ TWL XL LVL3 (GOWN DISPOSABLE) ×1 IMPLANT
GOWN STRL REUS W/TWL LRG LVL3 (GOWN DISPOSABLE) ×4
GOWN STRL REUS W/TWL XL LVL3 (GOWN DISPOSABLE) ×2
MANIFOLD NEPTUNE II (INSTRUMENTS) IMPLANT
PACK ARTHROSCOPY DSU (CUSTOM PROCEDURE TRAY) ×2 IMPLANT
PACK BASIN DAY SURGERY FS (CUSTOM PROCEDURE TRAY) ×2 IMPLANT
PADDING CAST COTTON 6X4 STRL (CAST SUPPLIES) ×2 IMPLANT
PENCIL SMOKE EVACUATOR (MISCELLANEOUS) IMPLANT
SUT ETHILON 3 0 PS 1 (SUTURE) ×2 IMPLANT
TOWEL GREEN STERILE FF (TOWEL DISPOSABLE) ×2 IMPLANT
TUBING ARTHROSCOPY IRRIG 16FT (MISCELLANEOUS) ×2 IMPLANT
WATER STERILE IRR 1000ML POUR (IV SOLUTION) ×2 IMPLANT
WRAP KNEE MAXI GEL POST OP (GAUZE/BANDAGES/DRESSINGS) ×2 IMPLANT

## 2021-03-12 NOTE — Transfer of Care (Signed)
Immediate Anesthesia Transfer of Care Note  Patient: Melissa Harding  Procedure(s) Performed: RIGHT KNEE ARTHROSCOPY WITH PARTIAL MEDIAL MENISCECTOMY (Right Knee)  Patient Location: PACU  Anesthesia Type:General  Level of Consciousness: sedated  Airway & Oxygen Therapy: Patient Spontanous Breathing and Patient connected to face mask oxygen  Post-op Assessment: Report given to RN and Post -op Vital signs reviewed and stable  Post vital signs: Reviewed and stable  Last Vitals:  Vitals Value Taken Time  BP 86/62 03/12/21 0812  Temp 36.1 C 03/12/21 0810  Pulse 80 03/12/21 0814  Resp 21 03/12/21 0814  SpO2 95 % 03/12/21 0814  Vitals shown include unvalidated device data.  Last Pain:  Vitals:   03/12/21 0638  TempSrc: Oral  PainSc: 2       Patients Stated Pain Goal: 5 (22/97/98 9211)  Complications: No complications documented.

## 2021-03-12 NOTE — Op Note (Signed)
Melissa Harding, Melissa Harding MEDICAL RECORD NO: [id number] ACCOUNT NO: [id number] DATE OF BIRTH: [birth year] FACILITY: [facility] LOCATION: [facility] PHYSICIAN: Lind Guest. Ninfa Linden, MD  Operative Report   DATE OF PROCEDURE: 03/12/2021  PREOPERATIVE DIAGNOSIS:  Right knee with acute complex medial meniscal tear.  POSTOPERATIVE DIAGNOSIS:  Right knee with acute complex medial meniscal tear.  PROCEDURE:  Right knee arthroscopy with partial medial meniscectomy.  SURGEON:  Lind Guest. Ninfa Linden, MD  ASSISTANT:  Benita Stabile, PA-C  ANESTHESIA: 1.  General. 2.  Local with a mixture of morphine and 0.25% plain Marcaine.  BLOOD LOSS:  Minimal.  COMPLICATIONS:  None.  FINDINGS: 1.  Complex tear of the posterior horn and mid body of medial meniscus. 2.  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  INDICATIONS:  The patient is a 60 year old female who 10 months ago was exercising when she felt a pop in her right knee.  Over the next several months, she tried conservative treatment with activity modification and anti-inflammatories.  She was  developing swelling, locking, and catching of the right knee.  We did place a steroid injection in the knee, and this helped temporize her symptoms, but her symptoms reoccurred with continued mechanical symptoms.  Finally, an MRI was obtained of the  right knee showing a complex tear of the medial meniscus with a flap component.  There was only some mild degenerative thinning of the cartilage of the right knee.  Given her continued mechanical symptoms, an arthroscopic intervention was recommended.   This was also given the failure of conservative treatment as well.  DESCRIPTION OF PROCEDURE:  After informed consent was obtained, appropriate right knee was marked.  She was brought to the operating room and laid supine on the operating table.  General anesthesia was then obtained.  Her right thigh, knee, leg, and  ankle were prepped and draped  with DuraPrep and sterile drapes including a sterile stockinette.  With the bed raised and a lateral leg post utilized, the right operative knee was flexed off the side of the table.  A timeout was called.  She was  identified as correct patient, correct right knee.  We then made anterolateral arthroscopy portal and inserted the cannula into the knee.  There was no significant effusion.  I placed a camera in the knee and went to the medial compartment.  We made an  anteromedial incision on that side and placed an arthroscopic shaver and probe into the knee.  We did find a complex tear of the posterior horn and root to the mid body of the meniscus.  We were able to debride this back to a stable margin with partial  medial meniscectomy using arthroscopic shaver and basket forceps/biters.  We did smooth the cartilage out on the medial femoral condyle where there were some areas where it was flaking.  There was no exposed bone, but was definitely down to grade III  chondromalacia.  The ACL and PCL were then assessed and found to be intact.  The lateral compartment was pristine.  The patellofemoral joint also showed no significant arthritic changes.  We then allowed fluid lavage of the knee and drained all fluid  from the knee.  The portal sites were closed with interrupted nylon suture.  Morphine and Marcaine were then inserted into the portal sites and the knee itself.  A well-padded sterile dressing was applied.  She was awakened, extubated, and taken to  recovery room in stable condition with all final counts being  correct and no complications noted.   [facility] D: 03/12/2021 7:57:50 am T: 03/12/2021 1:47:00 pm  JOB: [id number]/ [id number]

## 2021-03-12 NOTE — Brief Op Note (Signed)
03/12/2021  7:58 AM  PATIENT:  Melissa Harding  60 y.o. female  PRE-OPERATIVE DIAGNOSIS:  RIGHT KNEE MEDIAL MENISCAL TEAR  POST-OPERATIVE DIAGNOSIS:  RIGHT KNEE MEDIAL MENISCAL TEAR  PROCEDURE:  Procedure(s): RIGHT KNEE ARTHROSCOPY WITH PARTIAL MEDIAL MENISCECTOMY (Right)  SURGEON:  Surgeon(s) and Role:    * Mcarthur Rossetti, MD - Primary  PHYSICIAN ASSISTANT:  Benita Stabile, PA-C  ANESTHESIA:   local and general  COUNTS:  YES  TOURNIQUET:  * No tourniquets in log *  DICTATION: .Other Dictation: Dictation Number [id number]  PLAN OF CARE: Discharge to home after PACU  PATIENT DISPOSITION:  PACU - hemodynamically stable.   Delay start of Pharmacological VTE agent (>24hrs) due to surgical blood loss or risk of bleeding: no

## 2021-03-12 NOTE — Anesthesia Preprocedure Evaluation (Signed)
Anesthesia Evaluation  Patient identified by MRN, date of birth, ID band Patient awake    Reviewed: Allergy & Precautions, NPO status , Patient's Chart, lab work & pertinent test results  Airway Mallampati: II  TM Distance: >3 FB Neck ROM: Full    Dental no notable dental hx.    Pulmonary neg pulmonary ROS, former smoker,    Pulmonary exam normal breath sounds clear to auscultation       Cardiovascular negative cardio ROS Normal cardiovascular exam Rhythm:Regular Rate:Normal     Neuro/Psych negative neurological ROS  negative psych ROS   GI/Hepatic negative GI ROS, Neg liver ROS,   Endo/Other  negative endocrine ROS  Renal/GU negative Renal ROS  negative genitourinary   Musculoskeletal negative musculoskeletal ROS (+)   Abdominal (+) + obese,   Peds negative pediatric ROS (+)  Hematology negative hematology ROS (+)   Anesthesia Other Findings   Reproductive/Obstetrics negative OB ROS                             Anesthesia Physical Anesthesia Plan  ASA: II  Anesthesia Plan: General   Post-op Pain Management:    Induction: Intravenous  PONV Risk Score and Plan: 3 and Ondansetron, Dexamethasone, Midazolam and Treatment may vary due to age or medical condition  Airway Management Planned: LMA  Additional Equipment:   Intra-op Plan:   Post-operative Plan: Extubation in OR  Informed Consent: I have reviewed the patients History and Physical, chart, labs and discussed the procedure including the risks, benefits and alternatives for the proposed anesthesia with the patient or authorized representative who has indicated his/her understanding and acceptance.     Dental advisory given  Plan Discussed with: CRNA  Anesthesia Plan Comments:         Anesthesia Quick Evaluation

## 2021-03-12 NOTE — Discharge Instructions (Signed)
No Tylenol until after 2:45pm today if needed  Slowly increase her activities as comfort allows. You may put all of your weight on your right lower extremity/knee as comfort allows. Do expect right knee swelling for some time -ice and elevation intermittently as needed. Do pump your feet several times during the day to help with circulation and to avoid blood clots. You may remove all of your dressings tomorrow and shower getting your incisions wet. Place small Band-Aids over your incisions daily after each shower.   Post Anesthesia Home Care Instructions  Activity: Get plenty of rest for the remainder of the day. A responsible individual must stay with you for 24 hours following the procedure.  For the next 24 hours, DO NOT: -Drive a car -Paediatric nurse -Drink alcoholic beverages -Take any medication unless instructed by your physician -Make any legal decisions or sign important papers.  Meals: Start with liquid foods such as gelatin or soup. Progress to regular foods as tolerated. Avoid greasy, spicy, heavy foods. If nausea and/or vomiting occur, drink only clear liquids until the nausea and/or vomiting subsides. Call your physician if vomiting continues.  Special Instructions/Symptoms: Your throat may feel dry or sore from the anesthesia or the breathing tube placed in your throat during surgery. If this causes discomfort, gargle with warm salt water. The discomfort should disappear within 24 hours.  If you had a scopolamine patch placed behind your ear for the management of post- operative nausea and/or vomiting:  1. The medication in the patch is effective for 72 hours, after which it should be removed.  Wrap patch in a tissue and discard in the trash. Wash hands thoroughly with soap and water. 2. You may remove the patch earlier than 72 hours if you experience unpleasant side effects which may include dry mouth, dizziness or visual disturbances. 3. Avoid touching the patch.  Wash your hands with soap and water after contact with the patch.

## 2021-03-12 NOTE — Interval H&P Note (Signed)
History and Physical Interval Note:  The patient understands that she is here for a right knee arthroscopy to address her right knee medial meniscal tear.  There has been no acute or interval change in her medical status.  See recent H&P.  The risks and benefits of surgery have been discussed in detail and informed consent is obtained.  03/12/2021 7:12 AM  Melissa Harding  has presented today for surgery, with the diagnosis of RIGHT KNEE MEDIAL MENISCAL TEAR.  The various methods of treatment have been discussed with the patient and family. After consideration of risks, benefits and other options for treatment, the patient has consented to  Procedure(s): RIGHT KNEE ARTHROSCOPY WITH PARTIAL MEDIAL MENISCECTOMY (Right) as a surgical intervention.  The patient's history has been reviewed, patient examined, no change in status, stable for surgery.  I have reviewed the patient's chart and labs.  Questions were answered to the patient's satisfaction.     Mcarthur Rossetti

## 2021-03-12 NOTE — Anesthesia Postprocedure Evaluation (Signed)
Anesthesia Post Note  Patient: Melissa Harding  Procedure(s) Performed: RIGHT KNEE ARTHROSCOPY WITH PARTIAL MEDIAL MENISCECTOMY (Right Knee)     Patient location during evaluation: PACU Anesthesia Type: General Level of consciousness: awake and alert Pain management: pain level controlled Vital Signs Assessment: post-procedure vital signs reviewed and stable Respiratory status: spontaneous breathing, nonlabored ventilation and respiratory function stable Cardiovascular status: blood pressure returned to baseline and stable Postop Assessment: no apparent nausea or vomiting Anesthetic complications: no   No complications documented.  Last Vitals:  Vitals:   03/12/21 0915 03/12/21 0926  BP: 114/70   Pulse: 66 (!) 55  Resp: 13 13  Temp:    SpO2: 90% 97%    Last Pain:  Vitals:   03/12/21 0900  TempSrc:   PainSc: [place]

## 2021-03-12 NOTE — Anesthesia Procedure Notes (Signed)
Procedure Name: LMA Insertion Date/Time: 03/12/2021 7:29 AM Performed by: Maryella Shivers, CRNA Pre-anesthesia Checklist: Patient identified, Emergency Drugs available, Suction available and Patient being monitored Patient Re-evaluated:Patient Re-evaluated prior to induction Oxygen Delivery Method: Circle system utilized Preoxygenation: Pre-oxygenation with 100% oxygen Induction Type: IV induction Ventilation: Mask ventilation without difficulty LMA: LMA inserted LMA Size: 4.0 Number of attempts: 1 Airway Equipment and Method: Bite block Placement Confirmation: positive ETCO2 Tube secured with: Tape Dental Injury: Teeth and Oropharynx as per pre-operative assessment

## 2021-03-13 ENCOUNTER — Encounter (HOSPITAL_BASED_OUTPATIENT_CLINIC_OR_DEPARTMENT_OTHER): Payer: Self-pay | Admitting: Orthopaedic Surgery

## 2021-03-19 ENCOUNTER — Ambulatory Visit (INDEPENDENT_AMBULATORY_CARE_PROVIDER_SITE_OTHER): Payer: 59 | Admitting: Orthopaedic Surgery

## 2021-03-19 ENCOUNTER — Encounter: Payer: Self-pay | Admitting: Orthopaedic Surgery

## 2021-03-19 DIAGNOSIS — S83241S Other tear of medial meniscus, current injury, right knee, sequela: Secondary | ICD-10-CM

## 2021-03-19 DIAGNOSIS — M25561 Pain in right knee: Secondary | ICD-10-CM

## 2021-03-19 DIAGNOSIS — Z9889 Other specified postprocedural states: Secondary | ICD-10-CM

## 2021-03-19 NOTE — Progress Notes (Signed)
The patient is 1 week status post a right knee arthroscopy with a partial medial meniscectomy.  We did find a small area of grade III chondromalacia of the medial femoral condyle that we debrided back to a stable margin.  There was no exposed bone.  There is still good amount of remaining meniscal tissue.  I was able to show her arthroscopy pictures.  She is ambulating with a crutch today in the office but she is not using anything at home.  She works as a Programmer, applications but mainly just sitting down and feels like she can get back to that next week which I agree.  Examination of her knee shows the sutures been removed and Steri-Strips applied.  There is no significant effusion at all and good range of motion of the right knee.  She will continue to slowly increase her activities as comfort allows.  I showed her how to work on quad strengthening exercises.  I would like to see her back in 4 weeks to see how she is doing overall but no x-rays are needed.

## 2021-03-24 ENCOUNTER — Telehealth: Payer: Self-pay | Admitting: Orthopaedic Surgery

## 2021-03-24 NOTE — Telephone Encounter (Signed)
I am fine with her having outpatient surgery on that knee that we performed arthroscopic surgery on her in the medial meniscectomy.  Outpatient physical therapy to work on strengthening the knee as well as her balance and coordination.  Thanks

## 2021-03-24 NOTE — Telephone Encounter (Signed)
Pt called stating she had surgery and wasn't recommended for PT but she believes she would benefit from therapy so she would like to have a referral sent. Pt would like a CB to discuss further please.  [phone]

## 2021-03-25 ENCOUNTER — Other Ambulatory Visit: Payer: Self-pay

## 2021-03-25 DIAGNOSIS — Z9889 Other specified postprocedural states: Secondary | ICD-10-CM

## 2021-03-25 NOTE — Telephone Encounter (Signed)
Sent order for scope

## 2021-03-26 ENCOUNTER — Ambulatory Visit: Payer: 59 | Admitting: Rehabilitative and Restorative Service Providers"

## 2021-03-26 ENCOUNTER — Ambulatory Visit: Payer: 59 | Admitting: Nurse Practitioner

## 2021-03-26 ENCOUNTER — Other Ambulatory Visit: Payer: Self-pay

## 2021-03-26 ENCOUNTER — Encounter: Payer: Self-pay | Admitting: Physical Therapy

## 2021-03-26 ENCOUNTER — Ambulatory Visit (INDEPENDENT_AMBULATORY_CARE_PROVIDER_SITE_OTHER): Payer: 59 | Admitting: Physical Therapy

## 2021-03-26 DIAGNOSIS — R262 Difficulty in walking, not elsewhere classified: Secondary | ICD-10-CM | POA: Diagnosis not present

## 2021-03-26 DIAGNOSIS — M6281 Muscle weakness (generalized): Secondary | ICD-10-CM | POA: Diagnosis not present

## 2021-03-26 DIAGNOSIS — R600 Localized edema: Secondary | ICD-10-CM | POA: Diagnosis not present

## 2021-03-26 DIAGNOSIS — M25561 Pain in right knee: Secondary | ICD-10-CM | POA: Diagnosis not present

## 2021-03-26 NOTE — Therapy (Signed)
[facility] [place], [place], [address] Phone: [phone]   Fax:  [phone]  Physical Therapy Evaluation  Patient Details  Name: Melissa Harding MRN: [id number] Date of Birth: 1961-05-15 Referring Provider (PT): Mcarthur Rossetti, MD   Encounter Date: 03/26/2021   PT End of Session - 03/26/21 0903    Visit Number 1    Number of Visits 12    Date for PT Re-Evaluation 05/07/21    PT Start Time 0816   she arrives 16 min late   PT Stop Time 0850    PT Time Calculation (min) 34 min    Activity Tolerance Patient tolerated treatment well    Behavior During Therapy [facility] for tasks assessed/performed           Past Medical History:  Diagnosis Date  . Acute medial meniscus tear, right, subsequent encounter 03/11/2021  . CLL (chronic lymphocytic leukemia) ([place]) 08/07/2020  . Goals of care, counseling/discussion 08/07/2020  . MMT (medial meniscus tear)    right    Past Surgical History:  Procedure Laterality Date  . APPENDECTOMY    . CESAREAN SECTION     2 previous  . ECTOPIC PREGNANCY SURGERY     x2  . KNEE ARTHROSCOPY Right 03/12/2021   Procedure: RIGHT KNEE ARTHROSCOPY WITH PARTIAL MEDIAL MENISCECTOMY;  Surgeon: Mcarthur Rossetti, MD;  Location: [place];  Service: Orthopedics;  Laterality: Right;  . LAPAROSCOPIC TUBAL LIGATION    . TUBAL LIGATION      There were no vitals filed for this visit.    Subjective Assessment - 03/26/21 0819    Subjective Rt knee Scope and debridement with partial medial menisectomy 03/12/21.  MD recommending strengthening the knee as well as her balance and coordination    How long can you stand comfortably? 5 min    How long can you walk comfortably? 10 minutes with crutch    Currently in Pain? Yes    Pain Score 5     Pain Location Knee    Pain Orientation Right    Pain Descriptors / Indicators Pressure;Aching    Pain Type Surgical pain    Pain Radiating Towards  denies N/T does have some pain in her calf    Pain Onset 1 to 4 weeks ago    Pain Frequency Intermittent    Aggravating Factors  standing, knee ROM    Pain Relieving Factors rest, ice    Multiple Pain Sites No              [facility] PT Assessment - 03/26/21 0001      Assessment   Medical Diagnosis Rt knee Scope and debridement with partial medial menisectomy    Referring Provider (PT) Mcarthur Rossetti, MD    Onset Date/Surgical Date 03/12/21    Next MD Visit 04/16/21      Precautions   Precautions None      Restrictions   Weight Bearing Restrictions No      Balance Screen   Has the patient fallen in the past 6 months No    Has the patient had a decrease in activity level because of a fear of falling?  No    Is the patient reluctant to leave their home because of a fear of falling?  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    Leisure walking, shopping      Cognition   Overall Cognitive Status Within Functional Limits for tasks assessed      Observation/Other Assessments   Observations mild to moderate edema, incision sites closed and no signs of infection    Focus on Therapeutic Outcomes (FOTO)  41% functional intake      ROM / Strength   AROM / PROM / Strength AROM;PROM;Strength      AROM   Overall AROM Comments supine, discomfort and guarding noted    AROM Assessment Site Knee    Right/Left Knee Right    Right Knee Extension 5    Right Knee Flexion 50      PROM   Overall PROM Comments in sitting    PROM Assessment Site Knee    Right/Left Knee Right    Right Knee Flexion 75   AAROM using other leg     Strength   Overall Strength Comments tested in sitting    Strength Assessment Site Knee;Hip    Right/Left Hip Right    Right Hip Flexion 3+/5     Right Hip ABduction 3+/5    Right/Left Knee Right    Right Knee Flexion 3+/5    Right Knee Extension 3+/5      Ambulation/Gait   Ambulation/Gait Yes    Ambulation/Gait Assistance 5: Supervision    Ambulation Distance (Feet) 50 Feet   X2   Assistive device L Axillary Crutch    Gait Pattern Step-through pattern;Decreased step length - right;Decreased step length - left;Decreased stance time - right;Decreased hip/knee flexion - right;Decreased weight shift to right    Stairs Yes    Stairs Assistance 5: Supervision    Stairs Assistance Details (indicate cue type and reason) reviewed proper technique of up with good leg and down with bad leg    Stair Management Technique One rail Left;Step to pattern   with one crutch   Number of Stairs 11    Height of Stairs 6      Standardized Balance Assessment   Standardized Balance Assessment Five Times Sit to Stand    Five times sit to stand comments  24.41                      Objective measurements completed on examination: See above findings.       [facility] Adult PT Treatment/Exercise - 03/26/21 0001      Exercises   Exercises Knee/Hip      Knee/Hip Exercises: Stretches   Knee: Self-Stretch Limitations seated tailgate stretch with self overpressure 10 sec X10      Knee/Hip Exercises: Aerobic   Nustep L3 X 5 min                  PT Education - 03/26/21 0903    Education Details HEP, POC,    Person(s) Educated Patient    Methods Explanation;Demonstration;Verbal cues;Handout    Comprehension Verbalized understanding;Need further instruction               PT Long Term Goals - 03/26/21 0935      PT LONG TERM GOAL #1   Title Pt will be I and compliant with HEP. (target for all goals 6 weeks 05/07/21)    Status New    Target Date 05/07/21      PT LONG TERM GOAL #  2   Title Pt will perform 5TSTS test to less tan 13 seconds without UE support to show improved balance and leg strength    Baseline 24.4     Status New      PT LONG TERM GOAL #3   Title Pt will improve Rt knee ROM to WNL.    Status New      PT LONG TERM GOAL #4   Title Pt will improve FOTO to 71% functional    Status New      PT LONG TERM GOAL #5   Title She will improve Rt knee strength to 5/5    Status New                  Plan - 03/26/21 0539    Clinical Impression Statement Pt presents with Rt knee pain, stiffness, and weakness S/P Rt knee Scope and debridement with partial medial menisectomy 03/12/21. She is curently not able to return to work and is using Crutch for ambulation due to pain and difficulty. She did not need AD PLOF for ambulation. She will benefit from skilled PT to address her funcitonal deficits.    Examination-Activity Limitations Bend;Carry;Lift;Stand;Stairs;Squat;Locomotion Level    Examination-Participation Restrictions Cleaning;Community Activity;Driving;Shop;Laundry    Stability/Clinical Decision Making Stable/Uncomplicated    Clinical Decision Making Low    Rehab Potential Good    PT Frequency 2x / week    PT Duration 6 weeks    PT Treatment/Interventions ADLs/Self Care Home Management;Cryotherapy;Electrical Stimulation;Iontophoresis 4mg /ml Dexamethasone;Moist Heat;Ultrasound;Gait training;Stair training;Therapeutic activities;Therapeutic exercise;Balance training;Neuromuscular re-education;Manual techniques;Passive range of motion;Dry needling;Joint Manipulations;Vasopneumatic Device;Taping    PT Next Visit Plan reveiw and updated HEP PRN, initial ROM and gait focus then progress strength as able.    PT Home Exercise Plan Access Code: [email or web address]    Consulted and Agree with Plan of Care Patient;Family member/caregiver    Family Member Consulted husband           Patient will benefit from skilled therapeutic intervention in order to improve the following deficits and impairments:  Decreased activity tolerance,Decreased balance,Decreased strength,Decreased endurance,Decreased  mobility,Decreased range of motion,Increased edema,Difficulty walking,Impaired flexibility,Pain  Visit Diagnosis: Acute pain of right knee  Muscle weakness (generalized)  Difficulty in walking, not elsewhere classified  Localized edema     Problem List Patient Active Problem List   Diagnosis Date Noted  . Acute medial meniscus tear, right, subsequent encounter 03/11/2021  . CLL (chronic lymphocytic leukemia) ([place]) 08/07/2020  . Goals of care, counseling/discussion 08/07/2020  . Lymphocytosis 02/05/2019  . MENOPAUSAL SYNDROME 03/05/2009    [name] 03/26/2021, 9:43 AM  [facility] [address] [place], [place], [address] Phone: [phone]   Fax:  [phone]  Name: Melissa Harding MRN: [id number] Date of Birth: [birth year]

## 2021-03-26 NOTE — Patient Instructions (Signed)
Access Code: [email or web address] URL: [email or web address] Date: 03/26/2021 Prepared by: Elsie Ra  Exercises Seated Knee Flexion Stretch - 2 x daily - 6 x weekly - 1-2 sets - 10 reps - 5 sec hold Seated Hamstring Stretch - 2 x daily - 6 x weekly - 1 sets - 2 reps - 30 hold Standing Gastroc Stretch - 2 x daily - 6 x weekly - 1 sets - 3 reps - 30 hold Supine Quad Set - 2 x daily - 6 x weekly - 2-3 sets - 10 reps - 5 sec hold Supine Active Straight Leg Raise - 2 x daily - 6 x weekly - 1-3 sets - 10 reps Seated Long Arc Quad - 2 x daily - 6 x weekly - 2-3 sets - 10 reps - 3 hold Standing Hip Abduction - 2 x daily - 6 x weekly - 2-3 sets - 10 reps

## 2021-03-31 ENCOUNTER — Other Ambulatory Visit: Payer: Self-pay

## 2021-03-31 ENCOUNTER — Ambulatory Visit (INDEPENDENT_AMBULATORY_CARE_PROVIDER_SITE_OTHER): Payer: 59 | Admitting: Physical Therapy

## 2021-03-31 ENCOUNTER — Encounter: Payer: Self-pay | Admitting: Physical Therapy

## 2021-03-31 DIAGNOSIS — R600 Localized edema: Secondary | ICD-10-CM | POA: Diagnosis not present

## 2021-03-31 DIAGNOSIS — R262 Difficulty in walking, not elsewhere classified: Secondary | ICD-10-CM

## 2021-03-31 DIAGNOSIS — M6281 Muscle weakness (generalized): Secondary | ICD-10-CM | POA: Diagnosis not present

## 2021-03-31 DIAGNOSIS — M25561 Pain in right knee: Secondary | ICD-10-CM

## 2021-03-31 NOTE — Patient Instructions (Signed)
Access Code: [email or web address] URL: [email or web address] Date: 03/31/2021 Prepared by: Daleen Bo  Exercises Seated Knee Flexion Stretch - 2 x daily - 6 x weekly - 1-2 sets - 10 reps - 5 sec hold (add strap for OP) Seated Hamstring Stretch - 2 x daily - 6 x weekly - 1 sets - 2 reps - 30 hold Standing Gastroc Stretch - 2 x daily - 6 x weekly - 1 sets - 3 reps - 30 hold Supine Quad Set - 2 x daily - 6 x weekly - 2-3 sets - 10 reps - 5 sec hold Supine Active Straight Leg Raise - 2 x daily - 6 x weekly - 1-3 sets - 10 reps Seated Long Arc Quad - 2 x daily - 6 x weekly - 2-3 sets - 10 reps - 3 hold Standing Hip Abduction - 2 x daily - 6 x weekly - 2-3 sets - 10 reps

## 2021-03-31 NOTE — Therapy (Signed)
[facility] [address] [place], [place], [address] Phone: [phone]   Fax:  [phone]  Physical Therapy Treatment  Patient Details  Name: Melissa Harding MRN: [id number] Date of Birth: [birth year] Referring Provider (PT): Mcarthur Rossetti, MD   Encounter Date: 03/31/2021   PT End of Session - 03/31/21 1155    Visit Number 2    Number of Visits 12    Date for PT Re-Evaluation 05/07/21    PT Start Time 1103    PT Stop Time 1144    PT Time Calculation (min) 41 min    Activity Tolerance Patient tolerated treatment well    Behavior During Therapy [facility] for tasks assessed/performed           Past Medical History:  Diagnosis Date  . Acute medial meniscus tear, right, subsequent encounter 03/11/2021  . CLL (chronic lymphocytic leukemia) ([place]) 08/07/2020  . Goals of care, counseling/discussion 08/07/2020  . MMT (medial meniscus tear)    right    Past Surgical History:  Procedure Laterality Date  . APPENDECTOMY    . CESAREAN SECTION     2 previous  . ECTOPIC PREGNANCY SURGERY     x2  . KNEE ARTHROSCOPY Right 03/12/2021   Procedure: RIGHT KNEE ARTHROSCOPY WITH PARTIAL MEDIAL MENISCECTOMY;  Surgeon: Mcarthur Rossetti, MD;  Location: [place];  Service: Orthopedics;  Laterality: Right;  . LAPAROSCOPIC TUBAL LIGATION    . TUBAL LIGATION      There were no vitals filed for this visit.   Subjective Assessment - 03/31/21 1153    Subjective Pt states her knee is doing well. She has some difficulty with stairs and walking. She walked 30 mins at the grocery store the other day and was very sore for two days following. She has been compliant with HEP as well as edema management.    How long can you stand comfortably? 5 min    How long can you walk comfortably? 10 minutes with crutch    Currently in Pain? No/denies    Pain Score 0-No pain    Pain Orientation Right    Pain Onset 1 to 4 weeks ago                              [facility] Adult PT Treatment/Exercise - 03/31/21 0001      Ambulation/Gait           Ambulation Distance (Feet) 50 Feet   X2   Assistive device Straight cane    Gait Pattern Step-through pattern;Decreased step length - right;Decreased step length - left;Decreased stance time - right;Decreased hip/knee flexion - right;Decreased weight shift to right                        Gait Comments VC and TC for heel strike and push off on toe      Exercises   Exercises Knee/Hip      Knee/Hip Exercises: Stretches   Knee: Self-Stretch Limitations seated tailgate stretch with self overpressure 10 sec X10      Knee/Hip Exercises: Aerobic   Nustep L3 X 5 min      Knee/Hip Exercises: Standing   Gait Training 32ftx2 with cuing for equal WB and heel toe rolling    Other Standing Knee Exercises heel toe rocking 20x    Other Standing Knee Exercises HR/TR 2x10 each      Knee/Hip Exercises:  Seated   Heel Slides Limitations 10x 10s with strap      Knee/Hip Exercises: Supine   Quad Sets Limitations 5s 15x      Manual Therapy   Manual Therapy Joint mobilization;Soft tissue mobilization    Manual therapy comments patellar mobs all directions grade III; AP tib knee flexion mob in supine grade II-III: knee flexion gapping clinician overpressure                  PT Education - 03/31/21 1154    Education Details anatomy, exercise progression, DOMS expectations, gait quality, muscle firing, HEP, POC    Person(s) Educated Patient    Methods Explanation;Demonstration;Tactile cues;Verbal cues    Comprehension Verbalized understanding;Returned demonstration;Verbal cues required;Tactile cues required               PT Long Term Goals - 03/26/21 0935      PT LONG TERM GOAL #1   Title Pt will be I and compliant with HEP. (target for all goals 6 weeks 05/07/21)    Status New    Target Date 05/07/21      PT LONG TERM GOAL #2   Title Pt will perform 5TSTS  test to less tan 13 seconds without UE support to show improved balance and leg strength    Baseline 24.4    Status New      PT LONG TERM GOAL #3   Title Pt will improve Rt knee ROM to WNL.    Status New      PT LONG TERM GOAL #4   Title Pt will improve FOTO to 71% functional    Status New      PT LONG TERM GOAL #5   Title She will improve Rt knee strength to 5/5    Status New                 Plan - 03/31/21 1156    Clinical Impression Statement Pt presents with decreased knee flexion ROM and stiffness at beginning of today's session that was improved with manual therapy and exercise. Pt was able to tolerate AAROM knee flexion stretching without pain and able to reach ~90 deg of knee flexion at end range. Pt required significant cuing for equal WB, muscle activation, as well as heel toe relationship during gait with SPC. Pt only required SBA-supervision. Trial CKC loaded knee flexion stretching at next session. Pt is most limited in ROM deficits at this time. Pt would benefit from continued skilled therapy in order to reach goals and maximize functional R LE strength and ROM for full return to PLOF.    Examination-Activity Limitations Bend;Carry;Lift;Stand;Stairs;Squat;Locomotion Level    Examination-Participation Restrictions Cleaning;Community Activity;Driving;Shop;Laundry    Stability/Clinical Decision Making Stable/Uncomplicated    Rehab Potential Good    PT Frequency 2x / week    PT Duration 6 weeks    PT Treatment/Interventions ADLs/Self Care Home Management;Cryotherapy;Electrical Stimulation;Iontophoresis 4mg /ml Dexamethasone;Moist Heat;Ultrasound;Gait training;Stair training;Therapeutic activities;Therapeutic exercise;Balance training;Neuromuscular re-education;Manual techniques;Passive range of motion;Dry needling;Joint Manipulations;Vasopneumatic Device;Taping    PT Next Visit Plan reveiw and updated HEP PRN, initial ROM and gait focus then progress strength as able, trial  stair step flexion stretch, wall slide flexion supine with OP    PT Home Exercise Plan Access Code: [email or web address]    Consulted and Agree with Plan of Care Patient;Family member/caregiver    Family Member Consulted husband           Patient will benefit from skilled therapeutic intervention in order to improve the  following deficits and impairments:  Decreased activity tolerance,Decreased balance,Decreased strength,Decreased endurance,Decreased mobility,Decreased range of motion,Increased edema,Difficulty walking,Impaired flexibility,Pain  Visit Diagnosis: Acute pain of right knee  Muscle weakness (generalized)  Difficulty in walking, not elsewhere classified  Localized edema     Problem List Patient Active Problem List   Diagnosis Date Noted  . Acute medial meniscus tear, right, subsequent encounter 03/11/2021  . CLL (chronic lymphocytic leukemia) ([place]) 08/07/2020  . Goals of care, counseling/discussion 08/07/2020  . Lymphocytosis 02/05/2019  . MENOPAUSAL SYNDROME 03/05/2009    Daleen Bo PT, DPT 03/31/21 12:02 PM   [facility] [address] [place], [place], [address] Phone: [phone]   Fax:  [phone]  Name: HEDDY VIDANA MRN: [id number] Date of Birth: [birth year]

## 2021-04-02 ENCOUNTER — Encounter: Payer: 59 | Admitting: Rehabilitative and Restorative Service Providers"

## 2021-04-07 ENCOUNTER — Ambulatory Visit (INDEPENDENT_AMBULATORY_CARE_PROVIDER_SITE_OTHER): Payer: 59 | Admitting: Physical Therapy

## 2021-04-07 ENCOUNTER — Other Ambulatory Visit: Payer: Self-pay

## 2021-04-07 ENCOUNTER — Encounter: Payer: Self-pay | Admitting: Physical Therapy

## 2021-04-07 DIAGNOSIS — R600 Localized edema: Secondary | ICD-10-CM

## 2021-04-07 DIAGNOSIS — R262 Difficulty in walking, not elsewhere classified: Secondary | ICD-10-CM

## 2021-04-07 DIAGNOSIS — M6281 Muscle weakness (generalized): Secondary | ICD-10-CM

## 2021-04-07 DIAGNOSIS — M25561 Pain in right knee: Secondary | ICD-10-CM

## 2021-04-07 NOTE — Therapy (Signed)
[facility] Physical Therapy [address] [place], [place], [address] Phone: [phone]   Fax:  [phone]  Physical Therapy Treatment  Patient Details  Name: Melissa Harding MRN: [id number] Date of Birth: 1961-09-24 Referring Provider (PT): Mcarthur Rossetti, MD   Encounter Date: 04/07/2021   PT End of Session - 04/07/21 0955    Visit Number 3    Number of Visits 12    Date for PT Re-Evaluation 05/07/21    PT Start Time 0933    PT Stop Time 1015    PT Time Calculation (min) 42 min    Activity Tolerance Patient tolerated treatment well    Behavior During Therapy [facility] for tasks assessed/performed           Past Medical History:  Diagnosis Date  . Acute medial meniscus tear, right, subsequent encounter 03/11/2021  . CLL (chronic lymphocytic leukemia) ([place]) 08/07/2020  . Goals of care, counseling/discussion 08/07/2020  . MMT (medial meniscus tear)    right    Past Surgical History:  Procedure Laterality Date  . APPENDECTOMY    . CESAREAN SECTION     2 previous  . ECTOPIC PREGNANCY SURGERY     x2  . KNEE ARTHROSCOPY Right 03/12/2021   Procedure: RIGHT KNEE ARTHROSCOPY WITH PARTIAL MEDIAL MENISCECTOMY;  Surgeon: Mcarthur Rossetti, MD;  Location: [place];  Service: Orthopedics;  Laterality: Right;  . LAPAROSCOPIC TUBAL LIGATION    . TUBAL LIGATION      There were no vitals filed for this visit.   Subjective Assessment - 04/07/21 0933    Subjective Pt states the knee still feels stiff in the morning but the knee feels really good. She is now walking without a cane. Pt states that she paced her walking and standing time.    How long can you stand comfortably? 5 min    How long can you walk comfortably? 10 minutes with crutch    Currently in Pain? No/denies    Pain Score 0-No pain    Pain Onset 1 to 4 weeks ago              [facility] PT Assessment - 04/07/21 0001      AROM   Overall AROM Comments 114 flexion in supine                          [facility] Adult PT Treatment/Exercise - 04/07/21 0001      Ambulation/Gait   Ambulation Distance (Feet) 50 Feet   X2   Assistive device --    Gait Pattern Step-through pattern;Decreased step length - right;Decreased step length - left;Decreased stance time - right;Decreased hip/knee flexion - right;Decreased weight shift to right    Stair Management Technique --    Gait Comments VC and TC for heel strike and push off on toe      Exercises   Exercises Knee/Hip      Knee/Hip Exercises: Stretches   Knee: Self-Stretch Limitations seated tailgate stretch with self overpressure 10 sec X10      Knee/Hip Exercises: Aerobic   Nustep L3 X 5 min      Knee/Hip Exercises: Standing   Gait Training --    Other Standing Knee Exercises STS slightly raised height 10x    Other Standing Knee Exercises HR/TR 20x each      Knee/Hip Exercises: Seated   Heel Slides Limitations 10x 10s with strap      Knee/Hip Exercises: Supine  Quad Sets Limitations 5s 15x    Bridges Limitations 2x10    Straight Leg Raises Limitations 3x10 2lbs      Manual Therapy   Manual Therapy Joint mobilization;Soft tissue mobilization    Manual therapy comments patellar mobs all directions grade III; AP tib knee flexion mob in supine grade II-III: knee flexion gapping clinician overpressure                  PT Education - 04/07/21 0952    Education Details anatomy, exercise progression, DOMS expectations, gait quality, strength progression, HEP, ROM requirements    Person(s) Educated Patient    Methods Explanation;Demonstration;Tactile cues;Verbal cues;Handout    Comprehension Verbalized understanding;Returned demonstration;Verbal cues required;Tactile cues required               PT Long Term Goals - 03/26/21 0935      PT LONG TERM GOAL #1   Title Pt will be I and compliant with HEP. (target for all goals 6 weeks 05/07/21)    Status New    Target Date 05/07/21       PT LONG TERM GOAL #2   Title Pt will perform 5TSTS test to less tan 13 seconds without UE support to show improved balance and leg strength    Baseline 24.4    Status New      PT LONG TERM GOAL #3   Title Pt will improve Rt knee ROM to WNL.    Status New      PT LONG TERM GOAL #4   Title Pt will improve FOTO to 71% functional    Status New      PT LONG TERM GOAL #5   Title She will improve Rt knee strength to 5/5    Status New                 Plan - 04/07/21 0955    Clinical Impression Statement Pt demonstrates significant improvement in gait quality and movement at beginning of today's session. Pt has improved to 114 deg of knee flexion at this time. Pt no longer use SPC and is able to have equal WB through both LE. Pt was able to introduce CKC loading without increased pain but did require cuing for slow eccentric and R LE WB during STS. Slight extensor lag with weight SLR but corrected with for res Pt is most limited in ROM deficits at this time. Pt would benefit from continued skilled therapy in order to reach goals and maximize functional R LE strength and ROM for full return to PLOF.    Examination-Activity Limitations Bend;Carry;Lift;Stand;Stairs;Squat;Locomotion Level    Examination-Participation Restrictions Cleaning;Community Activity;Driving;Shop;Laundry    Stability/Clinical Decision Making Stable/Uncomplicated    Rehab Potential Good    PT Frequency 2x / week    PT Duration 6 weeks    PT Treatment/Interventions ADLs/Self Care Home Management;Cryotherapy;Electrical Stimulation;Iontophoresis 4mg /ml Dexamethasone;Moist Heat;Ultrasound;Gait training;Stair training;Therapeutic activities;Therapeutic exercise;Balance training;Neuromuscular re-education;Manual techniques;Passive range of motion;Dry needling;Joint Manipulations;Vasopneumatic Device;Taping    PT Next Visit Plan reveiw and updated HEP PRN, initial ROM and gait focus then progress strength as able, STS to lower  surface, review bridging, stair step flexion stretch review    PT Home Exercise Plan Access Code: [email or web address]    Consulted and Agree with Plan of Care Patient;Family member/caregiver    Family Member Consulted husband           Patient will benefit from skilled therapeutic intervention in order to improve the following deficits and impairments:  Decreased  activity tolerance,Decreased balance,Decreased strength,Decreased endurance,Decreased mobility,Decreased range of motion,Increased edema,Difficulty walking,Impaired flexibility,Pain  Visit Diagnosis: Acute pain of right knee  Muscle weakness (generalized)  Difficulty in walking, not elsewhere classified  Localized edema     Problem List Patient Active Problem List   Diagnosis Date Noted  . Acute medial meniscus tear, right, subsequent encounter 03/11/2021  . CLL (chronic lymphocytic leukemia) ([place]) 08/07/2020  . Goals of care, counseling/discussion 08/07/2020  . Lymphocytosis 02/05/2019  . MENOPAUSAL SYNDROME 03/05/2009   Daleen Bo PT, DPT 04/07/21 12:00 PM   [facility] [address] [place], [place], [address] Phone: [phone]   Fax:  [phone]  Name: NESA DISTEL MRN: [id number] Date of Birth: [birth year]

## 2021-04-10 ENCOUNTER — Encounter: Payer: Self-pay | Admitting: Rehabilitative and Restorative Service Providers"

## 2021-04-10 ENCOUNTER — Encounter: Payer: 59 | Admitting: Physical Therapy

## 2021-04-10 ENCOUNTER — Ambulatory Visit (INDEPENDENT_AMBULATORY_CARE_PROVIDER_SITE_OTHER): Payer: 59 | Admitting: Rehabilitative and Restorative Service Providers"

## 2021-04-10 ENCOUNTER — Other Ambulatory Visit: Payer: Self-pay

## 2021-04-10 DIAGNOSIS — M25661 Stiffness of right knee, not elsewhere classified: Secondary | ICD-10-CM | POA: Diagnosis not present

## 2021-04-10 DIAGNOSIS — R600 Localized edema: Secondary | ICD-10-CM

## 2021-04-10 DIAGNOSIS — M6281 Muscle weakness (generalized): Secondary | ICD-10-CM | POA: Diagnosis not present

## 2021-04-10 DIAGNOSIS — M25561 Pain in right knee: Secondary | ICD-10-CM

## 2021-04-10 DIAGNOSIS — R262 Difficulty in walking, not elsewhere classified: Secondary | ICD-10-CM

## 2021-04-10 NOTE — Therapy (Signed)
[facility] Physical Therapy [address] [place], [place], [address] Phone: [phone]   Fax:  [phone]  Physical Therapy Treatment  Patient Details  Name: SUMAYA RIEDESEL MRN: [id number] Date of Birth: 1961/03/31 Referring Provider (PT): Mcarthur Rossetti, MD   Encounter Date: 04/10/2021   PT End of Session - 04/10/21 1307    Visit Number 4    Number of Visits 12    Date for PT Re-Evaluation 05/07/21    PT Start Time 7564    PT Stop Time 3329    PT Time Calculation (min) 39 min    Activity Tolerance Patient tolerated treatment well;No increased pain;Patient limited by fatigue    Behavior During Therapy [facility] for tasks assessed/performed           Past Medical History:  Diagnosis Date  . Acute medial meniscus tear, right, subsequent encounter 03/11/2021  . CLL (chronic lymphocytic leukemia) ([place]) 08/07/2020  . Goals of care, counseling/discussion 08/07/2020  . MMT (medial meniscus tear)    right    Past Surgical History:  Procedure Laterality Date  . APPENDECTOMY    . CESAREAN SECTION     2 previous  . ECTOPIC PREGNANCY SURGERY     x2  . KNEE ARTHROSCOPY Right 03/12/2021   Procedure: RIGHT KNEE ARTHROSCOPY WITH PARTIAL MEDIAL MENISCECTOMY;  Surgeon: Mcarthur Rossetti, MD;  Location: [place];  Service: Orthopedics;  Laterality: Right;  . LAPAROSCOPIC TUBAL LIGATION    . TUBAL LIGATION      There were no vitals filed for this visit.   Subjective Assessment - 04/10/21 1222    Subjective Brianne notes progress "with everything" since starting PT.  She needs to "take breaks" with longer standing and walking.    How long can you sit comfortably? Stiffness after prolonged sitting    How long can you stand comfortably? 20 minutes (was 5 minutes)    How long can you walk comfortably? 20 minutes (was 10 with a crutch)    Patient Stated Goals Be able to stand and walk for longer periods of time    Currently in Pain? Yes     Pain Score 3     Pain Location Knee    Pain Orientation Right    Pain Descriptors / Indicators Tightness    Pain Type Surgical pain    Pain Radiating Towards NA    Pain Onset 1 to 4 weeks ago    Pain Frequency Intermittent    Aggravating Factors  Too much WB    Pain Relieving Factors Taking a break to sit and rest, ice    Effect of Pain on Daily Activities Not back to full function due to limitations with standing and walking, stiffness    Multiple Pain Sites No              [facility] PT Assessment - 04/10/21 0001      AROM   Right Knee Flexion 116                         [facility] Adult PT Treatment/Exercise - 04/10/21 0001      Exercises   Exercises Knee/Hip      Knee/Hip Exercises: Stretches   Knee: Self-Stretch Limitations Tailgate knee flexion 3 minutes      Knee/Hip Exercises: Machines for Strengthening   Cybex Knee Extension 10# and 15# up with both (90-30 degrees) and down R only 2 sets of 15 (15X each weight)  Total Gym Leg Press 62# and 81# 15X each avoid hyperextension and slow eccentrics      Knee/Hip Exercises: Seated   Long Arc Quad Strengthening;Both;3 sets;5 reps    Long Arc Quad Limitations 3 seconds seated straight leg raises                  PT Education - 04/10/21 1306    Education Details Progressed quadriceps strengthening.    Person(s) Educated Patient    Methods Explanation;Verbal cues    Comprehension Verbalized understanding;Returned demonstration;Verbal cues required;Need further instruction               PT Long Term Goals - 04/10/21 1307      PT LONG TERM GOAL #1   Title Pt will be I and compliant with HEP. (target for all goals 6 weeks 05/07/21)    Status On-going      PT LONG TERM GOAL #2   Title Pt will perform 5TSTS test to less tan 13 seconds without UE support to show improved balance and leg strength    Baseline 24.4    Status On-going      PT LONG TERM GOAL #3   Title Pt will improve Rt knee ROM to  WNL.    Baseline 116 on 04/10/2021    Status On-going      PT LONG TERM GOAL #4   Title Pt will improve FOTO to 71% functional    Status On-going      PT LONG TERM GOAL #5   Title She will improve Rt knee strength to 5/5    Status On-going                 Plan - 04/10/21 1308    Clinical Impression Statement Renn is very happy with her progress early in her physical therapy treatment.  Quadriceps weakness is noted with strengthening activities and will be a continued focus of both her home and clinic programs.  Flexion AROM is improving as edema decreases (116 degrees of flexion today).    Examination-Activity Limitations Bend;Carry;Lift;Stand;Stairs;Squat;Locomotion Level    Examination-Participation Restrictions Cleaning;Community Activity;Driving;Shop;Laundry    Stability/Clinical Decision Making Stable/Uncomplicated    Rehab Potential Good    PT Frequency 2x / week    PT Duration 6 weeks    PT Treatment/Interventions ADLs/Self Care Home Management;Cryotherapy;Electrical Stimulation;Iontophoresis 4mg /ml Dexamethasone;Moist Heat;Ultrasound;Gait training;Stair training;Therapeutic activities;Therapeutic exercise;Balance training;Neuromuscular re-education;Manual techniques;Passive range of motion;Dry needling;Joint Manipulations;Vasopneumatic Device;Taping    PT Next Visit Plan Quadriceps strengthening with an eccentrics emphasis    PT Home Exercise Plan Access Code: [email or web address]    Consulted and Agree with Plan of Care Patient           Patient will benefit from skilled therapeutic intervention in order to improve the following deficits and impairments:  Decreased activity tolerance,Decreased balance,Decreased strength,Decreased endurance,Decreased mobility,Decreased range of motion,Increased edema,Difficulty walking,Impaired flexibility,Pain  Visit Diagnosis: Difficulty in walking, not elsewhere classified  Localized edema  Muscle weakness (generalized)  Stiffness of  right knee, not elsewhere classified  Acute pain of right knee     Problem List Patient Active Problem List   Diagnosis Date Noted  . Acute medial meniscus tear, right, subsequent encounter 03/11/2021  . CLL (chronic lymphocytic leukemia) ([place]) 08/07/2020  . Goals of care, counseling/discussion 08/07/2020  . Lymphocytosis 02/05/2019  . MENOPAUSAL SYNDROME 03/05/2009    Farley Ly PT, MPT 04/10/2021, 1:11 PM  [facility] Physical Therapy [address] [place], [place], [address] Phone: [phone]  Fax:  [phone]  Name: JEYDI KLINGEL MRN: [id number] Date of Birth: 1961-05-14

## 2021-04-14 ENCOUNTER — Other Ambulatory Visit: Payer: Self-pay

## 2021-04-14 ENCOUNTER — Encounter: Payer: 59 | Admitting: Physical Therapy

## 2021-04-14 ENCOUNTER — Encounter: Payer: Self-pay | Admitting: Physical Therapy

## 2021-04-14 ENCOUNTER — Ambulatory Visit (INDEPENDENT_AMBULATORY_CARE_PROVIDER_SITE_OTHER): Payer: 59 | Admitting: Physical Therapy

## 2021-04-14 DIAGNOSIS — M25661 Stiffness of right knee, not elsewhere classified: Secondary | ICD-10-CM

## 2021-04-14 DIAGNOSIS — R600 Localized edema: Secondary | ICD-10-CM | POA: Diagnosis not present

## 2021-04-14 DIAGNOSIS — R262 Difficulty in walking, not elsewhere classified: Secondary | ICD-10-CM | POA: Diagnosis not present

## 2021-04-14 DIAGNOSIS — M25561 Pain in right knee: Secondary | ICD-10-CM

## 2021-04-14 DIAGNOSIS — M6281 Muscle weakness (generalized): Secondary | ICD-10-CM

## 2021-04-14 NOTE — Therapy (Signed)
[facility] Physical Therapy [address] [place], [place], [address] Phone: [phone]   Fax:  [phone]  Physical Therapy Treatment  Patient Details  Name: Melissa [facility] MRN: [id number] Date of Birth: [birth year] Referring Provider (PT): Mcarthur Rossetti, MD   Encounter Date: 04/14/2021   PT End of Session - 04/14/21 1452    Visit Number 5    Number of Visits 12    Date for PT Re-Evaluation 05/07/21    PT Start Time 1430    PT Stop Time 1510    PT Time Calculation (min) 40 min    Activity Tolerance Patient tolerated treatment well;No increased pain;Patient limited by fatigue    Behavior During Therapy [facility] for tasks assessed/performed           Past Medical History:  Diagnosis Date  . Acute medial meniscus tear, right, subsequent encounter 03/11/2021  . CLL (chronic lymphocytic leukemia) ([place]) 08/07/2020  . Goals of care, counseling/discussion 08/07/2020  . MMT (medial meniscus tear)    right    Past Surgical History:  Procedure Laterality Date  . APPENDECTOMY    . CESAREAN SECTION     2 previous  . ECTOPIC PREGNANCY SURGERY     x2  . KNEE ARTHROSCOPY Right 03/12/2021   Procedure: RIGHT KNEE ARTHROSCOPY WITH PARTIAL MEDIAL MENISCECTOMY;  Surgeon: Mcarthur Rossetti, MD;  Location: [place];  Service: Orthopedics;  Laterality: Right;  . LAPAROSCOPIC TUBAL LIGATION    . TUBAL LIGATION      There were no vitals filed for this visit.   Subjective Assessment - 04/14/21 1451    Subjective Pt arriving to therapy reporting 3/10 pain in right knee.    How long can you sit comfortably? Stiffness after prolonged sitting    How long can you stand comfortably? 20 minutes (was 5 minutes)    How long can you walk comfortably? 20 minutes (was 10 with a crutch)    Patient Stated Goals Be able to stand and walk for longer periods of time    Currently in Pain? Yes    Pain Score 3     Pain Location Knee    Pain Orientation Right     Pain Descriptors / Indicators Sore    Pain Type Surgical pain    Pain Onset 1 to 4 weeks ago                             [facility] Adult PT Treatment/Exercise - 04/14/21 0001      Exercises   Exercises Knee/Hip      Knee/Hip Exercises: Stretches   Knee: Self-Stretch Limitations Tailgate knee flexion 3 minutes      Knee/Hip Exercises: Aerobic   Recumbent Bike L2 x 6 minutes      Knee/Hip Exercises: Machines for Strengthening   Cybex Knee Extension Right LE only: 10# up and slowing lowering focusing on eccentric control x 15    Cybex Knee Flexion right LE only 15# x 15    Total Gym Leg Press 62# 2x15 avoid hyperextension and slow eccentrics      Knee/Hip Exercises: Seated   Long Arc Quad Strengthening;Both;3 sets;5 reps    Long Arc Quad Limitations 3 seconds seated straight leg raises      Knee/Hip Exercises: Supine   Bridges Limitations 15 reps holding 5 seconds    Straight Leg Raise with External Rotation Limitations 15 reps      Manual  Therapy   Manual therapy comments seated R knee IR with distraction, muscle energy contract/relax                       PT Long Term Goals - 04/14/21 1504      PT LONG TERM GOAL #1   Title Pt will be I and compliant with HEP. (target for all goals 6 weeks 05/07/21)    Status On-going      PT LONG TERM GOAL #2   Title Pt will perform 5TSTS test to less tan 13 seconds without UE support to show improved balance and leg strength    Baseline 11 seconds no UE support on 04/14/2021    Status Achieved      PT LONG TERM GOAL #3   Title Pt will improve Rt knee ROM to WNL.      PT LONG TERM GOAL #4   Title Pt will improve FOTO to 71% functional    Status On-going      PT LONG TERM GOAL #5   Title She will improve Rt knee strength to 5/5    Status On-going                 Plan - 04/14/21 1454    Clinical Impression Statement Pt tolerating exercises well today progessing toward her LTG's set. Pt still  with weakness noted in right quads. Focusing on overall strength, ROM and functional mobility. Right knee flexion today 118 degrees passively. Continue skilled PT.    Examination-Activity Limitations Bend;Carry;Lift;Stand;Stairs;Squat;Locomotion Level    Examination-Participation Restrictions Cleaning;Community Activity;Driving;Shop;Laundry    Stability/Clinical Decision Making Stable/Uncomplicated    Rehab Potential Good    PT Frequency 2x / week    PT Duration 6 weeks    PT Treatment/Interventions ADLs/Self Care Home Management;Cryotherapy;Electrical Stimulation;Iontophoresis 4mg /ml Dexamethasone;Moist Heat;Ultrasound;Gait training;Stair training;Therapeutic activities;Therapeutic exercise;Balance training;Neuromuscular re-education;Manual techniques;Passive range of motion;Dry needling;Joint Manipulations;Vasopneumatic Device;Taping    PT Next Visit Plan Quadriceps strengthening with an eccentrics emphasis    PT Home Exercise Plan Access Code: [email or web address]    Consulted and Agree with Plan of Care Patient           Patient will benefit from skilled therapeutic intervention in order to improve the following deficits and impairments:  Decreased activity tolerance,Decreased balance,Decreased strength,Decreased endurance,Decreased mobility,Decreased range of motion,Increased edema,Difficulty walking,Impaired flexibility,Pain  Visit Diagnosis: Difficulty in walking, not elsewhere classified  Localized edema  Muscle weakness (generalized)  Stiffness of right knee, not elsewhere classified  Acute pain of right knee     Problem List Patient Active Problem List   Diagnosis Date Noted  . Acute medial meniscus tear, right, subsequent encounter 03/11/2021  . CLL (chronic lymphocytic leukemia) ([place]) 08/07/2020  . Goals of care, counseling/discussion 08/07/2020  . Lymphocytosis 02/05/2019  . MENOPAUSAL SYNDROME 03/05/2009    Oretha Caprice, PT, MPT 04/14/2021, 3:07 PM  [facility] Physical Therapy [address] [place], [place], [address] Phone: [phone]   Fax:  [phone]  Name: Melissa Harding MRN: [id number] Date of Birth: January 05, 1961

## 2021-04-16 ENCOUNTER — Ambulatory Visit (INDEPENDENT_AMBULATORY_CARE_PROVIDER_SITE_OTHER): Payer: 59 | Admitting: Orthopaedic Surgery

## 2021-04-16 ENCOUNTER — Encounter: Payer: Self-pay | Admitting: Orthopaedic Surgery

## 2021-04-16 ENCOUNTER — Encounter: Payer: 59 | Admitting: Physical Therapy

## 2021-04-16 DIAGNOSIS — Z9889 Other specified postprocedural states: Secondary | ICD-10-CM

## 2021-04-16 NOTE — Progress Notes (Signed)
The patient is now about 5 weeks out from a right knee arthroscopy.  We performed a partial medial meniscectomy.  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  There was no exposed bone.  She is been working through therapy and doing well overall.  She reports decreased pain in her right knee as well as improved strength.  She did sustain a thermal type of burn to the back of her popliteal area on that right knee last night by icing it for too long.  Examination the right knee does show the knee is moving well with no effusion.  Her McMurray's is negative and her Lachman's is negative.  Her skin looks okay on the back of the knee but she is now placed aloe on a daily.  At this point follow-up can be as needed since she is doing well and can be released to follow-up as needed.  She will continue quad strengthening exercises and attempts to lose weight.  I talked about medication such as glucosamine and turmeric.  She is a good candidate down the road for hyaluronic acid if needed.  All questions and concerns were answered and addressed.

## 2021-04-21 ENCOUNTER — Encounter: Payer: 59 | Admitting: Physical Therapy

## 2021-04-23 ENCOUNTER — Ambulatory Visit (INDEPENDENT_AMBULATORY_CARE_PROVIDER_SITE_OTHER): Payer: 59 | Admitting: Physical Therapy

## 2021-04-23 ENCOUNTER — Other Ambulatory Visit: Payer: Self-pay

## 2021-04-23 DIAGNOSIS — R600 Localized edema: Secondary | ICD-10-CM | POA: Diagnosis not present

## 2021-04-23 DIAGNOSIS — R262 Difficulty in walking, not elsewhere classified: Secondary | ICD-10-CM

## 2021-04-23 DIAGNOSIS — M25661 Stiffness of right knee, not elsewhere classified: Secondary | ICD-10-CM

## 2021-04-23 DIAGNOSIS — M6281 Muscle weakness (generalized): Secondary | ICD-10-CM | POA: Diagnosis not present

## 2021-04-23 DIAGNOSIS — M25561 Pain in right knee: Secondary | ICD-10-CM

## 2021-04-23 NOTE — Therapy (Signed)
[facility] Physical Therapy [address] [place], [place], [address] Phone: [phone]   Fax:  [phone]  Physical Therapy Treatment  Patient Details  Name: Melissa Harding MRN: [id number] Date of Birth: 10-25-1961 Referring Provider (PT): Melissa Rossetti, MD   Encounter Date: 04/23/2021   PT End of Session - 04/23/21 0942     Visit Number 6    Number of Visits 12    Date for PT Re-Evaluation 05/07/21    PT Start Time 0845    PT Stop Time 0940    PT Time Calculation (min) 55 min    Activity Tolerance Patient tolerated treatment well;No increased pain;Patient limited by fatigue    Behavior During Therapy [facility] for tasks assessed/performed             Past Medical History:  Diagnosis Date   Acute medial meniscus tear, right, subsequent encounter 03/11/2021   CLL (chronic lymphocytic leukemia) ([place]) 08/07/2020   Goals of care, counseling/discussion 08/07/2020   MMT (medial meniscus tear)    right    Past Surgical History:  Procedure Laterality Date   APPENDECTOMY     CESAREAN SECTION     2 previous   ECTOPIC PREGNANCY SURGERY     x2   KNEE ARTHROSCOPY Right 03/12/2021   Procedure: RIGHT KNEE ARTHROSCOPY WITH PARTIAL MEDIAL MENISCECTOMY;  Surgeon: Melissa Rossetti, MD;  Location: [place];  Service: Orthopedics;  Laterality: Right;   LAPAROSCOPIC TUBAL LIGATION     TUBAL LIGATION      There were no vitals filed for this visit.   Subjective Assessment - 04/23/21 0942     Subjective Pt relays her knee is doing good overall and MD was pleased with her progress.    How long can you sit comfortably? Stiffness after prolonged sitting    How long can you stand comfortably? 20 minutes (was 5 minutes)    How long can you walk comfortably? 20 minutes (was 10 with a crutch)    Patient Stated Goals Be able to stand and walk for longer periods of time    Pain Onset 1 to 4 weeks ago                Melissa Harding PT Assessment  - 04/23/21 0001       Assessment   Medical Diagnosis Rt knee Scope and debridement with partial medial menisectomy    Referring Provider (PT) Melissa Rossetti, MD    Onset Date/Surgical Date 03/12/21      AROM   Right/Left Knee Left    Right Knee Extension 0    Right Knee Flexion 110    Left Knee Flexion 129      PROM   Right Knee Extension 0    Right Knee Flexion 120                           [facility] Adult PT Treatment/Exercise - 04/23/21 0001       Knee/Hip Exercises: Aerobic   Recumbent Bike L2 x 8 minutes      Knee/Hip Exercises: Machines for Strengthening   Cybex Knee Extension 10# up with both down with Rt only 3X10    Cybex Knee Flexion right LE only 15# 3X10    Total Gym Leg Press 75# DL 2X15, then Rt leg 50# 2X10      Knee/Hip Exercises: Standing   Lateral Step Up Both;10 reps;Hand Hold: 2;Step Height: 6"  Forward Step Up Right;15 reps;Hand Hold: 1;Step Height: 6"      Knee/Hip Exercises: Supine   Bridges Limitations 15 reps holding 5 seconds    Straight Leg Raise with External Rotation Limitations 15 reps      Manual Therapy   Manual therapy comments supine Rt knee flexion PROM            Vasopnuematic device to Rt knee X10 min with medium compression at 34 deg at her request.             PT Long Term Goals - 04/14/21 1504       PT LONG TERM GOAL #1   Title Pt will be I and compliant with HEP. (target for all goals 6 weeks 05/07/21)    Status On-going      PT LONG TERM GOAL #2   Title Pt will perform 5TSTS test to less tan 13 seconds without UE support to show improved balance and leg strength    Baseline 11 seconds no UE support on 04/14/2021    Status Achieved      PT LONG TERM GOAL #3   Title Pt will improve Rt knee ROM to WNL.      PT LONG TERM GOAL #4   Title Pt will improve FOTO to 71% functional    Status On-going      PT LONG TERM GOAL #5   Title She will improve Rt knee strength to 5/5    Status  On-going                   Plan - 04/23/21 0943     Clinical Impression Statement She had good tolerance to strength progression today and updated measurments show improved Rt knee PROM and she is only about 9 degrees from having full flexion ROM equal to her other leg. Continue POC.    Examination-Activity Limitations Bend;Carry;Lift;Stand;Stairs;Squat;Locomotion Level    Examination-Participation Restrictions Cleaning;Community Activity;Driving;Shop;Laundry    Stability/Clinical Decision Making Stable/Uncomplicated    Rehab Potential Good    PT Frequency 2x / week    PT Duration 6 weeks    PT Treatment/Interventions ADLs/Self Care Home Management;Cryotherapy;Electrical Stimulation;Iontophoresis 4mg /ml Dexamethasone;Moist Heat;Ultrasound;Gait training;Stair training;Therapeutic activities;Therapeutic exercise;Balance training;Neuromuscular re-education;Manual techniques;Passive range of motion;Dry needling;Joint Manipulations;Vasopneumatic Device;Taping    PT Next Visit Plan Quadriceps strengthening with an eccentrics emphasis    PT Home Exercise Plan Access Code: [email or web address]    Consulted and Agree with Plan of Care Patient             Patient will benefit from skilled therapeutic intervention in order to improve the following deficits and impairments:  Decreased activity tolerance, Decreased balance, Decreased strength, Decreased endurance, Decreased mobility, Decreased range of motion, Increased edema, Difficulty walking, Impaired flexibility, Pain  Visit Diagnosis: Difficulty in walking, not elsewhere classified  Localized edema  Muscle weakness (generalized)  Stiffness of right knee, not elsewhere classified  Acute pain of right knee     Problem List Patient Active Problem List   Diagnosis Date Noted   Acute medial meniscus tear, right, subsequent encounter 03/11/2021   CLL (chronic lymphocytic leukemia) ([place]) 08/07/2020   Goals of care, counseling/discussion  08/07/2020   Lymphocytosis 02/05/2019   MENOPAUSAL SYNDROME 03/05/2009    Melissa Harding 04/23/2021, 9:45 AM  [facility] Physical Therapy [address] [place], [place], [address] Phone: [phone]   Fax:  [phone]  Name: Melissa Harding MRN: [id number] Date of Birth: 06/05/1961

## 2021-04-28 ENCOUNTER — Encounter: Payer: 59 | Admitting: Physical Therapy

## 2021-04-29 ENCOUNTER — Telehealth: Payer: Self-pay

## 2021-04-30 ENCOUNTER — Ambulatory Visit (INDEPENDENT_AMBULATORY_CARE_PROVIDER_SITE_OTHER): Payer: 59 | Admitting: Physical Therapy

## 2021-04-30 ENCOUNTER — Encounter: Payer: Self-pay | Admitting: Orthopaedic Surgery

## 2021-04-30 ENCOUNTER — Other Ambulatory Visit: Payer: Self-pay

## 2021-04-30 DIAGNOSIS — R600 Localized edema: Secondary | ICD-10-CM | POA: Diagnosis not present

## 2021-04-30 DIAGNOSIS — M6281 Muscle weakness (generalized): Secondary | ICD-10-CM | POA: Diagnosis not present

## 2021-04-30 DIAGNOSIS — M25661 Stiffness of right knee, not elsewhere classified: Secondary | ICD-10-CM | POA: Diagnosis not present

## 2021-04-30 DIAGNOSIS — R262 Difficulty in walking, not elsewhere classified: Secondary | ICD-10-CM

## 2021-04-30 DIAGNOSIS — M25561 Pain in right knee: Secondary | ICD-10-CM

## 2021-04-30 NOTE — Therapy (Signed)
[facility] Physical Therapy [address] [place], [place], [address] Phone: [phone]   Fax:  [phone]  Physical Therapy Treatment  Patient Details  Name: Melissa Harding MRN: [id number] Date of Birth: Aug 06, 1961 Referring Provider (PT): Mcarthur Rossetti, MD   Encounter Date: 04/30/2021   PT End of Session - 04/30/21 0930     Visit Number 7    Number of Visits 12    Date for PT Re-Evaluation 05/07/21    PT Start Time 0845    PT Stop Time 0935    PT Time Calculation (min) 50 min    Activity Tolerance Patient tolerated treatment well;No increased pain;Patient limited by fatigue    Behavior During Therapy [facility] for tasks assessed/performed             Past Medical History:  Diagnosis Date   Acute medial meniscus tear, right, subsequent encounter 03/11/2021   CLL (chronic lymphocytic leukemia) ([place]) 08/07/2020   Goals of care, counseling/discussion 08/07/2020   MMT (medial meniscus tear)    right    Past Surgical History:  Procedure Laterality Date   APPENDECTOMY     CESAREAN SECTION     2 previous   ECTOPIC PREGNANCY SURGERY     x2   KNEE ARTHROSCOPY Right 03/12/2021   Procedure: RIGHT KNEE ARTHROSCOPY WITH PARTIAL MEDIAL MENISCECTOMY;  Surgeon: Mcarthur Rossetti, MD;  Location: [place];  Service: Orthopedics;  Laterality: Right;   LAPAROSCOPIC TUBAL LIGATION     TUBAL LIGATION      There were no vitals filed for this visit.   Subjective Assessment - 04/30/21 0929     Subjective Pt relays she is doing well, not much pain overall in her knee and it feels less stiff    How long can you sit comfortably? Stiffness after prolonged sitting    How long can you stand comfortably? 20 minutes (was 5 minutes)    How long can you walk comfortably? 20 minutes (was 10 with a crutch)    Patient Stated Goals Be able to stand and walk for longer periods of time    Pain Onset 1 to 4 weeks ago                [facility] PT  Assessment - 04/30/21 0001       Assessment   Medical Diagnosis Rt knee Scope and debridement with partial medial menisectomy    Referring Provider (PT) Mcarthur Rossetti, MD    Onset Date/Surgical Date 03/12/21      AROM   Right/Left Knee Left    Right Knee Extension 0    Right Knee Flexion 115      PROM   Right Knee Extension 0    Right Knee Flexion 124                           [facility] Adult PT Treatment/Exercise - 04/30/21 0001       Knee/Hip Exercises: Aerobic   Recumbent Bike L2 X 10 min      Knee/Hip Exercises: Machines for Strengthening   Cybex Knee Extension 10# up with both down with Rt only 2X15    Cybex Knee Flexion right LE only 20# 3X10    Total Gym Leg Press 81# DL 2X15, then Rt leg 56# 3X10      Knee/Hip Exercises: Standing   Lateral Step Up Right;10 reps;Hand Hold: 1;Step Height: 6"    Forward Step Up  Right;15 reps;Hand Hold: 1;Step Height: 6"      Modalities   Modalities Vasopneumatic      Vasopneumatic   Number Minutes Vasopneumatic  10 minutes    Vasopnuematic Location  Knee    Vasopneumatic Pressure Medium    Vasopneumatic Temperature  34      Manual Therapy   Manual therapy comments supine Rt knee flexion PROM, manual H.S. Stretching                         PT Long Term Goals - 04/14/21 1504       PT LONG TERM GOAL #1   Title Pt will be I and compliant with HEP. (target for all goals 6 weeks 05/07/21)    Status On-going      PT LONG TERM GOAL #2   Title Pt will perform 5TSTS test to less tan 13 seconds without UE support to show improved balance and leg strength    Baseline 11 seconds no UE support on 04/14/2021    Status Achieved      PT LONG TERM GOAL #3   Title Pt will improve Rt knee ROM to WNL.      PT LONG TERM GOAL #4   Title Pt will improve FOTO to 71% functional    Status On-going      PT LONG TERM GOAL #5   Title She will improve Rt knee strength to 5/5    Status On-going                    Plan - 04/30/21 0930     Clinical Impression Statement She is doing very well with her knee funciton, only has mild deficits in ROM and strength now. She may be ready to finish up early if she continues to progress the way she is so we will assess if she is ready for DC at end of next week.    Examination-Activity Limitations Bend;Carry;Lift;Stand;Stairs;Squat;Locomotion Level    Examination-Participation Restrictions Cleaning;Community Activity;Driving;Shop;Laundry    Stability/Clinical Decision Making Stable/Uncomplicated    Rehab Potential Good    PT Frequency 2x / week    PT Duration 6 weeks    PT Treatment/Interventions ADLs/Self Care Home Management;Cryotherapy;Electrical Stimulation;Iontophoresis 4mg /ml Dexamethasone;Moist Heat;Ultrasound;Gait training;Stair training;Therapeutic activities;Therapeutic exercise;Balance training;Neuromuscular re-education;Manual techniques;Passive range of motion;Dry needling;Joint Manipulations;Vasopneumatic Device;Taping    PT Next Visit Plan Quadriceps strengthening with an eccentrics emphasis, likely will DC early    PT Home Exercise Plan Access Code: [email or web address]    Consulted and Agree with Plan of Care Patient             Patient will benefit from skilled therapeutic intervention in order to improve the following deficits and impairments:  Decreased activity tolerance, Decreased balance, Decreased strength, Decreased endurance, Decreased mobility, Decreased range of motion, Increased edema, Difficulty walking, Impaired flexibility, Pain  Visit Diagnosis: Difficulty in walking, not elsewhere classified  Localized edema  Muscle weakness (generalized)  Stiffness of right knee, not elsewhere classified  Acute pain of right knee     Problem List Patient Active Problem List   Diagnosis Date Noted   Acute medial meniscus tear, right, subsequent encounter 03/11/2021   CLL (chronic lymphocytic leukemia) ([place]) 08/07/2020    Goals of care, counseling/discussion 08/07/2020   Lymphocytosis 02/05/2019   MENOPAUSAL SYNDROME 03/05/2009    [name] 04/30/2021, 9:32 AM  [facility] Physical Therapy [address] [place], [place], [address] Phone: [phone]   Fax:  [phone]  Name: Melissa Harding MRN: [id number] Date of Birth: Sep 09, 1961

## 2021-05-04 ENCOUNTER — Ambulatory Visit (INDEPENDENT_AMBULATORY_CARE_PROVIDER_SITE_OTHER): Payer: 59 | Admitting: Physical Therapy

## 2021-05-04 ENCOUNTER — Other Ambulatory Visit: Payer: Self-pay

## 2021-05-04 DIAGNOSIS — M25661 Stiffness of right knee, not elsewhere classified: Secondary | ICD-10-CM

## 2021-05-04 DIAGNOSIS — R600 Localized edema: Secondary | ICD-10-CM | POA: Diagnosis not present

## 2021-05-04 DIAGNOSIS — M25561 Pain in right knee: Secondary | ICD-10-CM

## 2021-05-04 DIAGNOSIS — M6281 Muscle weakness (generalized): Secondary | ICD-10-CM | POA: Diagnosis not present

## 2021-05-04 DIAGNOSIS — R262 Difficulty in walking, not elsewhere classified: Secondary | ICD-10-CM | POA: Diagnosis not present

## 2021-05-04 NOTE — Therapy (Addendum)
[facility] Physical Therapy [address] [place], [place], [address] Phone: [phone]   Fax:  [phone]  Physical Therapy Treatment/Discharge addendum PHYSICAL THERAPY DISCHARGE SUMMARY  Visits from Start of Care: 8  Current functional level related to goals / functional outcomes: See below   Remaining deficits: See below   Education / Equipment: Final HEP  Plan: Patient agrees to discharge.  Patient goals were met. Patient is being discharged due to meeting the stated rehab goals.    She was going to come one more visit on 05/07/21 but she could not find her keys to make it in so PT called her and we will send her electronic copy of HEP and discharge. She is in agreement and has no further questions or concerns  Elsie Ra, PT, DPT 05/07/21 9:12 AM     Patient Details  Name: Melissa Harding MRN: [id number] Date of Birth: 04-30-1961 Referring Provider (PT): Mcarthur Rossetti, MD   Encounter Date: 05/04/2021   PT End of Session - 05/04/21 0833     Visit Number 8    Number of Visits 12    Date for PT Re-Evaluation 05/07/21    PT Start Time 0809    PT Stop Time 7972    PT Time Calculation (min) 38 min    Activity Tolerance Patient tolerated treatment well;No increased pain;Patient limited by fatigue    Behavior During Therapy [facility] for tasks assessed/performed             Past Medical History:  Diagnosis Date   Acute medial meniscus tear, right, subsequent encounter 03/11/2021   CLL (chronic lymphocytic leukemia) ([place]) 08/07/2020   Goals of care, counseling/discussion 08/07/2020   MMT (medial meniscus tear)    right    Past Surgical History:  Procedure Laterality Date   APPENDECTOMY     CESAREAN SECTION     2 previous   ECTOPIC PREGNANCY SURGERY     x2   KNEE ARTHROSCOPY Right 03/12/2021   Procedure: RIGHT KNEE ARTHROSCOPY WITH PARTIAL MEDIAL MENISCECTOMY;  Surgeon: Mcarthur Rossetti, MD;  Location: [place];  Service: Orthopedics;  Laterality: Right;   LAPAROSCOPIC TUBAL LIGATION     TUBAL LIGATION      There were no vitals filed for this visit.   Subjective Assessment - 05/04/21 0833     Subjective Pt relays her Rt knee is feeling better, minimal pain and the swelling and stiffness are improving.    How long can you sit comfortably? Stiffness after prolonged sitting    How long can you stand comfortably? 20 minutes (was 5 minutes)    How long can you walk comfortably? 20 minutes (was 10 with a crutch)    Patient Stated Goals Be able to stand and walk for longer periods of time    Pain Onset 1 to 4 weeks ago              [facility] Adult PT Treatment/Exercise - 05/04/21 0001       Knee/Hip Exercises: Stretches   Sports administrator Right;3 reps;30 seconds    Quad Stretch Limitations supine with strap leg off EOB      Knee/Hip Exercises: Aerobic   Recumbent Bike L3 X10      Knee/Hip Exercises: Machines for Strengthening   Cybex Knee Extension 15# up with both down with Rt only 3X10    Cybex Knee Flexion right LE only 25# 3X10    Total Gym Leg Press 112# DL 3X10  Knee/Hip Exercises: Seated   Sit to Sand 2 sets;10 reps;without UE support   slow eccentrics     Manual Therapy   Manual therapy comments supine Rt knee flexion PROM, manual H.S. Stretching                         PT Long Term Goals - 04/14/21 1504       PT LONG TERM GOAL #1   Title Pt will be I and compliant with HEP. (target for all goals 6 weeks 05/07/21)    Status On-going      PT LONG TERM GOAL #2   Title Pt will perform 5TSTS test to less tan 13 seconds without UE support to show improved balance and leg strength    Baseline 11 seconds no UE support on 04/14/2021    Status Achieved      PT LONG TERM GOAL #3   Title Pt will improve Rt knee ROM to WNL.      PT LONG TERM GOAL #4   Title Pt will improve FOTO to 71% functional    Status On-going      PT LONG TERM GOAL #5   Title She  will improve Rt knee strength to 5/5    Status On-going                   Plan - 05/04/21 0845     Clinical Impression Statement Plan is to finalize and prioritize her HEP and discharge next visit due to her progress and will have met all PT goals.    Examination-Activity Limitations Bend;Carry;Lift;Stand;Stairs;Squat;Locomotion Level    Examination-Participation Restrictions Cleaning;Community Activity;Driving;Shop;Laundry    Stability/Clinical Decision Making Stable/Uncomplicated    Rehab Potential Good    PT Frequency 2x / week    PT Duration 6 weeks    PT Treatment/Interventions ADLs/Self Care Home Management;Cryotherapy;Electrical Stimulation;Iontophoresis 41m/ml Dexamethasone;Moist Heat;Ultrasound;Gait training;Stair training;Therapeutic activities;Therapeutic exercise;Balance training;Neuromuscular re-education;Manual techniques;Passive range of motion;Dry needling;Joint Manipulations;Vasopneumatic Device;Taping    PT Next Visit Plan finalize HEP include quad stretch, FOTO,DC    PT Home Exercise Plan Access Code: [email or web address]   Consulted and Agree with Plan of Care Patient             Patient will benefit from skilled therapeutic intervention in order to improve the following deficits and impairments:  Decreased activity tolerance, Decreased balance, Decreased strength, Decreased endurance, Decreased mobility, Decreased range of motion, Increased edema, Difficulty walking, Impaired flexibility, Pain  Visit Diagnosis: Difficulty in walking, not elsewhere classified  Localized edema  Muscle weakness (generalized)  Stiffness of right knee, not elsewhere classified  Acute pain of right knee     Problem List Patient Active Problem List   Diagnosis Date Noted   Acute medial meniscus tear, right, subsequent encounter 03/11/2021   [place] 08/07/[place]   Goals of care, counseling/discussion 08/07/2020   Lymphocytosis 02/05/2019    MENOPAUSAL SYNDROME 03/05/2009    BSilvestre Mesi6/20/2022, 8:46 AM  [facility] [address] [place] [address]Phone: [phone]  Fax:  [phone] Name: Melissa THRUSHMRN: [id number] of Birth: 2Dec 10, 1962

## 2021-05-05 ENCOUNTER — Encounter: Payer: 59 | Admitting: Physical Therapy

## 2021-05-07 ENCOUNTER — Encounter: Payer: 59 | Admitting: Physical Therapy

## 2021-05-11 ENCOUNTER — Encounter: Payer: 59 | Admitting: Rehabilitative and Restorative Service Providers"

## 2021-05-12 ENCOUNTER — Encounter: Payer: 59 | Admitting: Physical Therapy

## 2021-05-14 ENCOUNTER — Encounter: Payer: 59 | Admitting: Physical Therapy

## 2021-05-19 ENCOUNTER — Encounter: Payer: 59 | Admitting: Physical Therapy

## 2021-05-20 ENCOUNTER — Encounter: Payer: 59 | Admitting: Physical Therapy

## 2021-05-25 ENCOUNTER — Ambulatory Visit: Payer: 59 | Admitting: Nurse Practitioner

## 2021-06-09 ENCOUNTER — Other Ambulatory Visit: Payer: Self-pay

## 2021-06-09 ENCOUNTER — Ambulatory Visit (INDEPENDENT_AMBULATORY_CARE_PROVIDER_SITE_OTHER): Payer: 59 | Admitting: Orthopaedic Surgery

## 2021-06-09 DIAGNOSIS — M25561 Pain in right knee: Secondary | ICD-10-CM

## 2021-06-09 DIAGNOSIS — S83241S Other tear of medial meniscus, current injury, right knee, sequela: Secondary | ICD-10-CM

## 2021-06-09 DIAGNOSIS — G8929 Other chronic pain: Secondary | ICD-10-CM

## 2021-06-09 DIAGNOSIS — Z9889 Other specified postprocedural states: Secondary | ICD-10-CM

## 2021-06-09 MED ORDER — LIDOCAINE HCL 1 % IJ SOLN
3.0000 mL | INTRAMUSCULAR | Status: AC | PRN
  Administered 2021-06-09: 3 mL

## 2021-06-09 MED ORDER — METHYLPREDNISOLONE ACETATE 40 MG/ML IJ SUSP
40.0000 mg | INTRAMUSCULAR | Status: AC | PRN
  Administered 2021-06-09: 40 mg via INTRA_ARTICULAR

## 2021-06-09 NOTE — Progress Notes (Signed)
Office Visit Note   Patient: Melissa Harding           Date of Birth: 01-22-1961           MRN: [id number] Visit Date: 06/09/2021              Requested by: Gildardo Pounds, NP [place],  [place] [id number] PCP: Gildardo Pounds, NP   Assessment & Plan: Visit Diagnoses:  1. Chronic pain of right knee   2. Status post arthroscopy of right knee   3. Acute medial meniscal tear, right, sequela     Plan: Given that she is now 3 months out from surgery on her right knee I did recommend a steroid injection to see if this will calm down some of the inflammatory pain postoperative.  She agrees with this treatment plan.  She did tolerate the steroid injection well.  I would like to see her back in 6 weeks to see how she is doing overall.  Given the chondromalacia and cartilage wear in the medial compartment of her knee, she may be a good candidate for hyaluronic acid down the road.  Follow-Up Instructions: Return in about 6 weeks (around 07/21/2021).   Orders:  Orders Placed This Encounter  Procedures   Large Joint Inj    No orders of the defined types were placed in this encounter.     Procedures: Large Joint Inj: R knee on 06/09/2021 9:38 AM Indications: diagnostic evaluation and pain Details: 22 G 1.5 in needle, superolateral approach  Arthrogram: No  Medications: 3 mL lidocaine 1 %; 40 mg methylPREDNISolone acetate 40 MG/ML Outcome: tolerated well, no immediate complications Procedure, treatment alternatives, risks and benefits explained, specific risks discussed. Consent was given by the patient. Immediately prior to procedure a time out was called to verify the correct patient, procedure, equipment, support staff and site/side marked as required. Patient was prepped and draped in the usual sterile fashion.      Clinical Data: No additional findings.   Subjective: Chief Complaint  Patient presents with   Right Knee - Pain  The patient is now 13 weeks  status post a right knee arthroscopy with a partial medial meniscectomy.  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  She had a fall recently injuring that right knee and had some bruising which she showed me pictures more in the thigh.  She still having some medial joint line tenderness but overall is making progress.  HPI  Review of Systems There is currently listed no headache, chest pain, shortness of breath, fever, chills, nausea, vomiting  Objective: Vital Signs: There were no vitals taken for this visit.  Physical Exam She is alert and orient x3 and in no acute distress Ortho Exam Examination of her right knee shows slight varus malalignment.  There is no effusion.  She has good range of motion of that knee but definitely still medial joint line tenderness Specialty Comments:  No specialty comments available.  Imaging: No results found.   PMFS History: Patient Active Problem List   Diagnosis Date Noted   Acute medial meniscus tear, right, subsequent encounter 03/11/2021   CLL (chronic lymphocytic leukemia) ([place]) 08/07/2020   Goals of care, counseling/discussion 08/07/2020   Lymphocytosis 02/05/2019   MENOPAUSAL SYNDROME 03/05/2009   Past Medical History:  Diagnosis Date   Acute medial meniscus tear, right, subsequent encounter 03/11/2021   CLL (chronic lymphocytic leukemia) ([place]) 08/07/2020  Goals of care, counseling/discussion 08/07/2020   MMT (medial meniscus tear)    right    Family History  Problem Relation Age of Onset   Hypertension Mother    Hypertension Father    Cancer Paternal Grandmother    Stomach cancer Paternal Grandmother    Heart attack Paternal Grandfather    Colon cancer Neg Hx    Colon polyps Neg Hx    Esophageal cancer Neg Hx    Rectal cancer Neg Hx     Past Surgical History:  Procedure Laterality Date   APPENDECTOMY     CESAREAN SECTION     2 previous   ECTOPIC PREGNANCY SURGERY     x2    KNEE ARTHROSCOPY Right 03/12/2021   Procedure: RIGHT KNEE ARTHROSCOPY WITH PARTIAL MEDIAL MENISCECTOMY;  Surgeon: Mcarthur Rossetti, MD;  Location: [place];  Service: Orthopedics;  Laterality: Right;   LAPAROSCOPIC TUBAL LIGATION     TUBAL LIGATION     Social History   Occupational History   Occupation: part time    Comment: self employed  Tobacco Use   Smoking status: Former    Types: Cigarettes    Quit date: 2000    Years since quitting: 22.5   Smokeless tobacco: Never  Vaping Use   Vaping Use: Never used  Substance and Sexual Activity   Alcohol use: Yes    Alcohol/week: 1.0 standard drink    Types: 1 Glasses of wine per week    Comment: social   Drug use: No   Sexual activity: Yes    Birth control/protection: Surgical    Comment: BTL

## 2021-07-21 ENCOUNTER — Ambulatory Visit (INDEPENDENT_AMBULATORY_CARE_PROVIDER_SITE_OTHER): Payer: 59 | Admitting: Orthopaedic Surgery

## 2021-07-21 ENCOUNTER — Encounter: Payer: Self-pay | Admitting: Orthopaedic Surgery

## 2021-07-21 ENCOUNTER — Other Ambulatory Visit: Payer: Self-pay

## 2021-07-21 DIAGNOSIS — Z9889 Other specified postprocedural states: Secondary | ICD-10-CM | POA: Diagnosis not present

## 2021-07-21 DIAGNOSIS — M1711 Unilateral primary osteoarthritis, right knee: Secondary | ICD-10-CM | POA: Diagnosis not present

## 2021-07-21 MED ORDER — DICLOFENAC SODIUM 75 MG PO TBEC: 75.0000 mg | DELAYED_RELEASE_TABLET | Freq: Two times a day (BID) | ORAL | 3 refills | Status: DC

## 2021-07-21 NOTE — Progress Notes (Signed)
HPI: [name] returns today for follow-up of her right knee status post injection with cortisone on 06/09/2021.  She states that the injection helped for about 4 weeks.  Now the pain is back to what it was prior to the injection.  She does note that stairs irritate knee.  The knee does swell at times.  She has had no new injury.  She is requesting refill on her oral diclofenac as this does help with her knee pain.  Postoperative she was sent to therapy to work on strengthening however she did not continue the exercises at home.  Physical exam: Right knee good range of motion.  No abnormal warmth erythema or effusion.  Positive patellofemoral crepitus.  Impression: Right knee status post knee arthroscopy with partial medial meniscectomy Chondromalacia medial compartment right knee  Plan: Discussed with her knee friendly exercises.  She shown some quad strengthening exercises she can do at home.  Information was given on Trivisc injection .  She will let us know if she would like to try this supplemental injection.  Refilled her oral diclofenac.  Follow-up as needed.  She understands she needs to wait at least 3 months between cortisone injections in her knee.

## 2021-07-31 ENCOUNTER — Other Ambulatory Visit: Payer: Self-pay

## 2021-07-31 ENCOUNTER — Encounter: Payer: Self-pay | Admitting: Hematology & Oncology

## 2021-07-31 ENCOUNTER — Inpatient Hospital Stay: Payer: 59 | Attending: Hematology & Oncology

## 2021-07-31 ENCOUNTER — Inpatient Hospital Stay (HOSPITAL_BASED_OUTPATIENT_CLINIC_OR_DEPARTMENT_OTHER): Payer: 59 | Admitting: Hematology & Oncology

## 2021-07-31 VITALS — BP 131/85 | HR 71 | Temp 97.9°F | Resp 20 | Wt 208.4 lb

## 2021-07-31 DIAGNOSIS — C9110 Chronic lymphocytic leukemia of B-cell type not having achieved remission: Secondary | ICD-10-CM | POA: Diagnosis present

## 2021-07-31 LAB — CBC WITH DIFFERENTIAL (CANCER CENTER ONLY)
Abs Immature Granulocytes: 0.03 10*3/uL (ref 0.00–0.07)
Basophils Absolute: 0.1 10*3/uL (ref 0.0–0.1)
Basophils Relative: 1 %
Eosinophils Absolute: 0.2 10*3/uL (ref 0.0–0.5)
Eosinophils Relative: 2 %
HCT: 41.7 % (ref 36.0–46.0)
Hemoglobin: 13.5 g/dL (ref 12.0–15.0)
Immature Granulocytes: 0 %
Lymphocytes Relative: 61 %
Lymphs Abs: 8.5 10*3/uL — ABNORMAL HIGH (ref 0.7–4.0)
MCH: 29.5 pg (ref 26.0–34.0)
MCHC: 32.4 g/dL (ref 30.0–36.0)
MCV: 91 fL (ref 80.0–100.0)
Monocytes Absolute: 0.4 10*3/uL (ref 0.1–1.0)
Monocytes Relative: 3 %
Neutro Abs: 4.6 10*3/uL (ref 1.7–7.7)
Neutrophils Relative %: 33 %
Platelet Count: 395 10*3/uL (ref 150–400)
RBC: 4.58 MIL/uL (ref 3.87–5.11)
RDW: 13.7 % (ref 11.5–15.5)
WBC Count: 13.9 10*3/uL — ABNORMAL HIGH (ref 4.0–10.5)
nRBC: 0 % (ref 0.0–0.2)

## 2021-07-31 LAB — CMP (CANCER CENTER ONLY)
ALT: 10 U/L (ref 0–44)
AST: 14 U/L — ABNORMAL LOW (ref 15–41)
Albumin: 4.5 g/dL (ref 3.5–5.0)
Alkaline Phosphatase: 59 U/L (ref 38–126)
Anion gap: 6 (ref 5–15)
BUN: 18 mg/dL (ref 6–20)
CO2: 29 mmol/L (ref 22–32)
Calcium: 9.5 mg/dL (ref 8.9–10.3)
Chloride: 105 mmol/L (ref 98–111)
Creatinine: 0.79 mg/dL (ref 0.44–1.00)
GFR, Estimated: >60 mL/min (ref >60)
Glucose, Bld: 87 mg/dL (ref 70–99)
Potassium: 4.2 mmol/L (ref 3.5–5.1)
Sodium: 140 mmol/L (ref 135–145)
Total Bilirubin: 0.3 mg/dL (ref 0.3–1.2)
Total Protein: 6.9 g/dL (ref 6.5–8.1)

## 2021-07-31 LAB — LACTATE DEHYDROGENASE: LDH: 146 U/L (ref 98–192)

## 2021-07-31 LAB — SAVE SMEAR(SSMR), FOR PROVIDER SLIDE REVIEW

## 2021-07-31 NOTE — Progress Notes (Signed)
Hematology and Oncology Follow Up Visit  MATEAH MULGREW [id number] 07-13-1961 60 y.o. 07/31/2021   Principle Diagnosis:  CLL - Stage A  Current Therapy:   Observation   Interim History:  Ms. Lynne is here today for follow-up.  We saw her 6 months ago.  She is doing okay.  She did have arthroscopic surgery for her right knee.  The knee is still a little bit stiff.  She is trying to work this out.  She is still working.  She has had no problems with infections since we last saw her.  There is been no swollen lymph nodes.  She has no issues with nausea or vomiting.  She we will begin a mammogram later on this year.  She has had no problems with rashes.  There is been no bleeding.  Thankfully, she has avoided COVID.  Currently, I would say performance status is ECOG 1.    Medications:  Allergies as of 07/31/2021       Reactions   Ampicillin Other (See Comments)   REACTION: seizures Did it involve swelling of the face/tongue/throat, SOB, or low BP? No Did it involve sudden or severe rash/hives, skin peeling, or any reaction on the inside of your mouth or nose? No Did you need to seek medical attention at a hospital or doctor's office? Yes When did it last happen?      36 yrs ago If all above answers are "NO", may proceed with cephalosporin use.   Percocet [oxycodone-acetaminophen] Nausea Only        Medication List        Accurate as of July 31, 2021  2:38 PM. If you have any questions, ask your nurse or doctor.          STOP taking these medications    diclofenac 75 MG EC tablet Commonly known as: VOLTAREN Stopped by: Volanda Napoleon, MD       TAKE these medications    HYDROcodone-acetaminophen 5-325 MG tablet Commonly known as: NORCO/VICODIN Take 1-2 tablets by mouth every 6 (six) hours as needed for moderate pain.   ondansetron 4 MG disintegrating tablet Commonly known as: Zofran ODT Take 1 tablet (4 mg total) by mouth every 8 (eight) hours as  needed for nausea or vomiting.   phentermine 37.5 MG tablet Commonly known as: ADIPEX-P Take 1 tablet (37.5 mg total) by mouth daily before breakfast.        Allergies:  Allergies  Allergen Reactions   Ampicillin Other (See Comments)    REACTION: seizures Did it involve swelling of the face/tongue/throat, SOB, or low BP? No Did it involve sudden or severe rash/hives, skin peeling, or any reaction on the inside of your mouth or nose? No Did you need to seek medical attention at a hospital or doctor's office? Yes When did it last happen?      36 yrs ago If all above answers are "NO", may proceed with cephalosporin use.   Percocet [Oxycodone-Acetaminophen] Nausea Only    Past Medical History, Surgical history, Social history, and Family History were reviewed and updated.  Review of Systems: Review of Systems  Constitutional: Negative.   HENT: Negative.    Eyes: Negative.   Respiratory: Negative.    Cardiovascular: Negative.   Gastrointestinal: Negative.   Genitourinary: Negative.   Musculoskeletal: Negative.   Skin: Negative.   Neurological: Negative.   Endo/Heme/Allergies: Negative.   Psychiatric/Behavioral: Negative.      Physical Exam:  weight is 208 lb 6.4 oz (  94.5 kg). Her oral temperature is 97.9 F (36.6 C). Her blood pressure is 131/85 and her pulse is 71. Her respiration is 20 and oxygen saturation is 100%.   Wt Readings from Last 3 Encounters:  07/31/21 208 lb 6.4 oz (94.5 kg)  03/12/21 206 lb 5.6 oz (93.6 kg)  02/04/21 210 lb (95.3 kg)   Physical Exam Vitals reviewed.  HENT:     Head: Normocephalic and atraumatic.  Eyes:     Pupils: Pupils are equal, round, and reactive to light.  Cardiovascular:     Rate and Rhythm: Normal rate and regular rhythm.     Heart sounds: Normal heart sounds.  Pulmonary:     Effort: Pulmonary effort is normal.     Breath sounds: Normal breath sounds.  Abdominal:     General: Bowel sounds are normal.     Palpations:  Abdomen is soft.  Musculoskeletal:        General: No tenderness or deformity. Normal range of motion.     Cervical back: Normal range of motion.  Lymphadenopathy:     Cervical: No cervical adenopathy.  Skin:    General: Skin is warm and dry.     Findings: No erythema or rash.  Neurological:     Mental Status: She is alert and oriented to person, place, and time.  Psychiatric:        Behavior: Behavior normal.        Thought Content: Thought content normal.        Judgment: Judgment normal.     Lab Results  Component Value Date   WBC 13.9 (H) 07/31/2021   HGB 13.5 07/31/2021   HCT 41.7 07/31/2021   MCV 91.0 07/31/2021   PLT 395 07/31/2021   No results found for: FERRITIN, IRON, TIBC, UIBC, IRONPCTSAT Lab Results  Component Value Date   RBC 4.58 07/31/2021   Lab Results  Component Value Date   KPAFRELGTCHN 18.3 08/07/2020   LAMBDASER 8.7 08/07/2020   KAPLAMBRATIO 2.10 (H) 08/07/2020   Lab Results  Component Value Date   IGGSERUM 988 08/07/2020   IGA 58 (L) 08/07/2020   IGMSERUM 33 08/07/2020   Lab Results  Component Value Date   TOTALPROTELP 6.8 08/07/2020   ALBUMINELP 4.3 08/07/2020   A1GS 0.1 08/07/2020   A2GS 0.6 08/07/2020   BETS 0.9 08/07/2020   GAMS 0.9 08/07/2020   MSPIKE Not Observed 08/07/2020     Chemistry      Component Value Date/Time   NA 140 07/31/2021 1321   NA 141 09/05/2018 1526   K 4.2 07/31/2021 1321   CL 105 07/31/2021 1321   CO2 29 07/31/2021 1321   BUN 18 07/31/2021 1321   BUN 13 09/05/2018 1526   CREATININE 0.79 07/31/2021 1321      Component Value Date/Time   CALCIUM 9.5 07/31/2021 1321   ALKPHOS 59 07/31/2021 1321   AST 14 (L) 07/31/2021 1321   ALT 10 07/31/2021 1321   BILITOT 0.3 07/31/2021 1321       Impression and Plan: Ms. Rasor is a very pleasant 60 yo African American female with stage A CLL.  Her blood count looks great.  Her white cell count is a little bit better.  In 2 years, there really has been no change in  her white cell count.  I think at this point, we get her back yearly.  I will see any issues with her coming back in 1 year.  If she has any problems between  now and 1 year, we will be more than happy to get her back.  I am just happy that her right knee is getting better and that she had surgery for this.    Volanda Napoleon, MD 9/16/20222:38 PM

## 2021-08-01 LAB — IGG, IGA, IGM
IgA: 47 mg/dL — ABNORMAL LOW (ref 87–352)
IgG (Immunoglobin G), Serum: 868 mg/dL (ref 586–1602)
IgM (Immunoglobulin M), Srm: 26 mg/dL (ref 26–217)

## 2021-08-07 ENCOUNTER — Other Ambulatory Visit: Payer: 59

## 2021-08-07 ENCOUNTER — Ambulatory Visit: Payer: 59 | Admitting: Hematology & Oncology

## 2021-09-07 ENCOUNTER — Other Ambulatory Visit: Payer: Self-pay

## 2021-09-07 ENCOUNTER — Ambulatory Visit (HOSPITAL_COMMUNITY)
Admission: EM | Admit: 2021-09-07 | Discharge: 2021-09-07 | Disposition: A | Discharge status: Home / Self Care | Payer: 59

## 2021-09-07 ENCOUNTER — Encounter (HOSPITAL_COMMUNITY): Payer: Self-pay | Admitting: Emergency Medicine

## 2021-09-07 DIAGNOSIS — M25512 Pain in left shoulder: Secondary | ICD-10-CM | POA: Diagnosis not present

## 2021-09-07 DIAGNOSIS — G8929 Other chronic pain: Secondary | ICD-10-CM

## 2021-09-07 NOTE — ED Triage Notes (Signed)
Pt presents with left arm/ shoulder pain xs 3 months. States unable to lift without pain. Taking aspirin and diclofenac with no relief.

## 2021-09-07 NOTE — ED Provider Notes (Signed)
[facility]    CSN: [id number] Arrival date & time: 09/07/21  0800      History   Chief Complaint Chief Complaint  Patient presents with   Arm Pain    HPI Melissa Harding is a 60 y.o. female.  She reports 3 months or more of left shoulder pain and decreased range of motion.  She sleeps on her left side with her left arm extended and her head on her shoulder.  She thinks this could be causing her symptoms and has tried sleeping in different positions but always wakes up the next morning back in her usual position on her left shoulder.  Denies any specific injury.  Does not experience pain unless she is doing specific range of motion movements.  Has been using aspirin and diclofenac with no improvement in symptoms.  Denies numbness, tingling, or weakness of left upper extremity.   Arm Pain   Past Medical History:  Diagnosis Date   Acute medial meniscus tear, right, subsequent encounter 03/11/2021   CLL (chronic lymphocytic leukemia) ([place]) 08/07/2020   Goals of care, counseling/discussion 08/07/2020   MMT (medial meniscus tear)    right    Patient Active Problem List   Diagnosis Date Noted   Primary osteoarthritis of right knee 07/21/2021   Acute medial meniscus tear, right, subsequent encounter 03/11/2021   CLL (chronic lymphocytic leukemia) ([place]) 08/07/2020   Goals of care, counseling/discussion 08/07/2020   Lymphocytosis 02/05/2019   MENOPAUSAL SYNDROME 03/05/2009    Past Surgical History:  Procedure Laterality Date   APPENDECTOMY     CESAREAN SECTION     2 previous   ECTOPIC PREGNANCY SURGERY     x2   KNEE ARTHROSCOPY Right 03/12/2021   Procedure: RIGHT KNEE ARTHROSCOPY WITH PARTIAL MEDIAL MENISCECTOMY;  Surgeon: Mcarthur Rossetti, MD;  Location: [facility];  Service: Orthopedics;  Laterality: Right;   LAPAROSCOPIC TUBAL LIGATION     TUBAL LIGATION      OB History     Gravida  6   Para      Term      Preterm      AB  4    Living  2      SAB      IAB  2   Ectopic  2   Multiple      Live Births  2            Home Medications    Prior to Admission medications   Medication Sig Start Date End Date Taking? Authorizing Provider  diclofenac (VOLTAREN) 75 MG EC tablet Take 75 mg by mouth 2 (two) times daily. 08/03/21   [provider]  HYDROcodone-acetaminophen (NORCO/VICODIN) 5-325 MG tablet Take 1-2 tablets by mouth every 6 (six) hours as needed for moderate pain. 03/12/21 03/12/22  Mcarthur Rossetti, MD  ondansetron (ZOFRAN ODT) 4 MG disintegrating tablet Take 1 tablet (4 mg total) by mouth every 8 (eight) hours as needed for nausea or vomiting. 03/12/21   Mcarthur Rossetti, MD  phentermine (ADIPEX-P) 37.5 MG tablet Take 1 tablet (37.5 mg total) by mouth daily before breakfast. 01/20/21 02/19/21  Gildardo Pounds, NP    Family History Family History  Problem Relation Age of Onset   Hypertension Mother    Hypertension Father    Cancer Paternal Grandmother    Stomach cancer Paternal Grandmother    Heart attack Paternal Grandfather    Colon cancer Neg Hx    Colon polyps  Neg Hx    Esophageal cancer Neg Hx    Rectal cancer Neg Hx     Social History Social History   Tobacco Use   Smoking status: Former    Types: Cigarettes    Quit date: 2000    Years since quitting: 22.8   Smokeless tobacco: Never  Vaping Use   Vaping Use: Never used  Substance Use Topics   Alcohol use: Yes    Alcohol/week: 1.0 standard drink    Types: 1 Glasses of wine per week    Comment: social   Drug use: No     Allergies   Ampicillin and Percocet [oxycodone-acetaminophen]   Review of Systems Review of Systems   Physical Exam Triage Vital Signs ED Triage Vitals  Enc Vitals Group     BP 09/07/21 0823 131/84     Pulse Rate 09/07/21 0823 69     Resp 09/07/21 0823 16     Temp 09/07/21 0823 98.7 F (37.1 C)     Temp Source 09/07/21 0823 Oral     SpO2 09/07/21 0823 98 %     Weight --       Height --      Head Circumference --      Peak Flow --      Pain Score 09/07/21 0821 0     Pain Loc --      Pain Edu? --      Excl. in [place]? --    No data found.  Updated Vital Signs BP 131/84 (BP Location: Right Arm)   Pulse 69   Temp 98.7 F (37.1 C) (Oral)   Resp 16   SpO2 98%   Visual Acuity Right Eye Distance:   Left Eye Distance:   Bilateral Distance:    Right Eye Near:   Left Eye Near:    Bilateral Near:     Physical Exam Constitutional:      Appearance: Normal appearance. She is obese.  Musculoskeletal:     Left shoulder: No swelling, deformity, tenderness or bony tenderness. Decreased range of motion. Normal strength.     Cervical back: No deformity, tenderness or bony tenderness.     Comments: Left shoulder range of motion: Has limits in flexion, extension, external rotation, and abduction   Neurological:     Mental Status: She is alert.     UC Treatments / Results  Labs (all labs ordered are listed, but only abnormal results are displayed) Labs Reviewed - No data to display  EKG   Radiology No results found.  Procedures Procedures (including critical care time)  Medications Ordered in UC Medications - No data to display  Initial Impression / Assessment and Plan / UC Course  I have reviewed the triage vital signs and the nursing notes.  Pertinent labs & imaging results that were available during my care of the patient were reviewed by me and considered in my medical decision making (see chart for details).  Patient has an orthopedist (Dr. Ninfa Linden) that she sees for her knee.  I advised her to follow-up with Dr. Ninfa Linden or one of his colleagues about her shoulder. Ok to continue to use ASA and diclofenac cream for pain.     Final Clinical Impressions(s) / UC Diagnoses   Final diagnoses:  Chronic left shoulder pain     Discharge Instructions      Follow up with your orthopedic office - it might not be Dr. Ninfa Linden that you need to  see but someone  else in his office that specializes in shoulders.    ED Prescriptions   None    PDMP not reviewed this encounter.   Carvel Getting, NP 09/07/21 [phone]

## 2021-09-07 NOTE — Discharge Instructions (Addendum)
Follow up with your orthopedic office - it might not be Dr. Ninfa Linden that you need to see but someone else in his office that specializes in shoulders.

## 2021-09-16 ENCOUNTER — Other Ambulatory Visit: Payer: Self-pay

## 2021-09-16 ENCOUNTER — Encounter: Payer: Self-pay | Admitting: Orthopaedic Surgery

## 2021-09-16 ENCOUNTER — Ambulatory Visit: Payer: Self-pay

## 2021-09-16 ENCOUNTER — Ambulatory Visit (INDEPENDENT_AMBULATORY_CARE_PROVIDER_SITE_OTHER): Payer: 59 | Admitting: Orthopaedic Surgery

## 2021-09-16 DIAGNOSIS — M25512 Pain in left shoulder: Secondary | ICD-10-CM | POA: Diagnosis not present

## 2021-09-16 DIAGNOSIS — M7542 Impingement syndrome of left shoulder: Secondary | ICD-10-CM

## 2021-09-16 MED ORDER — METHYLPREDNISOLONE ACETATE 40 MG/ML IJ SUSP
40.0000 mg | INTRAMUSCULAR | Status: AC | PRN
  Administered 2021-09-16: 40 mg via INTRA_ARTICULAR

## 2021-09-16 MED ORDER — METHYLPREDNISOLONE 4 MG PO TABS: ORAL_TABLET | ORAL | 0 refills | Status: DC

## 2021-09-16 MED ORDER — LIDOCAINE HCL 1 % IJ SOLN
3.0000 mL | INTRAMUSCULAR | Status: AC | PRN
  Administered 2021-09-16: 3 mL

## 2021-09-16 MED ORDER — TIZANIDINE HCL 4 MG PO TABS: 4.0000 mg | ORAL_TABLET | Freq: Three times a day (TID) | ORAL | 1 refills | Status: DC | PRN

## 2021-09-16 MED ORDER — DICLOFENAC SODIUM 75 MG PO TBEC: 75.0000 mg | DELAYED_RELEASE_TABLET | Freq: Two times a day (BID) | ORAL | 1 refills | Status: DC | PRN

## 2021-09-16 NOTE — Progress Notes (Signed)
Office Visit Note   Patient: Melissa Harding           Date of Birth: 03-Jun-1961           MRN: [id number] Visit Date: 09/16/2021              Requested by: Gildardo Pounds, NP [place],  [place] [address] PCP: Gildardo Pounds, NP   Assessment & Plan: Visit Diagnoses:  1. Acute pain of left shoulder   2. Impingement syndrome of left shoulder     Plan: Her signs and symptoms are classic for shoulder impingement syndrome of the left shoulder.  I did recommend a steroid injection in the subacromial outlet and have recommended outpatient physical therapy for her shoulder as well and she agrees to this.  She did tolerate the steroid injection well.  I explained the risk and benefits of steroid injection.  I will also put her on a combination of a steroid taper orally combined with diclofenac and Zanaflex.  All question concerns were answered addressed.  We will see her back in 4 weeks after course of therapy and to see how the medical aspect of her treatment is done for her as well.  Follow-Up Instructions: Return in about 4 weeks (around 10/14/2021).   Orders:  Orders Placed This Encounter  Procedures   Large Joint Inj   XR Shoulder Left   Meds ordered this encounter  Medications   diclofenac (VOLTAREN) 75 MG EC tablet    Sig: Take 1 tablet (75 mg total) by mouth 2 (two) times daily between meals as needed.    Dispense:  60 tablet    Refill:  1   tiZANidine (ZANAFLEX) 4 MG tablet    Sig: Take 1 tablet (4 mg total) by mouth every 8 (eight) hours as needed for muscle spasms.    Dispense:  40 tablet    Refill:  1   methylPREDNISolone (MEDROL) 4 MG tablet    Sig: Medrol dose pack. Take as instructed    Dispense:  21 tablet    Refill:  0      Procedures: Large Joint Inj: L subacromial bursa on 09/16/2021 9:39 AM Indications: pain and diagnostic evaluation Details: 22 G 1.5 in needle  Arthrogram: No  Medications: 3 mL lidocaine 1 %; 40 mg methylPREDNISolone  acetate 40 MG/ML Outcome: tolerated well, no immediate complications Procedure, treatment alternatives, risks and benefits explained, specific risks discussed. Consent was given by the patient. Immediately prior to procedure a time out was called to verify the correct patient, procedure, equipment, support staff and site/side marked as required. Patient was prepped and draped in the usual sterile fashion.      Clinical Data: No additional findings.   Subjective: Chief Complaint  Patient presents with   Left Shoulder - Pain  The patient is an established patient of the office who comes in with a new issue.  She has been dealing with left shoulder pain for about 3 months now with no known injury.  She is only 60 years old.  She is not a diabetic.  Her husband is with her and states that he thinks it is related to how she sleeps at night and she does sleep with her arm above her head without left shoulder.  She says reaching behind her and overhead is become more painful with time.  She does work in [employer].  She denies any numbness and tingling but does have some left-sided neck  pain.  HPI  Review of Systems There is currently no headache, chest pain, shortness of breath, fever, chills, nausea, vomiting  Objective: Vital Signs: There were no vitals taken for this visit.  Physical Exam She is alert and orient x3 and in no acute distress Ortho Exam Examination of her left shoulder shows positive Neer and Hawkins signs and signs of impingement.  Her liftoff is negative and she does use her rotator cuff comfortably abduct the shoulder but hurts quite significantly in the subacromial outlet past 90 degrees of abduction.  There is stiffness with lateral rotation of the neck and lateral bending but no neurologic deficits in the left upper extremity. Specialty Comments:  No specialty comments available.  Imaging: XR Shoulder Left  Result Date: 09/16/2021 3 views left shoulder show no  acute findings.    PMFS History: Patient Active Problem List   Diagnosis Date Noted   Primary osteoarthritis of right knee 07/21/2021   Acute medial meniscus tear, right, subsequent encounter 03/11/2021   CLL (chronic lymphocytic leukemia) ([facility]) 08/07/2020   Goals of care, counseling/discussion 08/07/2020   Lymphocytosis 02/05/2019   MENOPAUSAL SYNDROME 03/05/2009   Past Medical History:  Diagnosis Date   Acute medial meniscus tear, right, subsequent encounter 03/11/2021   CLL (chronic lymphocytic leukemia) ([facility]) 08/07/2020   Goals of care, counseling/discussion 08/07/2020   MMT (medial meniscus tear)    right    Family History  Problem Relation Age of Onset   Hypertension Mother    Hypertension Father    Cancer Paternal Grandmother    Stomach cancer Paternal Grandmother    Heart attack Paternal Grandfather    Colon cancer Neg Hx    Colon polyps Neg Hx    Esophageal cancer Neg Hx    Rectal cancer Neg Hx     Past Surgical History:  Procedure Laterality Date   APPENDECTOMY     CESAREAN SECTION     2 previous   ECTOPIC PREGNANCY SURGERY     x2   KNEE ARTHROSCOPY Right 03/12/2021   Procedure: RIGHT KNEE ARTHROSCOPY WITH PARTIAL MEDIAL MENISCECTOMY;  Surgeon: Mcarthur Rossetti, MD;  Location: [facility];  Service: Orthopedics;  Laterality: Right;   LAPAROSCOPIC TUBAL LIGATION     TUBAL LIGATION     Social History   Occupational History   Occupation: part time    Comment: self employed  Tobacco Use   Smoking status: Former    Types: Cigarettes    Quit date: 2000    Years since quitting: 22.8   Smokeless tobacco: Never  Vaping Use   Vaping Use: Never used  Substance and Sexual Activity   Alcohol use: Yes    Alcohol/week: 1.0 standard drink    Types: 1 Glasses of wine per week    Comment: social   Drug use: No   Sexual activity: Yes    Birth control/protection: Surgical    Comment: BTL

## 2021-09-17 NOTE — Patient Instructions (Incomplete)
Thank you for choosing [facility] for your Primary Care needs. I am excited for the opportunity to partner with you to meet your health care goals. It was a pleasure meeting you today! ° °Recommendations from today's visit: °*** ° °Information on diet, exercise, and health maintenance recommendations are listed below. This is information to help you be sure you are on track for optimal health and monitoring.  ° °Please look over this and let us know if you have any questions or if you have completed any of the health maintenance outside of [facility] so that we can be sure your records are up to date.  °___________________________________________________________ °About Me: °I am an Adult-Geriatric Nurse Practitioner with a background in caring for patients for more than 20 years with a strong intensive care background. I provide primary care and sports medicine services to patients age 58 and older within this office. My education had a strong focus on caring for the older adult population, which I am passionate about. I am also the director of the [employer] with [employer].  ° °My desire is to provide you with the best service through preventive medicine and supportive care. I consider you a part of the medical team and value your input. I work diligently to ensure that you are heard and your needs are met in a safe and effective manner. I want you to feel comfortable with me as your provider and want you to know that your health concerns are important to me. ° °For your information, our office hours are: °Monday, Tuesday, and Thursday 8:00 AM - 5:00 PM °Wednesday and Friday 8:00 AM - 12:00 PM.  ° °In my time away from the office I am teaching new APP's within the system and am unavailable, but my partner, Dr. deCuba is in the office for emergent needs.  ° °If you have questions or concerns, please call our office at [phone] or send us a MyChart message and we will  respond as quickly as possible.  °____________________________________________________________ °MyChart:  °For all urgent or time sensitive needs we ask that you please call the office to avoid delays. Our number is [phone]. °MyChart is not constantly monitored and due to the large volume of messages a day, replies may take up to 72 business hours. ° °MyChart Policy: °MyChart allows for you to see your visit notes, after visit summary, provider recommendations, lab and tests results, make an appointment, request refills, and contact your provider or the office for non-urgent questions or concerns. Providers are seeing patients during normal business hours and do not have built in time to review MyChart messages.  °We ask that you allow a minimum of 3 business days for responses to MyChart messages. For this reason, please do not send urgent requests through MyChart. Please call the office at [phone]. °New and ongoing conditions may require a visit. We have virtual and in person visit available for your convenience.  °Complex MyChart concerns may require a visit. Your provider may request you schedule a virtual or in person visit to ensure we are providing the best care possible. °MyChart messages sent after 11:00 AM on Friday will not be received by the provider until Monday morning.  °  °Lab and Test Results: °You will receive your lab and test results on MyChart as soon as they are completed and results have been sent by the lab or testing facility. Due to this service, you will receive your results   BEFORE your provider.  I review lab and tests results each morning prior to seeing patients. Some results require collaboration with other providers to ensure you are receiving the most appropriate care. For this reason, we ask that you please allow a minimum of 3-5 business days from the time the ALL results have been received for your provider to receive and review lab and test results and contact you  about these.  Most lab and test result comments from the provider will be sent through [facility]. Your provider may recommend changes to the plan of care, follow-up visits, repeat testing, ask questions, or request an office visit to discuss these results. You may reply directly to this message or call the office at [phone] to provide information for the provider or set up an appointment. In some instances, you will be called with test results and recommendations. Please let us know if this is preferred and we will make note of this in your chart to provide this for you.    If you have not heard a response to your lab or test results in 5 business days from all results returning to [facility], please call the office to let us know. We ask that you please avoid calling prior to this time unless there is an emergent concern. Due to high call volumes, this can delay the resulting process.  After Hours: For all non-emergency after hours needs, please call the office at [phone] and select the option to reach the on-call provider service. On-call services are shared between multiple [facility] offices and therefore it will not be possible to speak directly with your provider. On-call providers may provide medical advice and recommendations, but are unable to provide refills for maintenance medications.  For all emergency or urgent medical needs after normal business hours, we recommend that you seek care at the closest Urgent Care or Emergency Department to ensure appropriate treatment in a timely manner.  [facility] has a 24 hour emergency room located on the ground floor for your convenience.   Urgent Concerns During the Business Day Providers are seeing patients from 8AM to [place] with a busy schedule and are most often not able to respond to non-urgent calls until the end of the day or the next business day. If you should have URGENT concerns during the day, please call and speak  to the nurse or schedule a same day appointment so that we can address your concern without delay.   Thank you, again, for choosing me as your health care partner. I appreciate your trust and look forward to learning more about you.   Worthy Keeler, DNP, AGNP-c ___________________________________________________________  Health Maintenance Recommendations Screening Testing Mammogram Every 1 -2 years based on history and risk factors Starting at age 110 Pap Smear Ages 21-39 every 3 years Ages 55-65 every 5 years with HPV testing More frequent testing may be required based on results and history Colon Cancer Screening Every 1-10 years based on test performed, risk factors, and history Starting at age 6 Bone Density Screening Every 2-10 years based on history Starting at age 38 for women Recommendations for men differ based on medication usage, history, and risk factors AAA Screening One time ultrasound Men 80-19 years old who have every smoked Lung Cancer Screening Low Dose Lung CT every 12 months Age 57-80 years with a 30 pack-year smoking history who still smoke or who have quit within the last 15 years  Screening Labs Routine  Labs: Complete  Blood Count (CBC), Complete Metabolic Panel (CMP), Cholesterol (Lipid Panel) Every 6-12 months based on history and medications May be recommended more frequently based on current conditions or previous results Hemoglobin A1c Lab Every 3-12 months based on history and previous results Starting at age 66 or earlier with diagnosis of diabetes, high cholesterol, BMI >26, and/or risk factors Frequent monitoring for patients with diabetes to ensure blood sugar control Thyroid Panel (TSH w/ T3 & T4) Every 6 months based on history, symptoms, and risk factors May be repeated more often if on medication HIV One time testing for all patients 58 and older May be repeated more frequently for patients with increased risk factors or  exposure Hepatitis C One time testing for all patients 98 and older May be repeated more frequently for patients with increased risk factors or exposure Gonorrhea, Chlamydia Every 12 months for all sexually active persons 13-24 years Additional monitoring may be recommended for those who are considered high risk or who have symptoms PSA Men 28-73 years old with risk factors Additional screening may be recommended from age 2-69 based on risk factors, symptoms, and history  Vaccine Recommendations Tetanus Booster All adults every 10 years Flu Vaccine All patients 6 months and older every year COVID Vaccine All patients 12 years and older Initial dosing with booster May recommend additional booster based on age and health history HPV Vaccine 2 doses all patients age 92-26 Dosing may be considered for patients over 26 Shingles Vaccine (Shingrix) 2 doses all adults 74 years and older Pneumonia (Pneumovax 23) All adults 48 years and older May recommend earlier dosing based on health history Pneumonia (Prevnar 57) All adults 78 years and older Dosed 1 year after Pneumovax 23  Additional Screening, Testing, and Vaccinations may be recommended on an individualized basis based on family history, health history, risk factors, and/or exposure.  __________________________________________________________  Diet Recommendations for All Patients  I recommend that all patients maintain a diet low in saturated fats, carbohydrates, and cholesterol. While this can be challenging at first, it is not impossible and small changes can make big differences.  Things to try: Decreasing the amount of soda, sweet tea, and/or juice to one or less per day and replace with water While water is always the first choice, if you do not like water you may consider adding a water additive without sugar to improve the taste other sugar free drinks Replace potatoes with a brightly colored vegetable at dinner Use  healthy oils, such as canola oil or olive oil, instead of butter or hard margarine Limit your bread intake to two pieces or less a day Replace regular pasta with low carb pasta options Bake, broil, or grill foods instead of frying Monitor portion sizes  Eat smaller, more frequent meals throughout the day instead of large meals  An important thing to remember is, if you love foods that are not great for your health, you don't have to give them up completely. Instead, allow these foods to be a reward when you have done well. Allowing yourself to still have special treats every once in a while is a nice way to tell yourself thank you for working hard to keep yourself healthy.   Also remember that every day is a new day. If you have a bad day and "fall off the wagon", you can still climb right back up and keep moving along on your journey!  We have resources available to help you!  Some websites that may be helpful  include: www.http://carter.biz/  Www.VeryWellFit.com _____________________________________________________________  Activity Recommendations for All Patients  I recommend that all adults get at least 20 minutes of moderate physical activity that elevates your heart rate at least 5 days out of the week.  Some examples include: Walking or jogging at a pace that allows you to carry on a conversation Cycling (stationary bike or outdoors) Water aerobics Yoga Weight lifting Dancing If physical limitations prevent you from putting stress on your joints, exercise in a pool or seated in a chair are excellent options.  Do determine your MAXIMUM heart rate for activity: YOUR AGE - 220 = MAX HeartRate   Remember! Do not push yourself too hard.  Start slowly and build up your pace, speed, weight, time in exercise, etc.  Allow your body to rest between exercise and get good sleep. You will need more water than normal when you are exerting yourself. Do not wait until you are thirsty to drink.  Drink with a purpose of getting in at least 8, 8 ounce glasses of water a day plus more depending on how much you exercise and sweat.    If you begin to develop dizziness, chest pain, abdominal pain, jaw pain, shortness of breath, headache, vision changes, lightheadedness, or other concerning symptoms, stop the activity and allow your body to rest. If your symptoms are severe, seek emergency evaluation immediately. If your symptoms are concerning, but not severe, please let us know so that we can recommend further evaluation.

## 2021-09-17 NOTE — Progress Notes (Deleted)
[name], DNP, AGNP-c Primary Care & Sports Medicine [address]  [place], [place] [address] [phone]  New patient visit   Patient: Melissa Harding   DOB: December 25, 1960   60 y.o. Female  MRN: [id number] Visit Date: 09/18/2021  Patient Care Team: Gildardo Pounds, NP as PCP - General (Nurse Practitioner)  Today's healthcare provider: [name], NP   No chief complaint on file.  Subjective    Melissa Harding is a 60 y.o. female who presents today as a new patient to establish care.  HPI  ***  Past Medical History:  Diagnosis Date   Acute medial meniscus tear, right, subsequent encounter 03/11/2021   CLL (chronic lymphocytic leukemia) ([facility]) 08/07/2020   Goals of care, counseling/discussion 08/07/2020   MMT (medial meniscus tear)    right   Past Surgical History:  Procedure Laterality Date   APPENDECTOMY     CESAREAN SECTION     2 previous   ECTOPIC PREGNANCY SURGERY     x2   KNEE ARTHROSCOPY Right 03/12/2021   Procedure: RIGHT KNEE ARTHROSCOPY WITH PARTIAL MEDIAL MENISCECTOMY;  Surgeon: Mcarthur Rossetti, MD;  Location: [facility];  Service: Orthopedics;  Laterality: Right;   LAPAROSCOPIC TUBAL LIGATION     TUBAL LIGATION     Family Status  Relation Name Status   Mother  Deceased   Father  Alive   Brother  Alive   MGM  Deceased   MGF  Deceased   PGM  Deceased   PGF  Deceased   Neg Hx  (Not Specified)   Family History  Problem Relation Age of Onset   Hypertension Mother    Hypertension Father    Cancer Paternal Grandmother    Stomach cancer Paternal Grandmother    Heart attack Paternal Grandfather    Colon cancer Neg Hx    Colon polyps Neg Hx    Esophageal cancer Neg Hx    Rectal cancer Neg Hx    Social History   Socioeconomic History   Marital status: Married    Spouse name: steven   Number of children: 2   Years of education: 12   Highest education level: Not on file  Occupational  History   Occupation: part time    Comment: self employed  Tobacco Use   Smoking status: Former    Types: Cigarettes    Quit date: 2000    Years since quitting: 22.8   Smokeless tobacco: Never  Vaping Use   Vaping Use: Never used  Substance and Sexual Activity   Alcohol use: Yes    Alcohol/week: 1.0 standard drink    Types: 1 Glasses of wine per week    Comment: social   Drug use: No   Sexual activity: Yes    Birth control/protection: Surgical    Comment: BTL  Other Topics Concern   Not on file  Social History Narrative   Not on file   Social Determinants of Health   Financial Resource Strain: Not on file  Food Insecurity: Not on file  Transportation Needs: Not on file  Physical Activity: Not on file  Stress: Not on file  Social Connections: Not on file   Outpatient Medications Prior to Visit  Medication Sig   diclofenac (VOLTAREN) 75 MG EC tablet Take 75 mg by mouth 2 (two) times daily.   diclofenac (VOLTAREN) 75 MG EC tablet Take 1 tablet (75 mg total) by mouth 2 (two) times daily  between meals as needed.   HYDROcodone-acetaminophen (NORCO/VICODIN) 5-325 MG tablet Take 1-2 tablets by mouth every 6 (six) hours as needed for moderate pain.   methylPREDNISolone (MEDROL) 4 MG tablet Medrol dose pack. Take as instructed   ondansetron (ZOFRAN ODT) 4 MG disintegrating tablet Take 1 tablet (4 mg total) by mouth every 8 (eight) hours as needed for nausea or vomiting.   phentermine (ADIPEX-P) 37.5 MG tablet Take 1 tablet (37.5 mg total) by mouth daily before breakfast.   tiZANidine (ZANAFLEX) 4 MG tablet Take 1 tablet (4 mg total) by mouth every 8 (eight) hours as needed for muscle spasms.   No facility-administered medications prior to visit.   Allergies  Allergen Reactions   Ampicillin Other (See Comments)    REACTION: seizures Did it involve swelling of the face/tongue/throat, SOB, or low BP? No Did it involve sudden or severe rash/hives, skin peeling, or any reaction on  the inside of your mouth or nose? No Did you need to seek medical attention at a hospital or doctor's office? Yes When did it last happen?      36 yrs ago If all above answers are "NO", may proceed with cephalosporin use.   Percocet [Oxycodone-Acetaminophen] Nausea Only    Immunization History  Administered Date(s) Administered   PFIZER(Purple Top)SARS-COV-2 Vaccination 02/22/2020, 03/17/2020   PPD Test 05/08/2019   Td 12/16/2006    Health Maintenance  Topic Date Due   Pneumococcal Vaccine 41-62 Years old (1 - PCV) Never done   HIV Screening  Never done   Zoster Vaccines- Shingrix (1 of 2) Never done   COVID-19 Vaccine (3 - Pfizer risk series) 04/14/2020   PAP SMEAR-Modifier  10/18/2020   INFLUENZA VACCINE  06/15/2021   TETANUS/TDAP  07/18/2021   MAMMOGRAM  03/05/2023   COLONOSCOPY (Pts 45-53yrs Insurance coverage will need to be confirmed)  11/04/2027   Hepatitis C Screening  Completed   HPV VACCINES  Aged Out    Patient Care Team: Gildardo Pounds, NP as PCP - General (Nurse Practitioner)  Review of Systems All review of systems negative except what is listed in the HPI    Objective    There were no vitals taken for this visit. Physical Exam ***  Depression Screen PHQ 2/9 Scores 12/06/2018 09/05/2018  PHQ - 2 Score 0 0  PHQ- 9 Score 0 -   No results found for any visits on 09/18/21.  Assessment & Plan      Problem List Items Addressed This Visit   None    No follow-ups on file.      Chalon Zobrist, Coralee Pesa, NP, DNP, AGNP-C Primary Care & Sports Medicine at [facility]

## 2021-09-18 ENCOUNTER — Encounter (HOSPITAL_BASED_OUTPATIENT_CLINIC_OR_DEPARTMENT_OTHER): Payer: Self-pay

## 2021-09-18 ENCOUNTER — Ambulatory Visit (HOSPITAL_BASED_OUTPATIENT_CLINIC_OR_DEPARTMENT_OTHER): Payer: 59 | Admitting: Nurse Practitioner

## 2021-09-28 ENCOUNTER — Ambulatory Visit: Payer: 59 | Admitting: Physical Therapy

## 2021-10-14 ENCOUNTER — Ambulatory Visit: Payer: 59 | Admitting: Orthopaedic Surgery

## 2021-10-14 ENCOUNTER — Telehealth: Payer: Self-pay | Admitting: Orthopaedic Surgery

## 2021-10-14 NOTE — Telephone Encounter (Signed)
Pt called and states that she will not be able to go to physical therapy. Her co-pay is 100.00 and she just financially can't afford it. She would like a call so she can talk about where to go from here.

## 2021-10-14 NOTE — Telephone Encounter (Signed)
Patient aware of the below message  

## 2021-10-22 ENCOUNTER — Other Ambulatory Visit: Payer: Self-pay

## 2021-10-22 ENCOUNTER — Encounter (HOSPITAL_BASED_OUTPATIENT_CLINIC_OR_DEPARTMENT_OTHER): Payer: Self-pay | Admitting: Nurse Practitioner

## 2021-10-22 ENCOUNTER — Ambulatory Visit (INDEPENDENT_AMBULATORY_CARE_PROVIDER_SITE_OTHER): Payer: 59 | Admitting: Nurse Practitioner

## 2021-10-22 VITALS — BP 128/82 | HR 78 | Ht 62.0 in | Wt 206.2 lb

## 2021-10-22 DIAGNOSIS — Z23 Encounter for immunization: Secondary | ICD-10-CM | POA: Diagnosis not present

## 2021-10-22 DIAGNOSIS — M25512 Pain in left shoulder: Secondary | ICD-10-CM | POA: Diagnosis not present

## 2021-10-22 DIAGNOSIS — G8929 Other chronic pain: Secondary | ICD-10-CM

## 2021-10-22 MED ORDER — TRAMADOL HCL 50 MG PO TABS: 50.0000 mg | ORAL_TABLET | Freq: Four times a day (QID) | ORAL | 0 refills | Status: AC | PRN

## 2021-10-22 NOTE — Progress Notes (Signed)
Melissa Render, DNP, AGNP-c Primary Care & Sports Medicine [address]  [place], [place] [address] [phone]  New patient visit   Patient: Melissa Harding   DOB: 12-Mar-1961   60 y.o. Female  MRN: [id number] Visit Date: 10/22/2021  Patient Care Team: Michell Kader, Coralee Pesa, NP as PCP - General (Nurse Practitioner)  Today's healthcare provider: [name], NP   Chief Complaint  Patient presents with   New Patient (Initial Visit)    Patient is here to establish care. She would like to have a flu shot today. She would like to come back for PAP at a later date. Only concern is she cant bend her left arm x 1 Year.     Subjective    HPI  Melissa Harding is a 60 y.o. female who presents today as a new patient to establish care.    Melissa Harding reports pain in her left shoulder that has been present for about the past year, but is progressively worsening She has been seen by chiropractic care for this and x-rays have been taken which reportedly did not show any concerning findings.  She endorses pain with abduction of the shoulder and inability to lift the shoulder beyond 90 degrees without severe pain.  She tells me that she typically sleeps on this shoulder. She has no known injury to the shoulder.  She is able to lift with the arm and denies pain with lifting as long as the arm is held proximally to the body.  She has no erythema or ecchymosis present.   Past Medical History:  Diagnosis Date   Acute medial meniscus tear, right, subsequent encounter 03/11/2021   CLL (chronic lymphocytic leukemia) ([facility]) 08/07/2020   Goals of care, counseling/discussion 08/07/2020   MMT (medial meniscus tear)    right   Past Surgical History:  Procedure Laterality Date   APPENDECTOMY     CESAREAN SECTION     2 previous   ECTOPIC PREGNANCY SURGERY     x2   KNEE ARTHROSCOPY Right 03/12/2021   Procedure: RIGHT KNEE ARTHROSCOPY WITH PARTIAL MEDIAL MENISCECTOMY;  Surgeon:  Mcarthur Rossetti, MD;  Location: [facility];  Service: Orthopedics;  Laterality: Right;   LAPAROSCOPIC TUBAL LIGATION     TUBAL LIGATION     Family Status  Relation Name Status   Mother  Deceased   Father  Alive   Brother  Alive   MGM  Deceased   MGF  Deceased   PGM  Deceased   PGF  Deceased   Neg Hx  (Not Specified)   Family History  Problem Relation Age of Onset   Hypertension Mother    Hypertension Father    Cancer Paternal Grandmother    Stomach cancer Paternal Grandmother    Heart attack Paternal Grandfather    Colon cancer Neg Hx    Colon polyps Neg Hx    Esophageal cancer Neg Hx    Rectal cancer Neg Hx    Social History   Socioeconomic History   Marital status: Married    Spouse name: steven   Number of children: 2   Years of education: 12   Highest education level: Not on file  Occupational History   Occupation: part time    Comment: self employed  Tobacco Use   Smoking status: Former    Types: Cigarettes    Quit date: 2000    Years since quitting: 22.9   Smokeless tobacco: Never  Vaping Use   Vaping Use: Never used  Substance and Sexual Activity   Alcohol use: Yes    Alcohol/week: 1.0 standard drink    Types: 1 Glasses of wine per week    Comment: social   Drug use: No   Sexual activity: Yes    Birth control/protection: Surgical    Comment: BTL  Other Topics Concern   Not on file  Social History Narrative   Not on file   Social Determinants of Health   Financial Resource Strain: Not on file  Food Insecurity: Not on file  Transportation Needs: Not on file  Physical Activity: Not on file  Stress: Not on file  Social Connections: Not on file   Outpatient Medications Prior to Visit  Medication Sig   diclofenac (VOLTAREN) 75 MG EC tablet Take 75 mg by mouth 2 (two) times daily.   diclofenac (VOLTAREN) 75 MG EC tablet Take 1 tablet (75 mg total) by mouth 2 (two) times daily between meals as needed.   methylPREDNISolone  (MEDROL) 4 MG tablet Medrol dose pack. Take as instructed   tiZANidine (ZANAFLEX) 4 MG tablet Take 1 tablet (4 mg total) by mouth every 8 (eight) hours as needed for muscle spasms.   No facility-administered medications prior to visit.   Allergies  Allergen Reactions   Ampicillin Other (See Comments)    REACTION: seizures Did it involve swelling of the face/tongue/throat, SOB, or low BP? No Did it involve sudden or severe rash/hives, skin peeling, or any reaction on the inside of your mouth or nose? No Did you need to seek medical attention at a hospital or doctor's office? Yes When did it last happen?      36 yrs ago If all above answers are "NO", may proceed with cephalosporin use.   Percocet [Oxycodone-Acetaminophen] Nausea Only    Immunization History  Administered Date(s) Administered   Influenza,inj,Quad PF,6+ Mos 11/06/2019, 10/22/2021   PFIZER(Purple Top)SARS-COV-2 Vaccination 02/22/2020, 03/17/2020   PPD Test 05/08/2019   Td 12/16/2006    Health Maintenance  Topic Date Due   Pneumococcal Vaccine 44-44 Years old (1 - PCV) Never done   HIV Screening  Never done   Zoster Vaccines- Shingrix (1 of 2) Never done   COVID-19 Vaccine (3 - Pfizer risk series) 04/14/2020   PAP SMEAR-Modifier  10/18/2020   TETANUS/TDAP  07/18/2021   MAMMOGRAM  03/05/2023   COLONOSCOPY (Pts 45-71yrs Insurance coverage will need to be confirmed)  11/04/2027   INFLUENZA VACCINE  Completed   Hepatitis C Screening  Completed   HPV VACCINES  Aged Out    Patient Care Team: Dez Stauffer, Coralee Pesa, NP as PCP - General (Nurse Practitioner)  Review of Systems All review of systems negative except what is listed in the HPI   Objective    BP 128/82   Pulse 78   Ht 5\' 2"  (1.575 m)   Wt 206 lb 3.2 oz (93.5 kg)   SpO2 97%   BMI 37.71 kg/m  Physical Exam Vitals and nursing note reviewed.  Constitutional:      General: She is not in acute distress.    Appearance: Normal appearance.  Eyes:      Extraocular Movements: Extraocular movements intact.     Conjunctiva/sclera: Conjunctivae normal.     Pupils: Pupils are equal, round, and reactive to light.  Neck:     Vascular: No carotid bruit.  Cardiovascular:     Rate and Rhythm: Normal rate and regular rhythm.  Pulses: Normal pulses.     Heart sounds: Normal heart sounds. No murmur heard. Pulmonary:     Effort: Pulmonary effort is normal.     Breath sounds: Normal breath sounds. No wheezing.  Abdominal:     General: Bowel sounds are normal.     Palpations: Abdomen is soft.  Musculoskeletal:     Right shoulder: Normal.     Left shoulder: Swelling and tenderness present. Decreased range of motion. Decreased strength.     Cervical back: Normal range of motion.     Right lower leg: No edema.     Left lower leg: No edema.     Comments: Left Shoulder: Passive ROM limited to abduction at 90 degrees. Active ROM limited appx 95 degrees.  Positive empty can. Unable to perform passive internal rotation/abduction behind head, external rotation behind back.  Adduction limited to appx 20 degrees.  Unable to perform Gilberts test Weakness and pain present on internal and external rotation against resistance.   Skin:    General: Skin is warm and dry.     Capillary Refill: Capillary refill takes less than 2 seconds.  Neurological:     General: No focal deficit present.     Mental Status: She is alert and oriented to person, place, and time.  Psychiatric:        Mood and Affect: Mood normal.        Behavior: Behavior normal.        Thought Content: Thought content normal.        Judgment: Judgment normal.    Depression Screen PHQ 2/9 Scores 12/06/2018 09/05/2018  PHQ - 2 Score 0 0  PHQ- 9 Score 0 -   No results found for any visits on 10/22/21.  Assessment & Plan      Problem List Items Addressed This Visit     Chronic left shoulder pain - Primary    Ongoing/worsening pain of the left shoulder without known injury. Suspect  adhesive capsulitis vs. Possible rotator cuff tear present based on presentation and symptoms.  Examination limited due to decreased ROM and significant pain.  Recommend evaluation with sports medicine for possible MRI .  She has tried and failed oral steroids, injections, muscle relaxants, and NSAIDs for effective pain control.  Recommend ice and heat 20 minutes each at least twice a day.  Exercises and band provided for patient with instructions and demonstration on manuevers with strict instructions to avoid movements that cause pain.  Will have her see Dr. Burnard Bunting in near future for evaluation.  Tramadol for pain for now.  Instructions on proper NSAID use and avoidance of excessive consumption.       Relevant Medications   traMADol (ULTRAM) 50 MG tablet   Other Visit Diagnoses     Encounter for immunization       Relevant Orders   Flu Vaccine QUAD 6+ mos PF IM (Fluarix Quad PF) (Completed)        Return for Pap and CPE in near future- Dr. Billey Co for shoulder soon.      Amil Bouwman, Coralee Pesa, NP, DNP, AGNP-C Primary Care & Sports Medicine at [place]

## 2021-10-22 NOTE — Assessment & Plan Note (Signed)
Ongoing/worsening pain of the left shoulder without known injury. Suspect adhesive capsulitis vs. Possible rotator cuff tear present based on presentation and symptoms.  Examination limited due to decreased ROM and significant pain.  Recommend evaluation with sports medicine for possible MRI .  She has tried and failed oral steroids, injections, muscle relaxants, and NSAIDs for effective pain control.  Recommend ice and heat 20 minutes each at least twice a day.  Exercises and band provided for patient with instructions and demonstration on manuevers with strict instructions to avoid movements that cause pain.  Will have her see Dr. Burnard Bunting in near future for evaluation.  Tramadol for pain for now.  Instructions on proper NSAID use and avoidance of excessive consumption.

## 2021-10-22 NOTE — Patient Instructions (Addendum)
Thank you for choosing [place] at [facility] for your Primary Care needs. I am excited for the opportunity to partner with you to meet your health care goals. It was a pleasure meeting you today!  Recommendations from today's visit: I would like you to see Dr. Burnard Bunting to evaluate your shoulder. I have a feeling this is something called Frozen Shoulder, or adhesive capsulitis, but I want him to evaluate to be sure that there is not a rotator cuff tear present. You can schedule that when leaving today.  I have included stretching exercises for you to try at home.  Ice the shoulder twice a day for at least 20 minutes. Avoid painful maneuvers.  We will have you come back for pap and physical in the near future.   Information on diet, exercise, and health maintenance recommendations are listed below. This is information to help you be sure you are on track for optimal health and monitoring.   Please look over this and let us know if you have any questions or if you have completed any of the health maintenance outside of [facility] so that we can be sure your records are up to date.  ___________________________________________________________ About Me: I am an Adult-Geriatric Nurse Practitioner with a background in caring for patients for more than 20 years with a strong intensive care background. I provide primary care and sports medicine services to patients age 54 and older within this office. My education had a strong focus on caring for the older adult population, which I am passionate about. I am also the director of the APP Fellowship with [facility].   My desire is to provide you with the best service through preventive medicine and supportive care. I consider you a part of the medical team and value your input. I work diligently to ensure that you are heard and your needs are met in a safe and effective manner. I want you to feel comfortable with me as your provider and  want you to know that your health concerns are important to me.  For your information, our office hours are: Monday, Tuesday, and Thursday 8:00 AM - 5:00 PM Wednesday and Friday 8:00 AM - 12:00 PM.   In my time away from the office I am teaching new APP's within the system and am unavailable, but my partner, Dr. Burnard Bunting is in the office for emergent needs.   If you have questions or concerns, please call our office at [phone] or send [place] a MyChart message and we will respond as quickly as possible.  ____________________________________________________________ MyChart:  For all urgent or time sensitive needs we ask that you please call the office to avoid delays. Our number is [phone]. MyChart is not constantly monitored and due to the large volume of messages a day, replies may take up to 72 business hours.  MyChart Policy: MyChart allows for you to see your visit notes, after visit summary, provider recommendations, lab and tests results, make an appointment, request refills, and contact your provider or the office for non-urgent questions or concerns. Providers are seeing patients during normal business hours and do not have built in time to review MyChart messages.  We ask that you allow a minimum of 3 business days for responses to [employer]. For this reason, please do not send urgent requests through [place]. Please call the office at [phone]. New and ongoing conditions may require a visit. We have virtual and in person visit available for  your convenience.  Complex MyChart concerns may require a visit. Your provider may request you schedule a virtual or in person visit to ensure we are providing the best care possible. MyChart messages sent after 11:00 AM on Friday will not be received by the provider until Monday morning.    Lab and Test Results: You will receive your lab and test results on MyChart as soon as they are completed and results have been sent by the lab  or testing facility. Due to this service, you will receive your results BEFORE your provider.  I review lab and tests results each morning prior to seeing patients. Some results require collaboration with other providers to ensure you are receiving the most appropriate care. For this reason, we ask that you please allow a minimum of 3-5 business days from the time the ALL results have been received for your provider to receive and review lab and test results and contact you about these.  Most lab and test result comments from the provider will be sent through [place]. Your provider may recommend changes to the plan of care, follow-up visits, repeat testing, ask questions, or request an office visit to discuss these results. You may reply directly to this message or call the office at [phone] to provide information for the provider or set up an appointment. In some instances, you will be called with test results and recommendations. Please let us know if this is preferred and we will make note of this in your chart to provide this for you.    If you have not heard a response to your lab or test results in 5 business days from all results returning to [place], please call the office to let us know. We ask that you please avoid calling prior to this time unless there is an emergent concern. Due to high call volumes, this can delay the resulting process.  After Hours: For all non-emergency after hours needs, please call the office at [phone] and select the option to reach the on-call provider service. On-call services are shared between multiple [place] offices and therefore it will not be possible to speak directly with your provider. On-call providers may provide medical advice and recommendations, but are unable to provide refills for maintenance medications.  For all emergency or urgent medical needs after normal business hours, we recommend that you seek care at the closest Urgent Care or  Emergency Department to ensure appropriate treatment in a timely manner.  [facility] has a 24 hour emergency room located on the ground floor for your convenience.   Urgent Concerns During the Business Day Providers are seeing patients from 8AM to [place] with a busy schedule and are most often not able to respond to non-urgent calls until the end of the day or the next business day. If you should have URGENT concerns during the day, please call and speak to the nurse or schedule a same day appointment so that we can address your concern without delay.   Thank you, again, for choosing me as your health care partner. I appreciate your trust and look forward to learning more about you.   Melissa Keeler, DNP, AGNP-c ___________________________________________________________  Health Maintenance Recommendations Screening Testing Mammogram Every 1 -2 years based on history and risk factors Starting at age 73 Pap Smear Ages 21-39 every 3 years Ages 51-65 every 5 years with HPV testing More frequent testing may be required based on results and history Colon Cancer Screening Every  1-10 years based on test performed, risk factors, and history Starting at age 58 Bone Density Screening Every 2-10 years based on history Starting at age 86 for women Recommendations for men differ based on medication usage, history, and risk factors AAA Screening One time ultrasound Men 54-2 years old who have every smoked Lung Cancer Screening Low Dose Lung CT every 12 months Age 74-80 years with a 30 pack-year smoking history who still smoke or who have quit within the last 15 years  Screening Labs Routine  Labs: Complete Blood Count (CBC), Complete Metabolic Panel (CMP), Cholesterol (Lipid Panel) Every 6-12 months based on history and medications May be recommended more frequently based on current conditions or previous results Hemoglobin A1c Lab Every 3-12 months based on history  and previous results Starting at age 21 or earlier with diagnosis of diabetes, high cholesterol, BMI >26, and/or risk factors Frequent monitoring for patients with diabetes to ensure blood sugar control Thyroid Panel (TSH w/ T3 & T4) Every 6 months based on history, symptoms, and risk factors May be repeated more often if on medication HIV One time testing for all patients 32 and older May be repeated more frequently for patients with increased risk factors or exposure Hepatitis C One time testing for all patients 12 and older May be repeated more frequently for patients with increased risk factors or exposure Gonorrhea, Chlamydia Every 12 months for all sexually active persons 13-24 years Additional monitoring may be recommended for those who are considered high risk or who have symptoms PSA Men 24-65 years old with risk factors Additional screening may be recommended from age 52-69 based on risk factors, symptoms, and history  Vaccine Recommendations Tetanus Booster All adults every 10 years Flu Vaccine All patients 6 months and older every year COVID Vaccine All patients 12 years and older Initial dosing with booster May recommend additional booster based on age and health history HPV Vaccine 2 doses all patients age 64-26 Dosing may be considered for patients over 26 Shingles Vaccine (Shingrix) 2 doses all adults 73 years and older Pneumonia (Pneumovax 23) All adults 34 years and older May recommend earlier dosing based on health history Pneumonia (Prevnar 12) All adults 16 years and older Dosed 1 year after Pneumovax 23  Additional Screening, Testing, and Vaccinations may be recommended on an individualized basis based on family history, health history, risk factors, and/or exposure.  __________________________________________________________  Diet Recommendations for All Patients  I recommend that all patients maintain a diet low in saturated fats, carbohydrates, and  cholesterol. While this can be challenging at first, it is not impossible and small changes can make big differences.  Things to try: Decreasing the amount of soda, sweet tea, and/or juice to one or less per day and replace with water While water is always the first choice, if you do not like water you may consider adding a water additive without sugar to improve the taste other sugar free drinks Replace potatoes with a brightly colored vegetable at dinner Use healthy oils, such as canola oil or olive oil, instead of butter or hard margarine Limit your bread intake to two pieces or less a day Replace regular pasta with low carb pasta options Bake, broil, or grill foods instead of frying Monitor portion sizes  Eat smaller, more frequent meals throughout the day instead of large meals  An important thing to remember is, if you love foods that are not great for your health, you don't have to give them up completely.  Instead, allow these foods to be a reward when you have done well. Allowing yourself to still have special treats every once in a while is a nice way to tell yourself thank you for working hard to keep yourself healthy.   Also remember that every day is a new day. If you have a bad day and "fall off the wagon", you can still climb right back up and keep moving along on your journey!  We have resources available to help you!  Some websites that may be helpful include: www.http://carter.biz/  Www.VeryWellFit.com _____________________________________________________________  Activity Recommendations for All Patients  I recommend that all adults get at least 20 minutes of moderate physical activity that elevates your heart rate at least 5 days out of the week.  Some examples include: Walking or jogging at a pace that allows you to carry on a conversation Cycling (stationary bike or outdoors) Water aerobics Yoga Weight lifting Dancing If physical limitations prevent you from putting  stress on your joints, exercise in a pool or seated in a chair are excellent options.  Do determine your MAXIMUM heart rate for activity: YOUR AGE - 220 = MAX HeartRate   Remember! Do not push yourself too hard.  Start slowly and build up your pace, speed, weight, time in exercise, etc.  Allow your body to rest between exercise and get good sleep. You will need more water than normal when you are exerting yourself. Do not wait until you are thirsty to drink. Drink with a purpose of getting in at least 8, 8 ounce glasses of water a day plus more depending on how much you exercise and sweat.    If you begin to develop dizziness, chest pain, abdominal pain, jaw pain, shortness of breath, headache, vision changes, lightheadedness, or other concerning symptoms, stop the activity and allow your body to rest. If your symptoms are severe, seek emergency evaluation immediately. If your symptoms are concerning, but not severe, please let us know so that we can recommend further evaluation.

## 2021-10-26 ENCOUNTER — Ambulatory Visit: Payer: 59 | Admitting: Physician Assistant

## 2021-10-28 ENCOUNTER — Ambulatory Visit (INDEPENDENT_AMBULATORY_CARE_PROVIDER_SITE_OTHER): Payer: 59 | Admitting: Family Medicine

## 2021-10-28 ENCOUNTER — Encounter (HOSPITAL_BASED_OUTPATIENT_CLINIC_OR_DEPARTMENT_OTHER): Payer: Self-pay | Admitting: Family Medicine

## 2021-10-28 ENCOUNTER — Other Ambulatory Visit: Payer: Self-pay

## 2021-10-28 VITALS — Ht 64.0 in | Wt 206.0 lb

## 2021-10-28 DIAGNOSIS — G8929 Other chronic pain: Secondary | ICD-10-CM | POA: Diagnosis not present

## 2021-10-28 DIAGNOSIS — M25512 Pain in left shoulder: Secondary | ICD-10-CM

## 2021-10-28 NOTE — Patient Instructions (Addendum)
°  Medication Instructions:  Your physician recommends that you continue on your current medications as directed. Please refer to the Current Medication list given to you today. --If you need a refill on any your medications before your next appointment, please call your pharmacy first. If no refills are authorized on file call the office.--  Referrals/Procedures/Imaging: A referral has been placed for you to go to Outpatient Physical Therapy at [facility]. Someone from the scheduling department will be in contact with you in regards to coordinating your consultation. If you do not hear from any of the schedulers within 7-10 business days please give our office a call.  [facility] at [facility] on the 1st Floor Hours of Operation: Monday - Friday 8:00 am - 5:00 pm Closed on weekends and all major holidays (P) [phone]  Follow-Up: Your next appointment:   Your physician recommends that you schedule a follow-up appointment in: January 2023 with Dr. de [place]      Thanks for letting us be apart of your health journey!!        [facility]   Dr. Raymond de [place] II., MD, MPH               We recommend signing up for the patient portal called "MyChart".  Sign up information is provided on this After Visit Summary.  MyChart is used to connect with patients for Virtual Visits (Telemedicine).  Patients are able to view lab/test results, encounter notes, upcoming appointments, etc.  Non-urgent messages can be sent to your provider as well.   To learn more about what you can do with MyChart, please visit --  [email or web address].

## 2021-10-28 NOTE — Assessment & Plan Note (Signed)
Patient has been having ongoing left shoulder pain for at least 8 months.  No inciting event recalled.  She reports the pain has worsened in recent months.  Pain is primarily over lateral shoulder.  Pain is worse with certain shoulder movements, specifically with overhead motion, abduction, reaching behind her.  Has not had any associated numbness or tingling She has been seen by orthopedic surgeon previously, had x-ray and subacromial injection about 1 month ago.  Did not have significant relief with this Otherwise, patient has been using NSAID, Tylenol.  Has been doing some home exercises.  She was recommended to start physical therapy, however she did have knee surgery earlier in the year and has exhausted her physical therapy allowance from insurance. On exam, Left shoulder: Obvious swelling, bruising or erythema: absent Deformity of the shoulder: absent Active ROM: diminished range of motion with pain Passive ROM: diminished range with pain Strength: normal/normal Empty can: Positive, normal strength Hawkins: Negative Neer's: Positive Neurovascular exam: intact  Suspect that current symptoms are related to adhesive capsulitis given restriction in active and passive range of motion Discussed diagnosis, natural history, treatment options Patient would be interested in proceeding with ultrasound-guided glenohumeral joint injection, discussed optimal timing of this related to physical therapy Given that she has utilized her physical therapy for the current year, we will plan for her to arrange to establish with physical therapy in the new year and we will complete injection 1 week prior to her starting PT Discussed the importance of working with physical therapy as well as adhering to home exercise program.  Discussed that symptoms should improve for her over time, however it can be a prolonged time course in regards to achieving return of normal range of motion Can continue with conservative  measures to help control pain Plan for follow-up in the coming weeks, she will contact us after she schedules her physical therapy visit in order to arrange for procedure visit

## 2021-10-28 NOTE — Progress Notes (Signed)
° ° °  Procedures performed today:    None.  Independent interpretation of notes and tests performed by another provider:   3 x-ray views of left shoulder reviewed.  Images were taken by orthopedic surgeon.  No evidence of significant joint space narrowing at [facility] joint or glenohumeral joint.  No acute bony abnormality observed.  Brief History, Exam, Impression, and Recommendations:    Ht 5\' 4"  (1.626 m)    Wt 206 lb (93.4 kg)    BMI 35.36 kg/m   Chronic left shoulder pain Patient has been having ongoing left shoulder pain for at least 8 months.  No inciting event recalled.  She reports the pain has worsened in recent months.  Pain is primarily over lateral shoulder.  Pain is worse with certain shoulder movements, specifically with overhead motion, abduction, reaching behind her.  Has not had any associated numbness or tingling She has been seen by orthopedic surgeon previously, had x-ray and subacromial injection about 1 month ago.  Did not have significant relief with this Otherwise, patient has been using NSAID, Tylenol.  Has been doing some home exercises.  She was recommended to start physical therapy, however she did have knee surgery earlier in the year and has exhausted her physical therapy allowance from insurance. On exam, Left shoulder: Obvious swelling, bruising or erythema: absent Deformity of the shoulder: absent Active ROM: diminished range of motion with pain Passive ROM: diminished range with pain Strength: normal/normal Empty can: Positive, normal strength Hawkins: Negative Neer's: Positive Neurovascular exam: intact  Suspect that current symptoms are related to adhesive capsulitis given restriction in active and passive range of motion Discussed diagnosis, natural history, treatment options Patient would be interested in proceeding with ultrasound-guided glenohumeral joint injection, discussed optimal timing of this related to physical therapy Given that she has  utilized her physical therapy for the current year, we will plan for her to arrange to establish with physical therapy in the new year and we will complete injection 1 week prior to her starting PT Discussed the importance of working with physical therapy as well as adhering to home exercise program.  Discussed that symptoms should improve for her over time, however it can be a prolonged time course in regards to achieving return of normal range of motion Can continue with conservative measures to help control pain Plan for follow-up in the coming weeks, she will contact us after she schedules her physical therapy visit in order to arrange for procedure visit  Spent 30 minutes on this patient encounter, including preparation, chart review, face-to-face counseling with patient and coordination of care, and documentation of encounter   ___________________________________________ Tysean Vandervliet de [place], MD, ABFM, [facility] and [place]

## 2021-11-17 ENCOUNTER — Ambulatory Visit (HOSPITAL_BASED_OUTPATIENT_CLINIC_OR_DEPARTMENT_OTHER): Payer: Managed Care, Other (non HMO) | Admitting: Physical Therapy

## 2021-11-17 NOTE — Therapy (Incomplete)
OUTPATIENT PHYSICAL THERAPY SHOULDER EVALUATION   Patient Name: Melissa Harding MRN: [id number] DOB:27-Mar-1961, 61 y.o., female Today's Date: 11/17/2021    Past Medical History:  Diagnosis Date   Acute medial meniscus tear, right, subsequent encounter 03/11/2021   CLL (chronic lymphocytic leukemia) ([place]) 08/07/2020   Goals of care, counseling/discussion 08/07/2020   MMT (medial meniscus tear)    right   Past Surgical History:  Procedure Laterality Date   APPENDECTOMY     CESAREAN SECTION     2 previous   ECTOPIC PREGNANCY SURGERY     x2   KNEE ARTHROSCOPY Right 03/12/2021   Procedure: RIGHT KNEE ARTHROSCOPY WITH PARTIAL MEDIAL MENISCECTOMY;  Surgeon: Mcarthur Rossetti, MD;  Location: [place];  Service: Orthopedics;  Laterality: Right;   LAPAROSCOPIC TUBAL LIGATION     TUBAL LIGATION     Patient Active Problem List   Diagnosis Date Noted   Chronic left shoulder pain 10/22/2021   Primary osteoarthritis of right knee 07/21/2021   Acute medial meniscus tear, right, subsequent encounter 03/11/2021   CLL (chronic lymphocytic leukemia) ([place]) 08/07/2020   Personal history of CLL (chronic lymphocytic leukemia) 11/01/2019   Lymphocytosis 02/05/2019   MENOPAUSAL SYNDROME 03/05/2009    PCP: [name], NP  REFERRING PROVIDER: de [place], Raymond J, MD  REFERRING DIAG: [phone] (ICD-10-CM) - Chronic left shoulder pain   THERAPY DIAG:  No diagnosis found.   ONSET DATE: ***  SUBJECTIVE:                                                                                                                                                                                      SUBJECTIVE STATEMENT: Onset::: Pt saw orthopedic MD in November, had x rays, and was given a subacromial injection.  MD note indicated possible shoulder impingement.   for shoulder impingement syndrome of the left shoulder. Patient has been having ongoing left shoulder pain for at least 8  months. No inciting event recalled She has been seen by orthopedic surgeon previously, had x-ray and subacromial injection about 1 month ago. Did not have significant relief with this Suspect that current symptoms are related to adhesive capsulitis given restriction in active and passive range of motion   Pt saw PCP and was recommended an US guided injection prior to PT.  PERTINENT HISTORY: CLL (chronic lymphocytic leukemia; R knee arthroscopy with partial medial meniscectomy   PAIN:  Are you having pain? {yes/no:20286} VAS scale: ***/10 Pain location: *** Pain orientation: {Pain Orientation:25161}  PAIN TYPE: {type:313116} Pain description: {PAIN DESCRIPTION:21022940}  Aggravating factors: *** Relieving factors: ***  PRECAUTIONS: {Therapy precautions:24002}  WEIGHT BEARING RESTRICTIONS {Yes ***/No:24003}  FALLS:  Has patient fallen in last 6 months? {yes/no:20286} Number of falls: ***  LIVING ENVIRONMENT: Lives with: {[facility] lives with:25569::"lives with their family"} Lives in: {Lives in:25570} Stairs: {yes/no:20286}; {Stairs:24000} Has following equipment at home: {Assistive devices:23999}  OCCUPATION: ***  PLOF: {PLOF:24004}  PATIENT GOALS ***  OBJECTIVE:   DIAGNOSTIC FINDINGS:  X ray of L shoulder:  No acute bony abnormality observed   PATIENT SURVEYS:  {rehab surveys:24030:a}  COGNITION:  Overall cognitive status: {cognition:24006}     SENSATION:  Light touch: {intact/deficits:24005}  Stereognosis: {intact/deficits:24005}  Hot/Cold: {intact/deficits:24005}  Proprioception: {intact/deficits:24005}  POSTURE: ***  PALPATION: ***  UPPER EXTREMITY AROM/PROM:  A/PROM Right 11/17/2021 Left 11/17/2021  Shoulder flexion    Shoulder extension    Shoulder abduction    Shoulder adduction    Shoulder internal rotation    Shoulder external rotation    Elbow flexion    Elbow extension    Wrist flexion    Wrist extension    Wrist ulnar deviation    Wrist  radial deviation    Wrist pronation    Wrist supination    (Blank rows = not tested)  UPPER EXTREMITY MMT:  MMT Right 11/17/2021 Left 11/17/2021  Shoulder flexion    Shoulder extension    Shoulder abduction    Shoulder adduction    Middle trapezius    Lower trapezius    Elbow flexion    Elbow extension    Wrist flexion    Wrist extension    Wrist ulnar deviation    Wrist radial deviation    Wrist pronation    Wrist supination    Grip strength (lbs)    (Blank rows = not tested)  SHOULDER SPECIAL TESTS:  Impingement tests: {shoulder impingement test:25231:a}  SLAP lesions: {SLAP lesions:25232}  Instability tests: {shoulder instability test:25233}  Rotator cuff assessment: {rotator cuff assessment:25234}  Biceps assessment: {biceps assessment:25235}  JOINT MOBILITY TESTING:  ***  PALPATION:  ***  POSTURE:  ***   TODAY'S TREATMENT:  ***   PATIENT EDUCATION: Education details: *** Person educated: {Person educated:25204} Education method: {Education Method:25205} Education comprehension: {Education Comprehension:25206}   HOME EXERCISE PROGRAM: ***  ASSESSMENT:  CLINICAL IMPRESSION: Patient is a *** y.o. *** who was seen today for physical therapy evaluation and treatment for ***. Objective impairments include {opptimpairments:25111}. These impairments are limiting patient from {activity limitations:25113}. Personal factors including {Personal factors:25162} are also affecting patient's functional outcome. Patient will benefit from skilled PT to address above impairments and improve overall function.  REHAB POTENTIAL: {rehabpotential:25112}  CLINICAL DECISION MAKING: {clinical decision making:25114}  EVALUATION COMPLEXITY: {Evaluation complexity:25115}   GOALS: Goals reviewed with patient? {yes/no:20286}  SHORT TERM GOALS:  STG Name Target Date Goal status  1 *** Baseline:  {follow up:25551} {GOALSTATUS:25110}  2 *** Baseline:  {follow up:25551}  {GOALSTATUS:25110}  3 *** Baseline: {follow up:25551} {GOALSTATUS:25110}  4 *** Baseline: {follow up:25551} {GOALSTATUS:25110}  5 *** Baseline: {follow up:25551} {GOALSTATUS:25110}  6 *** Baseline: {follow up:25551} {GOALSTATUS:25110}  7 *** Baseline: {follow up:25551} {GOALSTATUS:25110}   LONG TERM GOALS:   LTG Name Target Date Goal status  1 *** Baseline: {follow up:25551} {GOALSTATUS:25110}  2 *** Baseline: {follow up:25551} {GOALSTATUS:25110}  3 *** Baseline: {follow up:25551} {GOALSTATUS:25110}  4 *** Baseline: {follow up:25551} {GOALSTATUS:25110}  5 *** Baseline: {follow up:25551} {GOALSTATUS:25110}  6 *** Baseline: {follow up:25551} {GOALSTATUS:25110}  7 *** Baseline: {follow up:25551} {GOALSTATUS:25110}   PLAN: PT FREQUENCY: {rehab frequency:25116}  PT DURATION: {rehab duration:25117}  PLANNED INTERVENTIONS: {rehab planned interventions:25118::"Therapeutic exercises","Therapeutic activity","Neuro Muscular re-education","Balance training","Gait  training","Patient/Family education","Joint mobilization"}  PLAN FOR NEXT SESSION: ***   Ronny Flurry 11/17/2021, 6:53 AM

## 2021-11-20 ENCOUNTER — Ambulatory Visit (HOSPITAL_BASED_OUTPATIENT_CLINIC_OR_DEPARTMENT_OTHER): Payer: 59 | Admitting: Family Medicine

## 2021-11-27 ENCOUNTER — Ambulatory Visit (HOSPITAL_BASED_OUTPATIENT_CLINIC_OR_DEPARTMENT_OTHER): Payer: 59 | Admitting: Family Medicine

## 2021-12-01 ENCOUNTER — Ambulatory Visit (INDEPENDENT_AMBULATORY_CARE_PROVIDER_SITE_OTHER): Payer: Managed Care, Other (non HMO)

## 2021-12-01 ENCOUNTER — Encounter (HOSPITAL_BASED_OUTPATIENT_CLINIC_OR_DEPARTMENT_OTHER): Payer: Self-pay | Admitting: Family Medicine

## 2021-12-01 ENCOUNTER — Ambulatory Visit (INDEPENDENT_AMBULATORY_CARE_PROVIDER_SITE_OTHER): Payer: Managed Care, Other (non HMO) | Admitting: Family Medicine

## 2021-12-01 ENCOUNTER — Other Ambulatory Visit: Payer: Self-pay

## 2021-12-01 DIAGNOSIS — M25512 Pain in left shoulder: Secondary | ICD-10-CM | POA: Diagnosis not present

## 2021-12-01 DIAGNOSIS — G8929 Other chronic pain: Secondary | ICD-10-CM

## 2021-12-01 NOTE — Patient Instructions (Signed)
°  Medication Instructions:  Your physician recommends that you continue on your current medications as directed. Please refer to the Current Medication list given to you today. --If you need a refill on any your medications before your next appointment, please call your pharmacy first. If no refills are authorized on file call the office.--  Follow-Up: Your next appointment:   Your physician recommends that you schedule a follow-up appointment in: 2 MONTHS with Dr. de [place]      Thanks for letting us be apart of your health journey!!        [facility]   Dr. Raymond de [place] II., MD, MPH               We recommend signing up for the patient portal called "MyChart".  Sign up information is provided on this After Visit Summary.  MyChart is used to connect with patients for Virtual Visits (Telemedicine).  Patients are able to view lab/test results, encounter notes, upcoming appointments, etc.  Non-urgent messages can be sent to your provider as well.   To learn more about what you can do with MyChart, please visit --  [email or web address].

## 2021-12-01 NOTE — Assessment & Plan Note (Signed)
Presenting for procedure visit related to chronic left shoulder pain due to suspected adhesive capsulitis Again reviewed procedure and expected results, patient is interested in proceeding, procedure note above She will be starting back with PT, advised on HEP

## 2021-12-01 NOTE — Progress Notes (Signed)
° ° °  Procedures performed today:    Procedure: Real-time Ultrasound Guided injection of the left glenohumeral joint Device: Samsung HS60  Verbal informed consent obtained.  Time-out conducted.  Noted no overlying erythema, induration, or other signs of local infection.  Skin prepped in a sterile fashion.  Local anesthesia: Topical Ethyl chloride.  With sterile technique and under real time ultrasound guidance: 1 cc Kenalog 40, 3 cc lidocaine injected easily Completed without difficulty  Advised to call if fevers/chills, erythema, induration, drainage, or persistent bleeding.  Images permanently stored and available for review in PACS.  Impression: Technically successful ultrasound guided injection.  Independent interpretation of notes and tests performed by another provider:   None.  Brief History, Exam, Impression, and Recommendations:    Chronic left shoulder pain Presenting for procedure visit related to chronic left shoulder pain due to suspected adhesive capsulitis Again reviewed procedure and expected results, patient is interested in proceeding, procedure note above She will be starting back with PT, advised on HEP   ___________________________________________ Melissa Sanagustin de [place], MD, ABFM, [facility] and [place]

## 2021-12-07 ENCOUNTER — Ambulatory Visit (HOSPITAL_BASED_OUTPATIENT_CLINIC_OR_DEPARTMENT_OTHER): Payer: Managed Care, Other (non HMO) | Attending: Family Medicine | Admitting: Physical Therapy

## 2021-12-07 ENCOUNTER — Other Ambulatory Visit: Payer: Self-pay

## 2021-12-07 ENCOUNTER — Encounter (HOSPITAL_BASED_OUTPATIENT_CLINIC_OR_DEPARTMENT_OTHER): Payer: Self-pay | Admitting: Physical Therapy

## 2021-12-07 DIAGNOSIS — M25612 Stiffness of left shoulder, not elsewhere classified: Secondary | ICD-10-CM | POA: Diagnosis present

## 2021-12-07 DIAGNOSIS — G8929 Other chronic pain: Secondary | ICD-10-CM | POA: Diagnosis present

## 2021-12-07 DIAGNOSIS — M25512 Pain in left shoulder: Secondary | ICD-10-CM | POA: Insufficient documentation

## 2021-12-07 DIAGNOSIS — M6281 Muscle weakness (generalized): Secondary | ICD-10-CM

## 2021-12-07 NOTE — Therapy (Signed)
OUTPATIENT PHYSICAL THERAPY SHOULDER EVALUATION   Patient Name: Melissa Harding MRN: [id number] DOB:[birth year], 61 y.o., female Today's Date: 12/07/2021   PT End of Session - 12/07/21 1353     Visit Number 1    Number of Visits 19    Date for PT Re-Evaluation 03/07/22    Authorization Type [employer] Managed    PT Start Time 1345    PT Stop Time 1430    PT Time Calculation (min) 45 min    Activity Tolerance Patient tolerated treatment well    Behavior During Therapy [facility] for tasks assessed/performed             Past Medical History:  Diagnosis Date   Acute medial meniscus tear, right, subsequent encounter 03/11/2021   CLL (chronic lymphocytic leukemia) ([place]) 08/07/2020   Goals of care, counseling/discussion 08/07/2020   MMT (medial meniscus tear)    right   Past Surgical History:  Procedure Laterality Date   APPENDECTOMY     CESAREAN SECTION     2 previous   ECTOPIC PREGNANCY SURGERY     x2   KNEE ARTHROSCOPY Right 03/12/2021   Procedure: RIGHT KNEE ARTHROSCOPY WITH PARTIAL MEDIAL MENISCECTOMY;  Surgeon: Mcarthur Rossetti, MD;  Location: [place];  Service: Orthopedics;  Laterality: Right;   LAPAROSCOPIC TUBAL LIGATION     TUBAL LIGATION     Patient Active Problem List   Diagnosis Date Noted   Chronic left shoulder pain 10/22/2021   Primary osteoarthritis of right knee 07/21/2021   Acute medial meniscus tear, right, subsequent encounter 03/11/2021   CLL (chronic lymphocytic leukemia) ([place]) 08/07/2020   Personal history of CLL (chronic lymphocytic leukemia) 11/01/2019   Lymphocytosis 02/05/2019   MENOPAUSAL SYNDROME 03/05/2009    PCP: [name], NP  REFERRING PROVIDER: de [place], Raymond J, MD  REFERRING DIAG: [phone] (ICD-10-CM) - Chronic left shoulder pain   THERAPY DIAG:  Chronic left shoulder pain - Plan: PT plan of care cert/re-cert  Stiffness of left shoulder, not elsewhere classified - Plan: PT plan of care  cert/re-cert  Muscle weakness (generalized) - Plan: PT plan of care cert/re-cert   ONSET DATE: 21/1941  SUBJECTIVE:                                                                                                                                                                                      SUBJECTIVE STATEMENT: Patient has been having ongoing left shoulder pain for at least 8 months. Pain is worse with certain shoulder movements, specifically with overhead motion, abduction, reaching behind her back for her bra.  Pt denies NT.  She has been seen by orthopedic  surgeon previously. She had a subacromial injection, did not have significant relief. Pt states she recently had an injection just a week before PT. After it settled down, the pain was still the same. Pt states neck position does not change pain. Pt states it feels like a deep aching pain and increases from 0 to 10 with movements. There is no ramping up of pain. Pt reports sleeping on her L side more often before pain started happening.     PERTINENT HISTORY: R knee scope  PAIN:  Are you having pain? No NPRS scale: 0/10, Worst 10/10 Pain location: lateral shoulder but not past elbow Pain orientation: Left  Pain description: aching  Aggravating factors: reaching behind back, OH, reaching suddenly, sleeping on the L Relieving factors: rest, no sudden movements   PRECAUTIONS: None  WEIGHT BEARING RESTRICTIONS No  FALLS:  Has patient fallen in last 6 months? 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   PLOF: Independent  PATIENT GOALS : improve her ROM and return to normal  OBJECTIVE:   DIAGNOSTIC FINDINGS:  XR L shoulder: 3 views left shoulder show no acute findings  PATIENT SURVEYS:  FOTO 49 65 at D/C 10 pts MCII  COGNITION:  Overall cognitive status:  Within functional limits for tasks assessed     SENSATION:  Light touch: Appears intact   POSTURE: WFL  PALPATION: TTP of L supra and infra  UPPER EXTREMITY AROM/PROM:   PROM within 20 degrees of R side; empty end feel vs stiff  A/PROM Right 12/07/2021 Left 12/07/2021  Shoulder flexion WFL 79  Shoulder extension WFL 15  Shoulder abduction WFL 97  Shoulder adduction    Shoulder internal rotation WFL L5  Shoulder external rotation WFL C3  (Blank rows = not tested)  UPPER EXTREMITY MMT:  MMT Right 12/07/2021 Left 12/07/2021  Shoulder flexion 4+/5 4/5 p! With all motions  Shoulder extension 4+/5 4/5  Shoulder abduction 4+/5 4/5  Shoulder adduction    Shoulder internal rotation 4+/5 4/5  Shoulder external rotation 4+/5 4/5  (Blank rows = not tested)  SHOULDER SPECIAL TESTS:  Impingement tests: Hawkins/Kennedy impingement test: positive  and Painful arc test: positive    Rotator cuff assessment: Empty can test: positive , Full can test: positive , and Belly press test: negative    JOINT MOBILITY TESTING:  Unable to tolerate due to increase in pain to get into 90 deg ABD; no stiffness noted with PROM or gentle glides in open pack  TODAY'S TREATMENT:  Exercises Standing Isometric Shoulder External Rotation with Doorway - 2 x daily - 7 x weekly - 1 sets - 10 reps - 3 hold Standing Shoulder Row with Anchored Resistance - 2 x daily - 7 x weekly - 2 sets - 10 reps Seated Shoulder Flexion Towel Slide at Table Top - 2 x daily - 7 x weekly - 1 sets - 10 reps - 5 hold  Infra self massage with tennis ball   PATIENT EDUCATION: Education details: MOI, diagnosis, prognosis, anatomy, exercise progression, acceptable levels of pain, DOMS expectations, muscle firing,  envelope of function, HEP, POC  Person educated: Patient Education method: Explanation, Demonstration, Tactile cues, Verbal cues, and Handouts Education comprehension: verbalized understanding, returned demonstration,  verbal cues required, and tactile cues required   HOME EXERCISE PROGRAM: Access Code: [email or web address] URL: [email or web address] Date: 12/07/2021 Prepared  by: Daleen Bo   ASSESSMENT:  CLINICAL IMPRESSION: Patient is a 61 y.o. female who was seen today for physical therapy evaluation and treatment for cc of L shoulder pain. Pt's s/s appear consistent with L RTC irritation. PROM at today's session did not suggest excessive stiffness or capsular pattern, though pt may be in the early stages of adhesive capsulitis given predisposing health factors. Pt's pain is signficantly sensitive and irritable with A/PROM.  Objective impairments include decreased ROM, decreased strength, hypomobility, increased muscle spasms, impaired flexibility, impaired UE functional use, improper body mechanics, postural dysfunction, and pain. These impairments are limiting patient from cleaning, community activity, driving, meal prep, occupation, laundry, yard work, and shopping. Personal factors including Age, Fitness, Time since onset of injury/illness/exacerbation, and 1 comorbidity:    are also affecting patient's functional outcome. Patient will benefit from skilled PT to address above impairments and improve overall function.  REHAB POTENTIAL: Good  CLINICAL DECISION MAKING: Stable/uncomplicated  EVALUATION COMPLEXITY: Low   GOALS:   SHORT TERM GOALS:  STG Name Target Date Goal status  1 Pt will become independent with HEP in order to demonstrate synthesis of PT education.  Baseline:  01/18/2022 INITIAL  2 Pt will report at least 2 pt reduction on NPRS scale for pain in order to demonstrate functional improvement with household activity, self care, and ADL.  Baseline:  01/18/2022 INITIAL  3 Pt will be able to demonstrate symmetrical BHB reach in order to demonstrate functional improvement in UE function for self-care and house hold duties.  Baseline: 01/18/2022 INITIAL  4 Pt will score at least 10 pt  increase on FOTO to demonstrate functional improvement in MCII and pt perceived function.   Baseline: 01/18/2022 INITIAL   LONG TERM GOALS:   LTG Name Target Date Goal status  1 Pt  will become independent with final HEP in order to demonstrate synthesis of PT education.  Baseline: 03/01/2022 INITIAL  2 Pt will be able to reach [facility] and carry/hold >3lbs in order to demonstrate functional improvement in L UE strength for return to PLOF and exercise.   Baseline: 03/01/2022 INITIAL  3 Pt will be able to demonstrate full symmetrical UE ROM in order to demonstrate functional improvement in UE function for return to PLOF.  Baseline: 03/01/2022 INITIAL  4 Pt will score >/= 65 on FOTO to demonstrate improvement in L shoulder perceived function.  Baseline: 03/01/2022 INITIAL   PLAN: PT FREQUENCY: 1-2x/week  PT DURATION: 12 weeks (likely D/C by 8 weeks)  PLANNED INTERVENTIONS: Therapeutic exercises, Therapeutic activity, Neuro Muscular re-education, Patient/Family education, Joint mobilization, Aquatic Therapy, Dry Needling, Electrical stimulation, Spinal mobilization, Cryotherapy, Moist heat, Compression bandaging, Taping, Vasopneumatic device, Traction, Ultrasound, and Manual therapy  PLAN FOR NEXT SESSION: review HEP, STM to infra, trial crossbody stretch, iso in different directions  Daleen Bo PT, DPT 12/07/21 3:16 PM

## 2021-12-11 ENCOUNTER — Encounter (HOSPITAL_BASED_OUTPATIENT_CLINIC_OR_DEPARTMENT_OTHER): Payer: Self-pay | Admitting: Family Medicine

## 2021-12-23 ENCOUNTER — Other Ambulatory Visit (HOSPITAL_COMMUNITY)
Admission: RE | Admit: 2021-12-23 | Discharge: 2021-12-23 | Disposition: A | Discharge status: Home / Self Care | Payer: Commercial Managed Care - HMO | Source: Ambulatory Visit | Attending: Nurse Practitioner | Admitting: Nurse Practitioner

## 2021-12-23 ENCOUNTER — Encounter (HOSPITAL_BASED_OUTPATIENT_CLINIC_OR_DEPARTMENT_OTHER): Payer: Self-pay | Admitting: Nurse Practitioner

## 2021-12-23 ENCOUNTER — Other Ambulatory Visit: Payer: Self-pay

## 2021-12-23 ENCOUNTER — Ambulatory Visit (INDEPENDENT_AMBULATORY_CARE_PROVIDER_SITE_OTHER): Payer: Managed Care, Other (non HMO) | Admitting: Nurse Practitioner

## 2021-12-23 VITALS — BP 128/82 | HR 78 | Ht 62.0 in | Wt 206.8 lb

## 2021-12-23 DIAGNOSIS — Z01419 Encounter for gynecological examination (general) (routine) without abnormal findings: Secondary | ICD-10-CM | POA: Diagnosis not present

## 2021-12-23 DIAGNOSIS — Z124 Encounter for screening for malignant neoplasm of cervix: Secondary | ICD-10-CM | POA: Diagnosis present

## 2021-12-23 DIAGNOSIS — Z0000 Encounter for general adult medical examination without abnormal findings: Secondary | ICD-10-CM

## 2021-12-23 DIAGNOSIS — Z6837 Body mass index (BMI) 37.0-37.9, adult: Secondary | ICD-10-CM

## 2021-12-23 DIAGNOSIS — C9110 Chronic lymphocytic leukemia of B-cell type not having achieved remission: Secondary | ICD-10-CM

## 2021-12-23 MED ORDER — PHENTERMINE HCL 37.5 MG PO CAPS: ORAL_CAPSULE | ORAL | 2 refills | Status: DC

## 2021-12-23 NOTE — Patient Instructions (Signed)
I sent in the phentermine for you to try. If you find this is not working for you this time, please let me know.   I do want to get some additional labs that the cancer center does not usually do. I will let you know if we have any concerns with the results once these come back.   Have a wonderful time in [place] and Happy Birthday!!!

## 2021-12-23 NOTE — Progress Notes (Signed)
BP 128/82    Pulse 78    Ht _0  (1.575 m)    Wt 206 lb 12.8 oz (93.8 kg)    SpO2 98%    BMI 37.82 kg/m    Subjective:    Patient ID: Melissa Harding, female    DOB: [birth year], 61 y.o.   MRN: [id number]  HPI: Melissa Harding is a 61 y.o. female presenting on 12/23/2021 for comprehensive medical examination.   Current medical concerns include: weight gain.  Patient expresses concerns with difficulty losing weight. She has been working on diet and activity, but is struggling with management. She has tried phentermine in the past with success, but is willing to consider other options that may be helpful for her.   She reports regular vision exams q1-5y: yes She reports regular dental exams q 40m yes Her diet consists of:  overall health She endorses exercise and/or activity of:  Very active with work She works in:  Health care- personal care  She endorses ETOH use ( 1 glass a day ) She denies nictoine use  She denies illegal substance use   She reports post menopausal status with no bleeding   She is sexually active with one partner She denies concerns today about STI  She denies concerns about skin changes today  She denies concerns about bowel changes today  She denies concerns about bladder changes today   Most Recent Depression Screen:  [facility] 12/06/2018 09/05/2018  Decreased Interest 0 0 0  Down, Depressed, Hopeless 0 0 0  PHQ - 2 Score 0 0 0  Altered sleeping - 0 -  Tired, decreased energy - 0 -  Change in appetite - 0 -  Feeling bad or failure about yourself  - 0 -  Trouble concentrating - 0 -  Moving slowly or fidgety/restless - 0 -  Suicidal thoughts - 0 -  PHQ-9 Score - 0 -   Most Recent Anxiety Screen:  GAD 7 : Generalized Anxiety Score 12/06/2018 09/05/2018  Nervous, Anxious, on Edge 0 0  Control/stop worrying 0 0  Worry too much - different things 0 0  Trouble relaxing 0 0  Restless 0 0  Easily annoyed or irritable 0 0  Afraid  - awful might happen 0 0  Total GAD 7 Score 0 0   Most Recent Fall Screen: Fall Risk  12/25/2021 11/19/2019  Falls in the past year? 0 0  Number falls in past yr: 0 -  Injury with Fall? 0 -  Risk for fall due to : No Fall Risks -  Follow up Falls evaluation completed -    All ROS negative except what is listed above and in the HPI.   Past medical history, surgical history, medications, allergies, family history and social history reviewed with patient today and changes made to appropriate areas of the chart.  Past Medical History:  Past Medical History:  Diagnosis Date   Acute medial meniscus tear, right, subsequent encounter 03/11/2021   C[place] [facility]ocytic [place]) [place] 08/07/2020   Goals of care, counseling/discussion 08/07/2020   MMT (medial meniscus tear)    right   Medications:  Current Outpatient Medications on File Prior to Visit  Medication Sig   diclofenac (VOLTAREN) 75 MG EC tablet Take 75 mg by mouth 2 (two) times daily.   tiZANidine (ZANAFLEX) 4 MG tablet Take 1 tablet (4 mg total) by mouth every 8 (eight) hours as needed for muscle spasms.   traMADol (  ULTRAM) 50 MG tablet Take 50 mg by mouth every 6 (six) hours as needed.   No current facility-administered medications on file prior to visit.   Surgical History:  Past Surgical History:  Procedure Laterality Date   APPENDECTOMY     CESAREAN SECTION     2 previous   ECTOPIC PREGNANCY SURGERY     x2   KNEE ARTHROSCOPY Right 03/12/2021   Procedure: RIGHT KNEE ARTHROSCOPY WITH PARTIAL MEDIAL MENISCECTOMY;  Surgeon: Mcarthur Rossetti, MD;  Location: [facility];  Service: Orthopedics;  Laterality: Right;   LAPAROSCOPIC TUBAL LIGATION     TUBAL LIGATION     Allergies:  Allergies  Allergen Reactions   Ampicillin Other (See Comments)    REACTION: seizures Did it involve swelling of the face/tongue/throat, SOB, or low BP? No Did it involve sudden or severe rash/hives, skin peeling, or any  reaction on the inside of your mouth or nose? No Did you need to seek medical attention at a hospital or doctor's office? Yes When did it last happen?      36 yrs ago If all above answers are "NO", may proceed with cephalosporin use.   Percocet [Oxycodone-Acetaminophen] Nausea Only   Social History:  Social History   Socioeconomic History   Marital status: Married    Spouse name: steven   Number of children: 2   Years of education: 12   Highest education level: Not on file  Occupational History   Occupation: part time    Comment: self employed  Tobacco Use   Smoking status: Former    Types: Cigarettes    Quit date: 2000    Years since quitting: 23.1   Smokeless tobacco: Never  Vaping Use   Vaping Use: Never used  Substance and Sexual Activity   Alcohol use: Yes    Alcohol/week: 1.0 standard drink    Types: 1 Glasses of wine per week    Comment: social   Drug use: No   Sexual activity: Yes    Birth control/protection: Surgical    Comment: BTL  Other Topics Concern   Not on file  Social History Narrative   Not on file   Social Determinants of Health   Financial Resource Strain: Not on file  Food Insecurity: Not on file  Transportation Needs: Not on file  Physical Activity: Not on file  Stress: Not on file  Social Connections: Not on file  Intimate Partner Violence: Not on file   Social History   Tobacco Use  Smoking Status Former   Types: Cigarettes   Quit date: 2000   Years since quitting: 23.1  Smokeless Tobacco Never   Social History   Substance and Sexual Activity  Alcohol Use Yes   Alcohol/week: 1.0 standard drink   Types: 1 Glasses of wine per week   Comment: social   Family History:  Family History  Problem Relation Age of Onset   Hypertension Mother    Hypertension Father    Cancer Paternal Grandmother    Stomach cancer Paternal Grandmother    Heart attack Paternal Grandfather    Colon cancer Neg Hx    Colon polyps Neg Hx     Esophageal cancer Neg Hx    Rectal cancer Neg Hx        Objective:    BP 128/82    Pulse 78    Ht $R'5\' 2"'UU$  (1.575 m)    Wt 206 lb 12.8 oz (93.8 kg)    SpO2 98%  BMI 37.82 kg/m   Wt Readings from Last 3 Encounters:  12/23/21 206 lb 12.8 oz (93.8 kg)  10/28/21 206 lb (93.4 kg)  10/22/21 206 lb 3.2 oz (93.5 kg)    Physical Exam Vitals and nursing note reviewed. Exam conducted with a chaperone present.  Constitutional:      General: She is not in acute distress.    Appearance: Normal appearance.  HENT:     Head: Normocephalic and atraumatic.     Right Ear: Hearing, tympanic membrane, ear canal and external ear normal.     Left Ear: Hearing, tympanic membrane, ear canal and external ear normal.     Nose: Nose normal.     Right Sinus: No maxillary sinus tenderness or frontal sinus tenderness.     Left Sinus: No maxillary sinus tenderness or frontal sinus tenderness.     Mouth/Throat:     Lips: Pink.     Mouth: Mucous membranes are moist.     Pharynx: Oropharynx is clear.  Eyes:     General: Lids are normal. Vision grossly intact.     Extraocular Movements: Extraocular movements intact.     Conjunctiva/sclera: Conjunctivae normal.     Pupils: Pupils are equal, round, and reactive to light.     Funduscopic exam:    Right eye: No hemorrhage. Red reflex present.        Left eye: No hemorrhage. Red reflex present.    Visual Fields: Right eye visual fields normal and left eye visual fields normal.  Neck:     Thyroid: No thyromegaly.     Vascular: No carotid bruit.  Cardiovascular:     Rate and Rhythm: Normal rate and regular rhythm.     Chest Wall: PMI is not displaced.     Pulses: Normal pulses.     Heart sounds: Normal heart sounds. No murmur heard. Pulmonary:     Effort: Pulmonary effort is normal. No respiratory distress.     Breath sounds: Normal breath sounds.  Chest:     Chest wall: No mass, deformity or tenderness.  Breasts:    Breasts are symmetrical.     Right:  Normal.     Left: Normal.  Abdominal:     General: Bowel sounds are normal. There is no distension or abdominal bruit.     Palpations: Abdomen is soft. There is no hepatomegaly, splenomegaly or mass.     Tenderness: There is no abdominal tenderness. There is no right CVA tenderness, left CVA tenderness or guarding.     Hernia: No hernia is present. There is no hernia in the left inguinal area or right inguinal area.  Genitourinary:    General: Normal vulva.     Exam position: Lithotomy position.     Pubic Area: No rash.      Tanner stage (genital): 5.     Labia:        Right: No rash or tenderness.        Left: No rash.      Urethra: No prolapse or urethral swelling.     Vagina: Normal. No vaginal discharge.     Cervix: Normal.     Uterus: Normal.      Adnexa: Right adnexa normal and left adnexa normal.     Rectum: Normal.  Musculoskeletal:        General: Normal range of motion.     Cervical back: Full passive range of motion without pain and neck supple. No tenderness.     Right  lower leg: No edema.     Left lower leg: No edema.  Feet:     Right foot:     Skin integrity: Skin integrity normal.     Toenail Condition: Right toenails are normal.     Left foot:     Skin integrity: Skin integrity normal.     Toenail Condition: Left toenails are normal.  Lymphadenopathy:     Cervical: No cervical adenopathy.     Upper Body:     Right upper body: No supraclavicular, axillary or pectoral adenopathy.     Left upper body: No supraclavicular, axillary or pectoral adenopathy.     Lower Body: No right inguinal adenopathy.  Skin:    General: Skin is warm and dry.     Capillary Refill: Capillary refill takes less than 2 seconds.     Nails: There is no clubbing.  Neurological:     General: No focal deficit present.     Mental Status: She is alert and oriented to person, place, and time.     Cranial Nerves: No cranial nerve deficit.     Sensory: Sensation is intact. No sensory  deficit.     Motor: Motor function is intact. No weakness.     Coordination: Coordination is intact. Coordination normal.     Gait: Gait is intact.  Psychiatric:        Attention and Perception: Attention normal.        Mood and Affect: Mood normal.        Speech: Speech normal.        Behavior: Behavior normal. Behavior is cooperative.        Thought Content: Thought content normal.        Cognition and Memory: Cognition and memory normal.        Judgment: Judgment normal.    Results for orders placed or performed in visit on 12/23/21  CBC with Differential/Platelet  Result Value Ref Range   WBC 12.5 (H) 3.4 - 10.8 x10E3/uL   RBC 4.88 3.77 - 5.28 x10E6/uL   Hemoglobin 14.1 11.1 - 15.9 g/dL   Hematocrit 43.6 34.0 - 46.6 %   MCV 89 79 - 97 fL   MCH 28.9 26.6 - 33.0 pg   MCHC 32.3 31.5 - 35.7 g/dL   RDW 13.3 11.7 - 15.4 %   Platelets 457 (H) 150 - 450 x10E3/uL   Neutrophils 38 Not Estab. %   Lymphs 54 Not Estab. %   Monocytes 5 Not Estab. %   Eos 2 Not Estab. %   Basos 1 Not Estab. %   Neutrophils Absolute 4.7 1.4 - 7.0 x10E3/uL   Lymphocytes Absolute 6.8 (H) 0.7 - 3.1 x10E3/uL   Monocytes Absolute 0.6 0.1 - 0.9 x10E3/uL   EOS (ABSOLUTE) 0.2 0.0 - 0.4 x10E3/uL   Basophils Absolute 0.1 0.0 - 0.2 x10E3/uL   Immature Granulocytes 0 Not Estab. %   Immature Grans (Abs) 0.0 0.0 - 0.1 x10E3/uL  Comprehensive metabolic panel  Result Value Ref Range   Glucose 90 70 - 99 mg/dL   BUN 18 6 - 24 mg/dL   Creatinine, Ser 0.78 0.57 - 1.00 mg/dL   eGFR 88 >59 mL/min/1.73   BUN/Creatinine Ratio 23 9 - 23   Sodium 138 134 - 144 mmol/L   Potassium 4.8 3.5 - 5.2 mmol/L   Chloride 103 96 - 106 mmol/L   CO2 27 20 - 29 mmol/L   Calcium 9.9 8.7 - 10.2 mg/dL   Total Protein  6.8 6.0 - 8.5 g/dL   Albumin 4.6 3.8 - 4.9 g/dL   Globulin, Total 2.2 1.5 - 4.5 g/dL   Albumin/Globulin Ratio 2.1 1.2 - 2.2   Bilirubin Total 0.2 0.0 - 1.2 mg/dL   Alkaline Phosphatase 84 44 - 121 IU/L   AST 18 0 - 40  IU/L   ALT 13 0 - 32 IU/L  Lipid panel  Result Value Ref Range   Cholesterol, Total 193 100 - 199 mg/dL   Triglycerides 55 0 - 149 mg/dL   HDL 75 >39 mg/dL   VLDL Cholesterol Cal 10 5 - 40 mg/dL   LDL Chol Calc (NIH) 108 (H) 0 - 99 mg/dL   Chol/HDL Ratio 2.6 0.0 - 4.4 ratio  Hemoglobin A1c  Result Value Ref Range   Hgb A1c MFr Bld 6.0 (H) 4.8 - 5.6 %   Est. average glucose Bld gHb Est-mCnc 126 mg/dL  TSH  Result Value Ref Range   TSH 2.370 0.450 - 4.500 uIU/mL  VITAMIN D 25 Hydroxy (Vit-D Deficiency, Fractures)  Result Value Ref Range   Vit D, 25-Hydroxy 34.2 30.0 - 100.0 ng/mL      Assessment & Plan:   Problem List Items Addressed This Visit     CLL (chronic lymphocytic leukemia) ([facility]) (Chronic)    Currently followed by oncology. Doing well. Will obtain labs today for evaluation and monitoring.        Encounter for annual physical exam - Primary    CPE and pap today with no abnormal findings.  UTD on recommended HM.  Labs today Will plan to F/U in 1 year with CPE or sooner based on labs      Relevant Orders   CBC with Differential/Platelet (Completed)   Comprehensive metabolic panel (Completed)   Lipid panel (Completed)   Hemoglobin A1c (Completed)   TSH (Completed)   VITAMIN D 25 Hydroxy (Vit-D Deficiency, Fractures) (Completed)   Cervicovaginal ancillary only   BMI 37.0-37.9, adult    Concerns with increased weight gain and difficulty losing weight despite diet and activity.  Has used phentermine in the past with success.  Discussed option to trial phentermine again to see if this is helpful. Will get labs today to evaluate for underlying condition that could be contributing to weight gain and make changes to plan of care as necessary based on findings.  F/U in 3 months for weight check and monitoring of medication.       Relevant Medications   phentermine 37.5 MG capsule   Other Relevant Orders   CBC with Differential/Platelet (Completed)   Comprehensive  metabolic panel (Completed)   Lipid panel (Completed)   Hemoglobin A1c (Completed)   TSH (Completed)   VITAMIN D 25 Hydroxy (Vit-D Deficiency, Fractures) (Completed)   Cervicovaginal ancillary only   Other Visit Diagnoses     Papanicolaou smear for cervical cancer screening       Relevant Orders   Cervicovaginal ancillary only   Encounter for gynecological examination without abnormal finding              IMMUNIZATIONS:   - Tdap: Tetanus vaccination status reviewed: last tetanus booster within 10 years. - Influenza: Up to date - Pneumovax: Not applicable - Prevnar: Not applicable - HPV: Not applicable - Zostavax vaccine:  unknown  SCREENING: - Pap smear: pap done - STI testing: deferred -Mammogram: Up to date  - Colonoscopy: Up to date  - Bone Density: Not applicable  -Hearing Test: Not applicable  -Spirometry: Not applicable  Follow up plan: Return in 3 months (on 03/22/2022) for Weight Loss management.  NEXT PREVENTATIVE PHYSICAL DUE IN 1 YEAR.  PATIENT COUNSELING PROVIDED:   For all adult patients, I recommend   A well balanced diet low in saturated fats, cholesterol, and moderation in carbohydrates.   This can be as simple as monitoring portion sizes and cutting back on sugary beverages such as soda and juice to start with.    Daily water consumption of at least 64 ounces.  Physical activity at least 180 minutes per week, if just starting out.   This can be as simple as taking the stairs instead of the elevator and walking 2-3 laps around the office  purposefully every day.   STD protection, partner selection, and regular testing if high risk.  Limited consumption of alcoholic beverages if alcohol is consumed.  For women, I recommend no more than 7 alcoholic beverages per week, spread out throughout the week.  Avoid "binge" drinking or consuming large quantities of alcohol in one setting.   Please let me know if you feel you may need help with reduction or  quitting alcohol consumption.   Avoidance of nicotine, if used.  Please let me know if you feel you may need help with reduction or quitting nicotine use.   Daily mental health attention.  This can be in the form of 5 minute daily meditation, prayer, journaling, yoga, reflection, etc.   Purposeful attention to your emotions and mental state can significantly improve your overall wellbeing and Health.  Please know that I am here to help you with all of your health care goals and am happy to work with you to find a solution that works best for you.  The greatest advice I have received with any changes in life are to take it one step at a time, that even means if all you can focus on is the next 60 seconds, then do that and celebrate your victories.  With any changes in life, you will have set backs, and that is OK. The important thing to remember is, if you have a set back, it is not a failure, it is an opportunity to try again!  Health Maintenance Recommendations Screening Testing Mammogram Every 1 -2 years based on history and risk factors Starting at age 80 Pap Smear Ages 21-39 every 3 years Ages 25-65 every 5 years with HPV testing More frequent testing may be required based on results and history Colon Cancer Screening Every 1-10 years based on test performed, risk factors, and history Starting at age 22 Bone Density Screening Every 2-10 years based on history Starting at age 1 for women Recommendations for men differ based on medication usage, history, and risk factors AAA Screening One time ultrasound Men 60-16 years old who have every smoked Lung Cancer Screening Low Dose Lung CT every 12 months Age 60-80 years with a 30 pack-year smoking history who still smoke or who have quit within the last 15 years  Screening Labs Routine  Labs: Complete Blood Count (CBC), Complete Metabolic Panel (CMP), Cholesterol (Lipid Panel) Every 6-12 months based on history and  medications May be recommended more frequently based on current conditions or previous results Hemoglobin A1c Lab Every 3-12 months based on history and previous results Starting at age 77 or earlier with diagnosis of diabetes, high cholesterol, BMI >26, and/or risk factors Frequent monitoring for patients with diabetes to ensure blood sugar control Thyroid Panel (TSH w/ T3 & T4) Every 6 months  based on history, symptoms, and risk factors May be repeated more often if on medication HIV One time testing for all patients 21 and older May be repeated more frequently for patients with increased risk factors or exposure Hepatitis C One time testing for all patients 25 and older May be repeated more frequently for patients with increased risk factors or exposure Gonorrhea, Chlamydia Every 12 months for all sexually active persons 13-24 years Additional monitoring may be recommended for those who are considered high risk or who have symptoms PSA Men 30-57 years old with risk factors Additional screening may be recommended from age 67-69 based on risk factors, symptoms, and history  Vaccine Recommendations Tetanus Booster All adults every 10 years Flu Vaccine All patients 6 months and older every year COVID Vaccine All patients 12 years and older Initial dosing with booster May recommend additional booster based on age and health history HPV Vaccine 2 doses all patients age 7-26 Dosing may be considered for patients over 26 Shingles Vaccine (Shingrix) 2 doses all adults 66 years and older Pneumonia (Pneumovax 23) All adults 70 years and older May recommend earlier dosing based on health history Pneumonia (Prevnar 68) All adults 70 years and older Dosed 1 year after Pneumovax 23  Additional Screening, Testing, and Vaccinations may be recommended on an individualized basis based on family history, health history, risk factors, and/or exposure.

## 2021-12-24 LAB — COMPREHENSIVE METABOLIC PANEL
ALT: 13 IU/L (ref 0–32)
AST: 18 IU/L (ref 0–40)
Albumin/Globulin Ratio: 2.1 (ref 1.2–2.2)
Albumin: 4.6 g/dL (ref 3.8–4.9)
Alkaline Phosphatase: 84 IU/L (ref 44–121)
BUN/Creatinine Ratio: 23 (ref 9–23)
BUN: 18 mg/dL (ref 6–24)
Bilirubin Total: 0.2 mg/dL (ref 0.0–1.2)
CO2: 27 mmol/L (ref 20–29)
Calcium: 9.9 mg/dL (ref 8.7–10.2)
Chloride: 103 mmol/L (ref 96–106)
Creatinine, Ser: 0.78 mg/dL (ref 0.57–1.00)
Globulin, Total: 2.2 g/dL (ref 1.5–4.5)
Glucose: 90 mg/dL (ref 70–99)
Potassium: 4.8 mmol/L (ref 3.5–5.2)
Sodium: 138 mmol/L (ref 134–144)
Total Protein: 6.8 g/dL (ref 6.0–8.5)
eGFR: 88 mL/min/{1.73_m2} (ref >59)

## 2021-12-24 LAB — CBC WITH DIFFERENTIAL/PLATELET
Basophils Absolute: 0.1 10*3/uL (ref 0.0–0.2)
Basos: 1 %
EOS (ABSOLUTE): 0.2 10*3/uL (ref 0.0–0.4)
Eos: 2 %
Hematocrit: 43.6 % (ref 34.0–46.6)
Hemoglobin: 14.1 g/dL (ref 11.1–15.9)
Immature Grans (Abs): 0 10*3/uL (ref 0.0–0.1)
Immature Granulocytes: 0 %
Lymphocytes Absolute: 6.8 10*3/uL — ABNORMAL HIGH (ref 0.7–3.1)
Lymphs: 54 %
MCH: 28.9 pg (ref 26.6–33.0)
MCHC: 32.3 g/dL (ref 31.5–35.7)
MCV: 89 fL (ref 79–97)
Monocytes Absolute: 0.6 10*3/uL (ref 0.1–0.9)
Monocytes: 5 %
Neutrophils Absolute: 4.7 10*3/uL (ref 1.4–7.0)
Neutrophils: 38 %
Platelets: 457 10*3/uL — ABNORMAL HIGH (ref 150–450)
RBC: 4.88 x10E6/uL (ref 3.77–5.28)
RDW: 13.3 % (ref 11.7–15.4)
WBC: 12.5 10*3/uL — ABNORMAL HIGH (ref 3.4–10.8)

## 2021-12-24 LAB — LIPID PANEL
Chol/HDL Ratio: 2.6 ratio (ref 0.0–4.4)
Cholesterol, Total: 193 mg/dL (ref 100–199)
HDL: 75 mg/dL (ref >39)
LDL Chol Calc (NIH): 108 mg/dL — ABNORMAL HIGH (ref 0–99)
Triglycerides: 55 mg/dL (ref 0–149)
VLDL Cholesterol Cal: 10 mg/dL (ref 5–40)

## 2021-12-24 LAB — HEMOGLOBIN A1C
Est. average glucose Bld gHb Est-mCnc: 126 mg/dL
Hgb A1c MFr Bld: 6 % — ABNORMAL HIGH (ref 4.8–5.6)

## 2021-12-24 LAB — TSH: TSH: 2.37 u[IU]/mL (ref 0.450–4.500)

## 2021-12-24 LAB — VITAMIN D 25 HYDROXY (VIT D DEFICIENCY, FRACTURES): Vit D, 25-Hydroxy: 34.2 ng/mL (ref 30.0–100.0)

## 2021-12-25 DIAGNOSIS — Z6837 Body mass index (BMI) 37.0-37.9, adult: Secondary | ICD-10-CM | POA: Insufficient documentation

## 2021-12-25 LAB — CERVICOVAGINAL ANCILLARY ONLY
Comment: NEGATIVE
High risk HPV: NEGATIVE

## 2021-12-25 NOTE — Assessment & Plan Note (Signed)
Concerns with increased weight gain and difficulty losing weight despite diet and activity.  Has used phentermine in the past with success.  Discussed option to trial phentermine again to see if this is helpful. Will get labs today to evaluate for underlying condition that could be contributing to weight gain and make changes to plan of care as necessary based on findings.  F/U in 3 months for weight check and monitoring of medication.

## 2021-12-25 NOTE — Assessment & Plan Note (Signed)
Currently followed by oncology. Doing well. Will obtain labs today for evaluation and monitoring.

## 2021-12-25 NOTE — Assessment & Plan Note (Signed)
CPE and pap today with no abnormal findings.  UTD on recommended HM.  Labs today Will plan to F/U in 1 year with CPE or sooner based on labs

## 2022-01-09 IMAGING — MR MR KNEE*R* W/O CM
6 series · 40 of 40 positions shown · non-contrast
Comparison: None.

CLINICAL DATA: Medial knee pain with posterior pain and swelling

EXAM:
MRI OF THE RIGHT KNEE WITHOUT CONTRAST
TECHNIQUE: Multiplanar, multisequence MR imaging of the knee was performed. No
intravenous contrast was administered.

[Series 6: T2 fat-sat · axial · right · 4.0mm · 0.62mm/px · z∈[-49,+113]mm · 8 of 38 slices shown (1 of 3)]
[im 1/38]
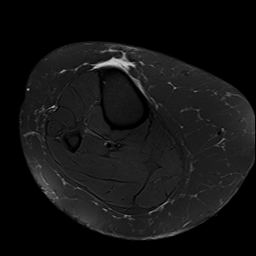
[im 6/38]
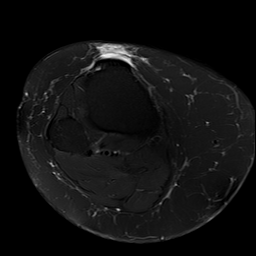
[im 11/38]
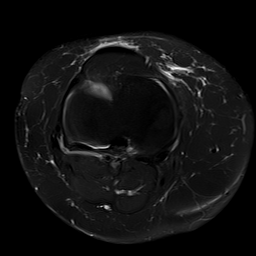
[im 16/38]
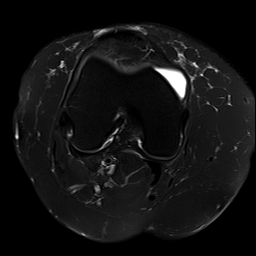
[im 22/38]
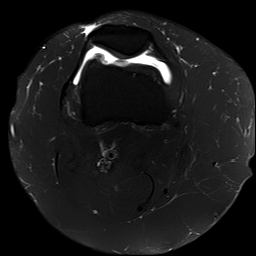
[im 27/38]
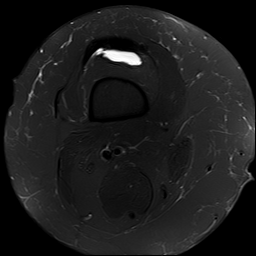
[im 32/38]
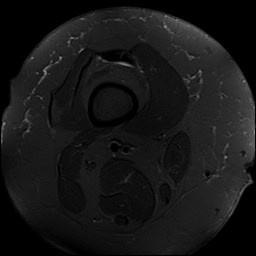
[im 38/38]
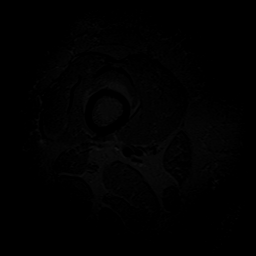

[Series 7: T2 fat-sat · coronal · right · 4.0mm · 0.62mm/px · 6 of 28 slices shown (2 of 3)]
[im 1/28]
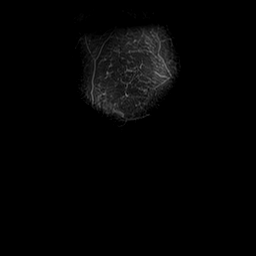
[im 6/28]
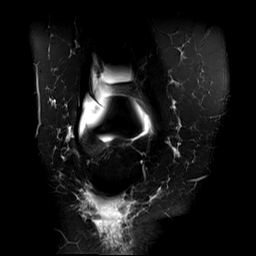
[im 11/28]
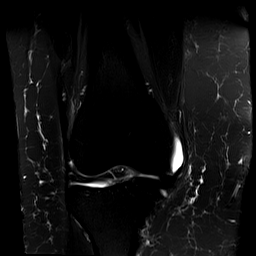
[im 17/28]
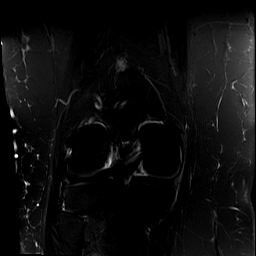
[im 22/28]
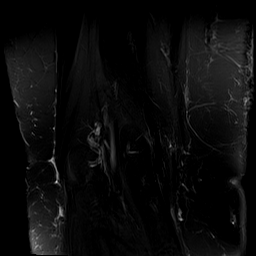
[im 28/28]
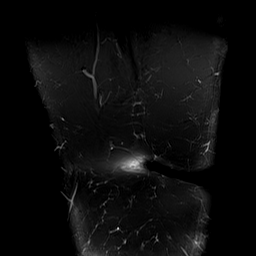

[Series 8: T1 · coronal · right · 4.0mm · 0.62mm/px · 6 of 28 slices shown]
[im 1/28]
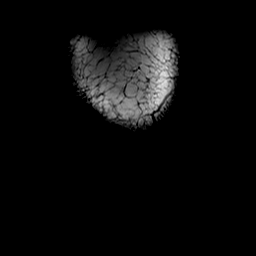
[im 6/28]
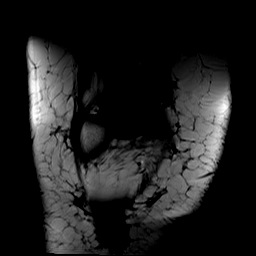
[im 11/28]
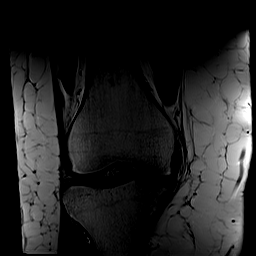
[im 17/28]
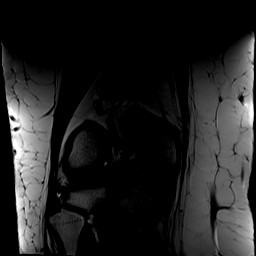
[im 22/28]
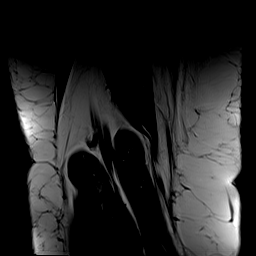
[im 28/28]
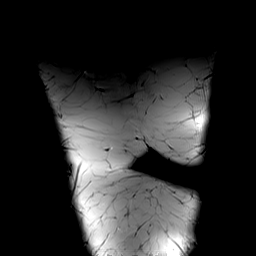

[Series 9: PD fat-sat · coronal · right · 4.0mm · 0.62mm/px · 6 of 28 slices shown (1 of 2)]
[im 1/28]
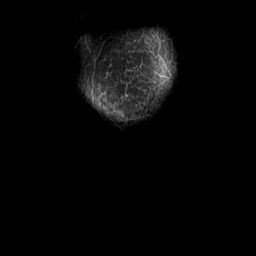
[im 6/28]
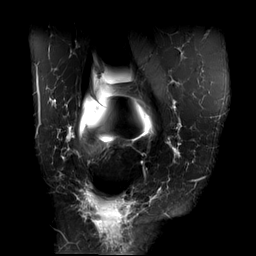
[im 11/28]
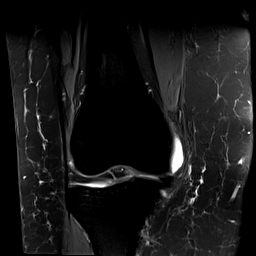
[im 17/28]
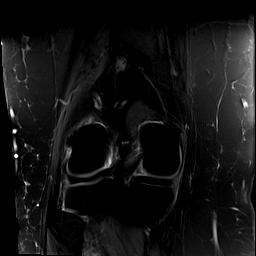
[im 22/28]
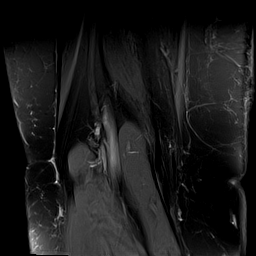
[im 28/28]
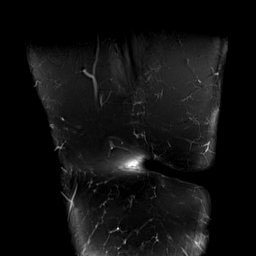

[Series 10: PD fat-sat · sagittal · right · 3.0mm · 0.50mm/px · 7 of 30 slices shown (2 of 2)]
[im 1/30]
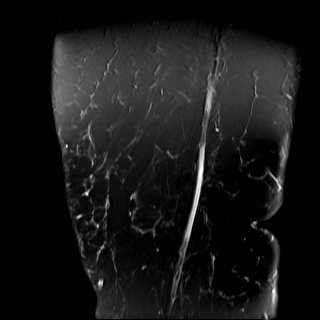
[im 5/30]
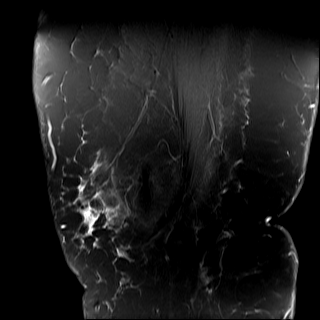
[im 10/30]
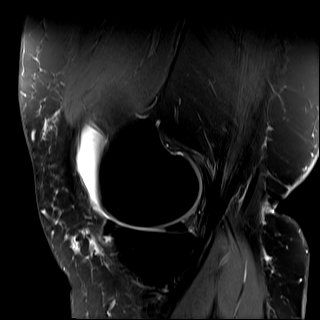
[im 15/30]
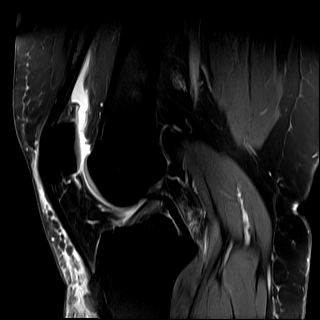
[im 20/30]
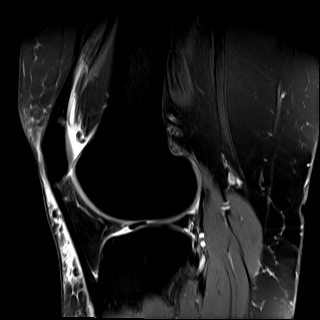
[im 25/30]
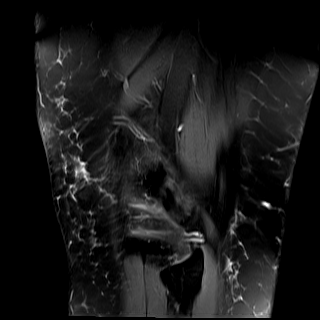
[im 30/30]
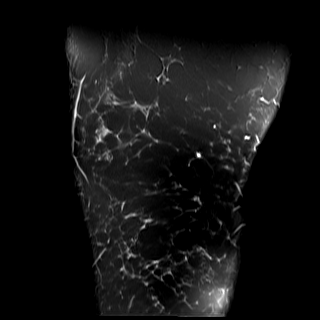

[Series 11: T2 fat-sat · sagittal · right · 3.0mm · 0.50mm/px · 7 of 30 slices shown (3 of 3)]
[im 1/30]
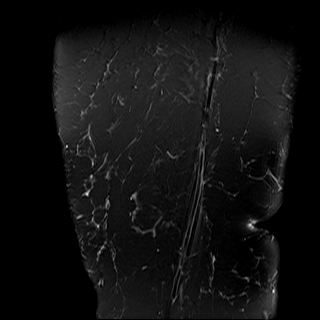
[im 5/30]
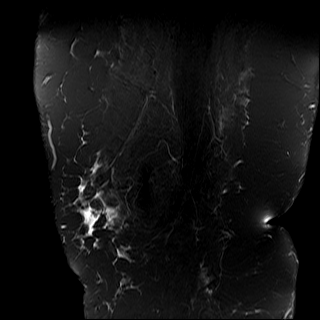
[im 10/30]
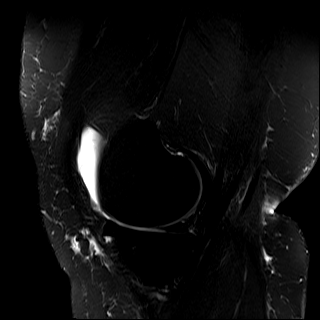
[im 15/30]
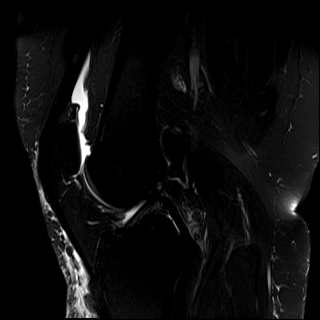
[im 20/30]
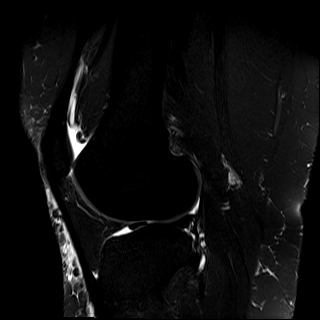
[im 25/30]
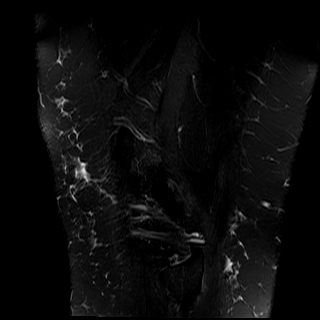
[im 30/30]
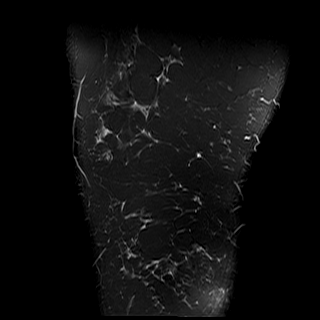

[40 of 40 positions shown; findings below may reference images not displayed]

FINDINGS: MENISCI

Medial: Degeneration of the body and posterior horn of the medial
meniscus with peripheral meniscal extrusion. Oblique tear of the
posterior horn of the medial meniscus extending to the free edge.

Lateral: Intact.

LIGAMENTS

Cruciates: ACL and PCL are intact.

Collaterals: Medial collateral ligament is intact. Lateral
collateral ligament complex is intact.

CARTILAGE

Patellofemoral:  No chondral defect.

Medial: Partial-thickness cartilage loss of the medial femorotibial
compartment.

Lateral:  No chondral defect.

JOINT: Small joint effusion. Normal Atif Stenger. No plical
thickening.

POPLITEAL FOSSA: Popliteus tendon is intact. No Baker's cyst.

EXTENSOR MECHANISM: Intact quadriceps tendon. Intact patellar
tendon. Intact lateral patellar retinaculum. Intact medial patellar
retinaculum. Intact MPFL.

BONES: No aggressive osseous lesion. No fracture or dislocation.

Other: No fluid collection or hematoma. Muscles are normal.
IMPRESSION: 1. Degeneration of the body and posterior horn of the medial
meniscus with peripheral meniscal extrusion. Oblique tear of the
posterior horn of the medial meniscus extending to the free edge.
2. Partial-thickness cartilage loss of the medial femorotibial
compartment.

## 2022-01-14 ENCOUNTER — Ambulatory Visit (HOSPITAL_BASED_OUTPATIENT_CLINIC_OR_DEPARTMENT_OTHER): Payer: Managed Care, Other (non HMO)

## 2022-01-14 ENCOUNTER — Encounter (HOSPITAL_BASED_OUTPATIENT_CLINIC_OR_DEPARTMENT_OTHER): Payer: Self-pay | Admitting: Nurse Practitioner

## 2022-01-14 ENCOUNTER — Other Ambulatory Visit: Payer: Self-pay

## 2022-01-20 ENCOUNTER — Encounter (HOSPITAL_BASED_OUTPATIENT_CLINIC_OR_DEPARTMENT_OTHER): Payer: Self-pay | Admitting: Physical Therapy

## 2022-01-20 ENCOUNTER — Other Ambulatory Visit: Payer: Self-pay

## 2022-01-20 ENCOUNTER — Ambulatory Visit (HOSPITAL_BASED_OUTPATIENT_CLINIC_OR_DEPARTMENT_OTHER): Payer: Commercial Managed Care - HMO | Attending: Family Medicine | Admitting: Physical Therapy

## 2022-01-20 DIAGNOSIS — G8929 Other chronic pain: Secondary | ICD-10-CM | POA: Diagnosis present

## 2022-01-20 DIAGNOSIS — M25512 Pain in left shoulder: Secondary | ICD-10-CM | POA: Diagnosis not present

## 2022-01-20 DIAGNOSIS — M6281 Muscle weakness (generalized): Secondary | ICD-10-CM | POA: Diagnosis present

## 2022-01-20 DIAGNOSIS — M25612 Stiffness of left shoulder, not elsewhere classified: Secondary | ICD-10-CM | POA: Diagnosis present

## 2022-01-20 NOTE — Therapy (Signed)
?OUTPATIENT PHYSICAL THERAPY SHOULDER PROGRESS NOTE ? ? ?Patient Name: Melissa Harding ?MRN: [id number] ?DOB:04-30-1961, 61 y.o., female ?Today's Date: 01/20/2022 ? ? PT End of Session - 01/20/22 0848   ? ? Visit Number 2   ? Number of Visits 19   ? Date for PT Re-Evaluation 03/07/22   ? Authorization Type [employer] Managed   ? PT Start Time [phone]   ? PT Stop Time 0925   ? PT Time Calculation (min) 38 min   ? Activity Tolerance Patient tolerated treatment well   ? Behavior During Therapy [facility] for tasks assessed/performed   ? ?  ?  ? ?  ? ? ? ?Past Medical History:  ?Diagnosis Date  ? Acute medial meniscus tear, right, subsequent encounter 03/11/2021  ? CLL (chronic lymphocytic leukemia) ([place]) 08/07/2020  ? Goals of care, counseling/discussion 08/07/2020  ? MMT (medial meniscus tear)   ? right  ? ?Past Surgical History:  ?Procedure Laterality Date  ? APPENDECTOMY    ? CESAREAN SECTION    ? 2 previous  ? ECTOPIC PREGNANCY SURGERY    ? x2  ? KNEE ARTHROSCOPY Right 03/12/2021  ? Procedure: RIGHT KNEE ARTHROSCOPY WITH PARTIAL MEDIAL MENISCECTOMY;  Surgeon: Mcarthur Rossetti, MD;  Location: [place];  Service: Orthopedics;  Laterality: Right;  ? LAPAROSCOPIC TUBAL LIGATION    ? TUBAL LIGATION    ? ?Patient Active Problem List  ? Diagnosis Date Noted  ? BMI 37.0-37.9, adult 12/25/2021  ? Chronic left shoulder pain 10/22/2021  ? Primary osteoarthritis of right knee 07/21/2021  ? Acute medial meniscus tear, right, subsequent encounter 03/11/2021  ? CLL (chronic lymphocytic leukemia) ([place]) 08/07/2020  ? Encounter for annual physical exam 08/07/2020  ? Personal history of CLL (chronic lymphocytic leukemia) 11/01/2019  ? Lymphocytosis 02/05/2019  ? MENOPAUSAL SYNDROME 03/05/2009  ? ? ?PCP: [name], NP ? ?REFERRING PROVIDER: [name], NP ? ?REFERRING DIAG: M25.512,G89.29 (ICD-10-CM) - Chronic left shoulder pain  ? ?THERAPY DIAG:  ?Chronic left shoulder pain ? ?Stiffness of left shoulder, not elsewhere  classified ? ?Muscle weakness (generalized) ? ? ?ONSET DATE: 12/2020 ? ?SUBJECTIVE:                                                                                                                                                                                     ? ?SUBJECTIVE STATEMENT: ?Pt states the shoulder is moving better but still very stiff and sore first thing in the morning.  ? ? ? ?PERTINENT HISTORY: ?R knee scope ? ?PAIN:  ?Are you having pain? Yes ?NPRS scale: 6/10, Worst 10/10 ?Pain location: lateral shoulder but not past elbow ?Pain  orientation: Left  ?Pain description: aching  ?Aggravating factors: reaching behind back, OH, reaching suddenly, sleeping on the L ?Relieving factors: rest, no sudden movements  ? ?PRECAUTIONS: None ? ?WEIGHT BEARING RESTRICTIONS No ? ?FALLS:  ?Has patient fallen in last 6 months? No Number of falls: 0 ? ?LIVING ENVIRONMENT: ?Lives with: lives with their family and lives with their spouse ?Lives in: House/apartment ? ? 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 ?PLOF: Independent ? ?PATIENT GOALS : improve her ROM and return to normal ? ?OBJECTIVE:  ? ?DIAGNOSTIC FINDINGS:  ?XR L shoulder: 3 views left shoulder show no acute findings ? ?PATIENT SURVEYS:  ?FOTO 57 ?65 at D/C ?10 pts MCII ? ?FOTO 2nd visit 3/8 48 ? ? ? ?UPPER EXTREMITY AROM/PROM:  ? ? ?A/PROM Right ?01/20/2022 Left ?01/20/2022  ?Shoulder flexion WFL 155  ?Shoulder extension WFL 20  ?Shoulder abduction WFL 120  ?Shoulder adduction    ?Shoulder internal rotation WFL L3  ?Shoulder external rotation WFL C7  ?(Blank rows = not tested) ? ?UPPER EXTREMITY MMT: ? ?MMT Right ?01/20/2022 Left ?01/20/2022  ?Shoulder flexion 4+/5 4/5   ?Shoulder extension 4+/5 4/5  ?Shoulder abduction 4+/5 4/5  ?Shoulder adduction    ?Shoulder internal rotation 4+/5 4/5  ?Shoulder external rotation 4+/5 4/5  ?(Blank rows = not tested) ? ?TODAY'S TREATMENT:  ? ?STM: deltoid, infra ?Joint mob: shoulder clocks grade III,  inf mob grade III ? ?Exercises ?Table slide flex and ABD 5s 10x ?Standing Isometric Shoulder External Rotation with Doorway - 2 x daily - 7 x weekly - 1 sets - 10 reps - 3 hold ?Standing Shoulder Row with Anchored Resistance - 2 x daily - 7 x weekly - 2 sets - 10 reps ? ? ?PATIENT EDUCATION: ?Education details:  anatomy, exercise progression, acceptable levels of pain, DOMS expectations, muscle firing,  envelope of function, HEP, POC ? ?Person educated: Patient ?Education method: Explanation, Demonstration, Tactile cues, Verbal cues, and Handouts ?Education comprehension: verbalized understanding, returned demonstration, verbal cues required, and tactile cues required ? ? ?HOME EXERCISE PROGRAM: ?Access Code: [email or web address] ?URL: [email or web address] ?Date: 12/07/2021 ?Prepared by: Daleen Bo ? ? ?ASSESSMENT: ? ?CLINICAL IMPRESSION: ?Pt's shoulder demonstrates significant improvement with AROM as compared to previous session but still has significant pain with movement and shoulder weakness. Pt HEP updated at this time as she seems to do better with PROM vs A/AROM. Pt POC has been affected by recent travel. Plan to schedule as able to in order to progress ROM. Pt with improved ROM following manual, especially STM. Pt advised to continue with self mobilization.  ? ?Objective impairments include decreased ROM, decreased strength, hypomobility, increased muscle spasms, impaired flexibility, impaired UE functional use, improper body mechanics, postural dysfunction, and pain. These impairments are limiting patient from cleaning, community activity, driving, meal prep, occupation, laundry, yard work, and shopping. Personal factors including Age, Fitness, Time since onset of injury/illness/exacerbation, and 1 comorbidity:    are also affecting patient's functional outcome. Patient will benefit from skilled PT to address above impairments and improve overall function. ? ?REHAB POTENTIAL: Good ? ?CLINICAL DECISION  MAKING: Stable/uncomplicated ? ?EVALUATION COMPLEXITY: Low ? ? ?GOALS: ? ? ?SHORT TERM GOALS: ? ?STG Name Target Date Goal status  ?1 Pt will become independent with HEP in order to demonstrate synthesis of PT education. ? ?Baseline:  03/03/2022 INITIAL  ?2 Pt will report at least 2 pt reduction on NPRS scale for pain in order to demonstrate functional improvement with household  activity, self care, and ADL.  ?Baseline:  03/03/2022 INITIAL  ?3 Pt will be able to demonstrate symmetrical BHB reach in order to demonstrate functional improvement in UE function for self-care and house hold duties.  ?Baseline: 03/03/2022 INITIAL  ?4 Pt will score at least 10 pt increase on FOTO to demonstrate functional improvement in MCII and pt perceived function.  ? ?Baseline: 03/03/2022 INITIAL  ? ?LONG TERM GOALS:  ? ?LTG Name Target Date Goal status  ?1 Pt  will become independent with final HEP in order to demonstrate synthesis of PT education. ? ?Baseline: 04/14/2022 INITIAL  ?2 Pt will be able to reach [facility] and carry/hold >3lbs in order to demonstrate functional improvement in L UE strength for return to PLOF and exercise. ? ? ?Baseline: 04/14/2022 INITIAL  ?3 Pt will be able to demonstrate full symmetrical UE ROM in order to demonstrate functional improvement in UE function for return to PLOF. ? ?Baseline: 04/14/2022 INITIAL  ?4 Pt will score >/= 65 on FOTO to demonstrate improvement in L shoulder perceived function.  ?Baseline: 04/14/2022 INITIAL  ? ?PLAN: ?PT FREQUENCY: 1-2x/week ? ?PT DURATION: 12 weeks (likely D/C by 8 weeks) ? ?PLANNED INTERVENTIONS: Therapeutic exercises, Therapeutic activity, Neuro Muscular re-education, Patient/Family education, Joint mobilization, Aquatic Therapy, Dry Needling, Electrical stimulation, Spinal mobilization, Cryotherapy, Moist heat, Compression bandaging, Taping, Vasopneumatic device, Traction, Ultrasound, and Manual therapy ? ?PLAN FOR NEXT SESSION: review HEP, STM to infra, trial crossbody  stretch, iso in different directions ? ?Daleen Bo PT, DPT ?01/20/22 9:29 AM ? ?

## 2022-01-26 ENCOUNTER — Telehealth (HOSPITAL_BASED_OUTPATIENT_CLINIC_OR_DEPARTMENT_OTHER): Payer: Self-pay | Admitting: Nurse Practitioner

## 2022-01-26 ENCOUNTER — Ambulatory Visit (HOSPITAL_BASED_OUTPATIENT_CLINIC_OR_DEPARTMENT_OTHER): Payer: Managed Care, Other (non HMO) | Admitting: Family Medicine

## 2022-01-27 ENCOUNTER — Ambulatory Visit (HOSPITAL_BASED_OUTPATIENT_CLINIC_OR_DEPARTMENT_OTHER): Payer: Commercial Managed Care - HMO | Admitting: Physical Therapy

## 2022-01-27 ENCOUNTER — Other Ambulatory Visit: Payer: Self-pay

## 2022-01-27 ENCOUNTER — Encounter (HOSPITAL_BASED_OUTPATIENT_CLINIC_OR_DEPARTMENT_OTHER): Payer: Self-pay | Admitting: Physical Therapy

## 2022-01-27 DIAGNOSIS — G8929 Other chronic pain: Secondary | ICD-10-CM

## 2022-01-27 DIAGNOSIS — M25612 Stiffness of left shoulder, not elsewhere classified: Secondary | ICD-10-CM

## 2022-01-27 DIAGNOSIS — M6281 Muscle weakness (generalized): Secondary | ICD-10-CM

## 2022-01-27 DIAGNOSIS — M25512 Pain in left shoulder: Secondary | ICD-10-CM | POA: Diagnosis not present

## 2022-01-27 NOTE — Therapy (Addendum)
?OUTPATIENT PHYSICAL THERAPY SHOULDER TREATMENT NOTE ? ? ?Patient Name: Melissa Harding ?MRN: [id number] ?DOB:[birth year], 61 y.o., female ?Today's Date: 01/27/2022 ? ? PT End of Session - 01/27/22 0855   ? ? Visit Number 3   ? Number of Visits 19   ? Date for PT Re-Evaluation 03/07/22   ? Authorization Type [employer] Managed   ? PT Start Time [phone]   arrives late  ? PT Stop Time 0925   ? PT Time Calculation (min) 33 min   ? Activity Tolerance Patient tolerated treatment well   ? Behavior During Therapy [facility] for tasks assessed/performed   ? ?  ?  ? ?  ? ? ? ? ?Past Medical History:  ?Diagnosis Date  ? Acute medial meniscus tear, right, subsequent encounter 03/11/2021  ? CLL (chronic lymphocytic leukemia) ([place]) 08/07/2020  ? Goals of care, counseling/discussion 08/07/2020  ? MMT (medial meniscus tear)   ? right  ? ?Past Surgical History:  ?Procedure Laterality Date  ? APPENDECTOMY    ? CESAREAN SECTION    ? 2 previous  ? ECTOPIC PREGNANCY SURGERY    ? x2  ? KNEE ARTHROSCOPY Right 03/12/2021  ? Procedure: RIGHT KNEE ARTHROSCOPY WITH PARTIAL MEDIAL MENISCECTOMY;  Surgeon: Mcarthur Rossetti, MD;  Location: [place];  Service: Orthopedics;  Laterality: Right;  ? LAPAROSCOPIC TUBAL LIGATION    ? TUBAL LIGATION    ? ?Patient Active Problem List  ? Diagnosis Date Noted  ? BMI 37.0-37.9, adult 12/25/2021  ? Chronic left shoulder pain 10/22/2021  ? Primary osteoarthritis of right knee 07/21/2021  ? Acute medial meniscus tear, right, subsequent encounter 03/11/2021  ? CLL (chronic lymphocytic leukemia) ([place]) 08/07/2020  ? Encounter for annual physical exam 08/07/2020  ? Personal history of CLL (chronic lymphocytic leukemia) 11/01/2019  ? Lymphocytosis 02/05/2019  ? MENOPAUSAL SYNDROME 03/05/2009  ? ? ?PCP: [name], NP ? ?REFERRING PROVIDER: [name], NP ? ?REFERRING DIAG: M25.512,G89.29 (ICD-10-CM) - Chronic left shoulder pain  ? ?THERAPY DIAG:  ?Chronic left shoulder pain ? ?Stiffness of left shoulder, not  elsewhere classified ? ?Muscle weakness (generalized) ? ? ?ONSET DATE: 12/2020 ? ?SUBJECTIVE:                                                                                                                                                                                     ? ?SUBJECTIVE STATEMENT: ?Pt states the shoulder is a little better but has pain with lifting still.   ? ? ? ?PERTINENT HISTORY: ?R knee scope ? ?PAIN:  ?Are you having pain? No ?NPRS scale: 0/10, Worst 10/10 ?Pain location: lateral shoulder but not past elbow ?  Pain orientation: Left  ?Pain description: aching  ?Aggravating factors: reaching behind back, OH, reaching suddenly, sleeping on the L ?Relieving factors: rest, no sudden movements  ? ?PRECAUTIONS: None ? ? ?LIVING ENVIRONMENT: ?Lives with: lives with their family and lives with their spouse ?Lives in: House/apartment ? 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 ?PLOF: Independent ? ?PATIENT GOALS : improve her ROM and return to normal ? ?OBJECTIVE:  ? ?DIAGNOSTIC FINDINGS:  ?XR L shoulder: 3 views left shoulder show no acute findings ? ?PATIENT SURVEYS:  ?FOTO 77 ?65 at D/C ?10 pts MCII ? ?FOTO 2nd visit 3/8 48 ? ? ? ?TODAY'S TREATMENT:  ? ?STM: deltoid, infra, supra, UT on L ?Joint mob: shoulder clocks grade III, inf mob grade II ? ?Exercises ?Pec stretch at 60 deg 30s 2x ?L UT stretch 30s 3x ?Post cuff stretch 20s 3x ?S/L ER 2x10 1lb ?Table slide flex and ABD 5s 10x ?Standing Isometric Shoulder External Rotation with Doorway - 2 x daily - 7 x weekly - 1 sets - 10 reps - 3 hold ?Standing Shoulder Row with Anchored Resistance - 2 x daily - 7 x weekly - 2 sets - 10 reps ? ? ?PATIENT EDUCATION: ?Education details:  anatomy, exercise progression, acceptable levels of pain, DOMS expectations, muscle firing,  envelope of function, HEP, POC ? ?Person educated: Patient ?Education method: Explanation, Demonstration, Tactile cues, Verbal cues, and Handouts ?Education  comprehension: verbalized understanding, returned demonstration, verbal cues required, and tactile cues required ? ? ?HOME EXERCISE PROGRAM: ?Access Code: [email or web address] ?URL: [email or web address] ?Date: 12/07/2021 ?Prepared by: Daleen Bo ? ? ?ASSESSMENT: ? ?CLINICAL IMPRESSION: ?Pt able to progress ROM of L UE at today's session following manual and exercise. Pt is particularly tight into L UT, region of supra, and posterior cuff. Pt was able to reach above 110 in flexion and ABD following session with much less pain. Pt found the most increase following posterior cuff stretching and was able to add gentle ER resistance. Plan to continue with posterior shoulder STM, ROM, and strength at next session. Consider DN PRN.  ? ?Objective impairments include decreased ROM, decreased strength, hypomobility, increased muscle spasms, impaired flexibility, impaired UE functional use, improper body mechanics, postural dysfunction, and pain. These impairments are limiting patient from cleaning, community activity, driving, meal prep, occupation, laundry, yard work, and shopping. Personal factors including Age, Fitness, Time since onset of injury/illness/exacerbation, and 1 comorbidity:    are also affecting patient's functional outcome. Patient will benefit from skilled PT to address above impairments and improve overall function. ? ?REHAB POTENTIAL: Good ? ?CLINICAL DECISION MAKING: Stable/uncomplicated ? ?EVALUATION COMPLEXITY: Low ? ? ?GOALS: ? ? ?SHORT TERM GOALS: ? ?STG Name Target Date Goal status  ?1 Pt will become independent with HEP in order to demonstrate synthesis of PT education. ? ?Baseline:  03/10/2022 MET  ?2 Pt will report at least 2 pt reduction on NPRS scale for pain in order to demonstrate functional improvement with household activity, self care, and ADL.  ?Baseline:  03/10/2022 MET  ?3 Pt will be able to demonstrate symmetrical BHB reach in order to demonstrate functional improvement in UE function  for self-care and house hold duties.  ?Baseline: 03/10/2022 ongoing  ?4 Pt will score at least 10 pt increase on FOTO to demonstrate functional improvement in MCII and pt perceived function.  ? ?Baseline: 03/10/2022 ongoing  ? ?LONG TERM GOALS:  ? ?LTG Name Target Date Goal status  ?1 Pt  will become independent  with final HEP in order to demonstrate synthesis of PT education. ? ?Baseline: 04/21/2022 ongoing  ?2 Pt will be able to reach [facility] and carry/hold >3lbs in order to demonstrate functional improvement in L UE strength for return to PLOF and exercise. ? ? ?Baseline: 04/21/2022 ongoing  ?3 Pt will be able to demonstrate full symmetrical UE ROM in order to demonstrate functional improvement in UE function for return to PLOF. ? ?Baseline: 04/21/2022 ongoing  ?4 Pt will score >/= 65 on FOTO to demonstrate improvement in L shoulder perceived function.  ?Baseline: 04/21/2022 ongoing  ? ?PLAN: ?PT FREQUENCY: 1-2x/week ? ?PT DURATION: 12 weeks (likely D/C by 8 weeks) ? ?PLANNED INTERVENTIONS: Therapeutic exercises, Therapeutic activity, Neuro Muscular re-education, Patient/Family education, Joint mobilization, Aquatic Therapy, Dry Needling, Electrical stimulation, Spinal mobilization, Cryotherapy, Moist heat, Compression bandaging, Taping, Vasopneumatic device, Traction, Ultrasound, and Manual therapy ? ?PLAN FOR NEXT SESSION: review HEP, STM to infra, review post cuff stretching, trial banded ER and pec stretch for home, continue with pec stretching* ? ?Daleen Bo PT, DPT ?01/27/22 9:25 AM ? ?

## 2022-01-29 NOTE — Telephone Encounter (Signed)
Left msg for patient to call office. AS, CMA ?

## 2022-02-02 NOTE — Telephone Encounter (Signed)
Spoke with patient and apologized that we had never reached out with urine results but explained that no labs had been ordered on her urine. Patient was understanding of the staffing issue at [facility]. She had, however, already gone to a different location for UTI symptoms. AS, CMA ?

## 2022-02-03 ENCOUNTER — Ambulatory Visit (HOSPITAL_BASED_OUTPATIENT_CLINIC_OR_DEPARTMENT_OTHER): Payer: Commercial Managed Care - HMO | Admitting: Physical Therapy

## 2022-02-03 ENCOUNTER — Encounter (HOSPITAL_BASED_OUTPATIENT_CLINIC_OR_DEPARTMENT_OTHER): Payer: Self-pay | Admitting: Physical Therapy

## 2022-02-03 ENCOUNTER — Other Ambulatory Visit: Payer: Self-pay

## 2022-02-03 DIAGNOSIS — M25612 Stiffness of left shoulder, not elsewhere classified: Secondary | ICD-10-CM

## 2022-02-03 DIAGNOSIS — M6281 Muscle weakness (generalized): Secondary | ICD-10-CM

## 2022-02-03 DIAGNOSIS — G8929 Other chronic pain: Secondary | ICD-10-CM

## 2022-02-03 DIAGNOSIS — M25512 Pain in left shoulder: Secondary | ICD-10-CM | POA: Diagnosis not present

## 2022-02-03 NOTE — Therapy (Signed)
?OUTPATIENT PHYSICAL THERAPY SHOULDER TREATMENT NOTE ? ? ?Patient Name: Melissa Harding ?MRN: [id number] ?DOB:[birth year], 61 y.o., female ?Today's Date: 02/03/2022 ? ? PT End of Session - 02/03/22 0848   ? ? Visit Number 4   ? Number of Visits 19   ? Date for PT Re-Evaluation 03/07/22   ? Authorization Type [employer] Managed   ? PT Start Time [phone]   ? PT Stop Time 0913   pt requests to end session earlier today  ? PT Time Calculation (min) 26 min   ? Activity Tolerance Patient tolerated treatment well   ? Behavior During Therapy [facility] for tasks assessed/performed   ? ?  ?  ? ?  ? ? ? ? ? ?Past Medical History:  ?Diagnosis Date  ? Acute medial meniscus tear, right, subsequent encounter 03/11/2021  ? CLL (chronic lymphocytic leukemia) ([place]) 08/07/2020  ? Goals of care, counseling/discussion 08/07/2020  ? MMT (medial meniscus tear)   ? right  ? ?Past Surgical History:  ?Procedure Laterality Date  ? APPENDECTOMY    ? CESAREAN SECTION    ? 2 previous  ? ECTOPIC PREGNANCY SURGERY    ? x2  ? KNEE ARTHROSCOPY Right 03/12/2021  ? Procedure: RIGHT KNEE ARTHROSCOPY WITH PARTIAL MEDIAL MENISCECTOMY;  Surgeon: Mcarthur Rossetti, MD;  Location: [place];  Service: Orthopedics;  Laterality: Right;  ? LAPAROSCOPIC TUBAL LIGATION    ? TUBAL LIGATION    ? ?Patient Active Problem List  ? Diagnosis Date Noted  ? BMI 37.0-37.9, adult 12/25/2021  ? Chronic left shoulder pain 10/22/2021  ? Primary osteoarthritis of right knee 07/21/2021  ? Acute medial meniscus tear, right, subsequent encounter 03/11/2021  ? CLL (chronic lymphocytic leukemia) ([place]) 08/07/2020  ? Encounter for annual physical exam 08/07/2020  ? Personal history of CLL (chronic lymphocytic leukemia) 11/01/2019  ? Lymphocytosis 02/05/2019  ? MENOPAUSAL SYNDROME 03/05/2009  ? ? ?PCP: [name], NP ? ?REFERRING PROVIDER: [name], NP ? ?REFERRING DIAG: M25.512,G89.29 (ICD-10-CM) - Chronic left shoulder pain  ? ?THERAPY DIAG:  ?Chronic left shoulder  pain ? ?Muscle weakness (generalized) ? ?Stiffness of left shoulder, not elsewhere classified ? ? ?ONSET DATE: 12/2020 ? ?SUBJECTIVE:                                                                                                                                                                                     ? ?SUBJECTIVE STATEMENT: ?Pt states that there is still pain but lifting it is better. She no longer has pain at rest.  ? ?PERTINENT HISTORY: ?R knee scope ? ?PAIN:  ?Are you having pain? No ?NPRS scale: 0/10, Worst 10/10 ?  Pain location: lateral shoulder but not past elbow ?Pain orientation: Left  ?Pain description: aching  ?Aggravating factors: reaching behind back, OH, reaching suddenly, sleeping on the L ?Relieving factors: rest, no sudden movements  ? ?PRECAUTIONS: None ? ? ?LIVING ENVIRONMENT: ?Lives with: lives with their family and lives with their spouse ?Lives in: House/apartment ? 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 ?PLOF: Independent ? ?PATIENT GOALS : improve her ROM and return to normal ? ?OBJECTIVE:  ? ?DIAGNOSTIC FINDINGS:  ?XR L shoulder: 3 views left shoulder show no acute findings ? ?PATIENT SURVEYS:  ?FOTO 26 ?65 at D/C ?10 pts MCII ? ?FOTO 2nd visit 3/8 48 ? ? ? ?TODAY'S TREATMENT:  ? ?Joint mob: shoulder clocks grade III, inf mob grade II ? ?Exercises ?Table ER 30s 2x ?Pec stretch at 60 deg 30s 2x ?Pulley's 2 min flex and ABD  ?Post cuff stretch 20s 3x ?S/L ER 2x10 1lb ? ? ? ? ?PATIENT EDUCATION: ?Education details:  anatomy, exercise progression, acceptable levels of pain, DOMS expectations, muscle firing,  envelope of function, HEP, POC ? ?Person educated: Patient ?Education method: Explanation, Demonstration, Tactile cues, Verbal cues, and Handouts ?Education comprehension: verbalized understanding, returned demonstration, verbal cues required, and tactile cues required ? ? ?HOME EXERCISE PROGRAM: ?Access Code: [email or web address] ?URL:  [email or web address] ?Date: 12/07/2021 ?Prepared by: Daleen Bo ? ? ?ASSESSMENT: ? ?CLINICAL IMPRESSION: ?Pt session limited by time. Pt able to maintain AROM from previous session but is still significant limited by ER as well as horizontal ABD at this time. Pt finds greater relief with OH stretching doing pulleys/AAROM. Pt able to gain near full flexion but has pain and apprehension with ABD above 120 deg. Pt given edu about how to set up self pulley system at home for maintaining ROM as well as preventing frozen shoulder. Pt given edu about importance of stretching into OH and rotational positions in order to maintain ROM. Pt is continuing to progress towards goals but is pain limited at this time. ? ?Objective impairments include decreased ROM, decreased strength, hypomobility, increased muscle spasms, impaired flexibility, impaired UE functional use, improper body mechanics, postural dysfunction, and pain. These impairments are limiting patient from cleaning, community activity, driving, meal prep, occupation, laundry, yard work, and shopping. Personal factors including Age, Fitness, Time since onset of injury/illness/exacerbation, and 1 comorbidity:    are also affecting patient's functional outcome. Patient will benefit from skilled PT to address above impairments and improve overall function. ? ?REHAB POTENTIAL: Good ? ?CLINICAL DECISION MAKING: Stable/uncomplicated ? ?EVALUATION COMPLEXITY: Low ? ? ?GOALS: ? ? ?SHORT TERM GOALS: ? ?STG Name Target Date Goal status  ?1 Pt will become independent with HEP in order to demonstrate synthesis of PT education. ? ?Baseline:  03/17/2022 MET  ?2 Pt will report at least 2 pt reduction on NPRS scale for pain in order to demonstrate functional improvement with household activity, self care, and ADL.  ?Baseline:  03/17/2022 MET  ?3 Pt will be able to demonstrate symmetrical BHB reach in order to demonstrate functional improvement in UE function for self-care  and house hold duties.  ?Baseline: 03/17/2022 ongoing  ?4 Pt will score at least 10 pt increase on FOTO to demonstrate functional improvement in MCII and pt perceived function.  ? ?Baseline: 03/17/2022 ongoing  ? ?LONG TERM GOALS:  ? ?LTG Name Target Date Goal status  ?1 Pt  will become independent with final HEP in order to demonstrate synthesis of PT education. ? ?Baseline: 04/28/2022  ongoing  ?2 Pt will be able to reach [facility] and carry/hold >3lbs in order to demonstrate functional improvement in L UE strength for return to PLOF and exercise. ? ? ?Baseline: 04/28/2022 ongoing  ?3 Pt will be able to demonstrate full symmetrical UE ROM in order to demonstrate functional improvement in UE function for return to PLOF. ? ?Baseline: 04/28/2022 ongoing  ?4 Pt will score >/= 65 on FOTO to demonstrate improvement in L shoulder perceived function.  ?Baseline: 04/28/2022 ongoing  ? ?PLAN: ?PT FREQUENCY: 1-2x/week ? ?PT DURATION: 12 weeks (likely D/C by 8 weeks) ? ?PLANNED INTERVENTIONS: Therapeutic exercises, Therapeutic activity, Neuro Muscular re-education, Patient/Family education, Joint mobilization, Aquatic Therapy, Dry Needling, Electrical stimulation, Spinal mobilization, Cryotherapy, Moist heat, Compression bandaging, Taping, Vasopneumatic device, Traction, Ultrasound, and Manual therapy ? ?PLAN FOR NEXT SESSION: review HEP, STM to infra, review post cuff stretching, trial banded ER and pec stretch for home, continue with pec stretching* ? ?Daleen Bo PT, DPT ?02/03/22 9:17 AM ? ?

## 2022-02-09 ENCOUNTER — Encounter (HOSPITAL_BASED_OUTPATIENT_CLINIC_OR_DEPARTMENT_OTHER): Payer: Self-pay | Admitting: Family Medicine

## 2022-02-09 ENCOUNTER — Other Ambulatory Visit: Payer: Self-pay

## 2022-02-09 ENCOUNTER — Ambulatory Visit (INDEPENDENT_AMBULATORY_CARE_PROVIDER_SITE_OTHER): Payer: Managed Care, Other (non HMO) | Admitting: Family Medicine

## 2022-02-09 VITALS — BP 117/72 | HR 86 | Ht 62.0 in | Wt 196.6 lb

## 2022-02-09 DIAGNOSIS — G8929 Other chronic pain: Secondary | ICD-10-CM

## 2022-02-09 DIAGNOSIS — M25512 Pain in left shoulder: Secondary | ICD-10-CM

## 2022-02-09 NOTE — Patient Instructions (Signed)
?  Medication Instructions:  ?Your physician recommends that you continue on your current medications as directed. Please refer to the Current Medication list given to you today. ?--If you need a refill on any your medications before your next appointment, please call your pharmacy first. If no refills are authorized on file call the office.-- ? ? ? ? ?Follow-Up: ?Your next appointment:   ?Your physician recommends that you schedule a follow-up appointment in: 4 month follow up ortho left shoulder with Dr. de [place] ? ?You will receive a text message or e-mail with a link to a survey about your care and experience with [place] today! We would greatly appreciate your feedback!  ? ?Thanks for letting us be apart of your health journey!!  ?Primary Care and Sports Medicine  ? ?Dr. Kyung Rudd de [place]  ? ?We encourage you to activate your patient portal called "MyChart".  Sign up information is provided on this After Visit Summary.  MyChart is used to connect with patients for Virtual Visits (Telemedicine).  Patients are able to view lab/test results, encounter notes, upcoming appointments, etc.  Non-urgent messages can be sent to your provider as well. To learn more about what you can do with MyChart, please visit --  [email or web address].    ?

## 2022-02-09 NOTE — Assessment & Plan Note (Signed)
Patient presents for follow-up of chronic left shoulder pain related to adhesive capsulitis.  About 2 months ago, ultrasound-guided glenohumeral joint injection was completed.  She has been doing well since that time.  She has also started back with physical therapy, home exercise program.  Generally she does feel that pain is improved since last visit, still with some restriction in range of motion.  She does not have any specific questions or concerns today. ?On exam, patient still with some restriction in range of motion, particularly with external rotation, abduction.  Range of motion is restricted both with passive and active testing. ?Discussed with patient that improvement in range of motion will take time, will need to continue with physical therapy, be diligent with home exercise program.  She has had some pain relief since injection which is reassuring and should hopefully allow her to continue with physical therapy which is ultimately going to be most beneficial ?Plan for follow-up in about 4 months to monitor progress ?

## 2022-02-09 NOTE — Progress Notes (Signed)
? ? ?  Procedures performed today:   ? ?None. ? ?Independent interpretation of notes and tests performed by another provider:  ? ?None. ? ?Brief History, Exam, Impression, and Recommendations:   ? ?BP 117/72   Pulse 86   Ht '5\' 2"'$  (1.575 m)   Wt 196 lb 9.6 oz (89.2 kg)   SpO2 96%   BMI 35.96 kg/m?  ? ?Chronic left shoulder pain ?Patient presents for follow-up of chronic left shoulder pain related to adhesive capsulitis.  About 2 months ago, ultrasound-guided glenohumeral joint injection was completed.  She has been doing well since that time.  She has also started back with physical therapy, home exercise program.  Generally she does feel that pain is improved since last visit, still with some restriction in range of motion.  She does not have any specific questions or concerns today. ?On exam, patient still with some restriction in range of motion, particularly with external rotation, abduction.  Range of motion is restricted both with passive and active testing. ?Discussed with patient that improvement in range of motion will take time, will need to continue with physical therapy, be diligent with home exercise program.  She has had some pain relief since injection which is reassuring and should hopefully allow her to continue with physical therapy which is ultimately going to be most beneficial ?Plan for follow-up in about 4 months to monitor progress ? ? ?___________________________________________ ?Beautifull Cisar de [place], MD, ABFM, CAQSM ?Primary Care and Sports Medicine ?[place] ?

## 2022-02-10 ENCOUNTER — Ambulatory Visit (HOSPITAL_BASED_OUTPATIENT_CLINIC_OR_DEPARTMENT_OTHER): Payer: Commercial Managed Care - HMO | Admitting: Physical Therapy

## 2022-02-16 ENCOUNTER — Ambulatory Visit (INDEPENDENT_AMBULATORY_CARE_PROVIDER_SITE_OTHER): Payer: Managed Care, Other (non HMO) | Admitting: Nurse Practitioner

## 2022-02-16 ENCOUNTER — Encounter (HOSPITAL_BASED_OUTPATIENT_CLINIC_OR_DEPARTMENT_OTHER): Payer: Self-pay | Admitting: Nurse Practitioner

## 2022-02-16 ENCOUNTER — Ambulatory Visit
Admission: RE | Admit: 2022-02-16 | Discharge: 2022-02-16 | Disposition: A | Discharge status: Home / Self Care | Payer: Managed Care, Other (non HMO) | Source: Ambulatory Visit | Attending: Nurse Practitioner | Admitting: Nurse Practitioner

## 2022-02-16 DIAGNOSIS — N950 Postmenopausal bleeding: Secondary | ICD-10-CM | POA: Insufficient documentation

## 2022-02-16 DIAGNOSIS — M545 Low back pain, unspecified: Secondary | ICD-10-CM

## 2022-02-16 NOTE — Patient Instructions (Signed)
I have sent the order for the transvaginal and pelvic ultrasound to [facility] located inside [facility].  ? ?They will call you to schedule this. As soon as we have these results I will be in touch with you and we can come up with a plan.  ? ?Let me know if you have any questions or concerns.  ?

## 2022-02-16 NOTE — Progress Notes (Signed)
Virtual Visit Encounter telephone visit. ? ? ?I connected with  Melissa Harding on 02/16/22 at  9:30 AM EDT by secure audio telemedicine application. I verified that I am speaking with the correct person using two identifiers. ?  ?I introduced myself as a Designer, jewellery with the practice. The limitations of evaluation and management by telemedicine discussed with the patient and the availability of in person appointments. The patient expressed verbal understanding and consent to proceed. ? ?Participating parties in this visit include: Myself and patient ? ?The patient is: Patient Location: Home ?I am: Provider Location: Office/Clinic ?Subjective:   ? ?CC and HPI: Melissa Harding is a 61 y.o. year old female presenting for follow up of low back pain on the left side.  ?Adamae has had ongoing left sided low back pain without sciatica for about a month now. She has had her urine tested, which was negative for any bacteria and no growth on the urine culture. ?She tells me the other day she had brown vaginal discharge for about a day. This went away on it's own, but was concerned to her as she is post-menopausal for several years. She did have this happen once several years ago, but was not seen for this.  ? ?Her symptoms are: left sided low back pain and frequent urination.  ?She is not having any pelvic pain or pressure, pelvic heaviness, dysuria, unintentional weight loss, nausea, vomiting, changes in bowel habits, or night sweats.  ? ?She was researching her symptoms on the internet and was concerned for possible vaginal atrophy.  ? ?Past medical history, Surgical history, Family history not pertinant except as noted below, Social history, Allergies, and medications have been entered into the medical record, reviewed, and corrections made.  ? ?Review of Systems:  ?All review of systems negative except what is listed in the HPI ? ?Objective:   ? ?Alert and oriented x 4 ?Speaking in clear sentences with no shortness  of breath. ?No distress. ? ?Impression and Recommendations:   ? ?Problem List Items Addressed This Visit   ? ? Post-menopausal bleeding - Primary  ?  Brown vaginal discharge x 1 day in post-menopausal female with frequent urination and low back pain without sciatica.  ?Discussed that symptoms could be related to vaginal atrophy, however, they could be more serious.  ?Recommend pelvic and transvaginal US to help rule out concerning findings.  ?She does not have a GYN, so we will plan to start this process and determine need for referral based on results.  ?If normal, we can treat for vaginal atrophy and monitor symptoms.  ?  ?  ? Relevant Orders  ? US PELVIC COMPLETE WITH TRANSVAGINAL  ? Acute left-sided low back pain without sciatica  ?  Low back pain in the setting of post-menopausal female with new onset of 1 day brown vaginal discharge.  ?UA and culture negative.  ?Will send for pelvic/transvaginal US. Plan to follow-up with results and make changes to plan of care as needed.  ? ?  ?  ? Relevant Orders  ? US PELVIC COMPLETE WITH TRANSVAGINAL  ? ? ?orders and follow up as documented in EMR ?I discussed the assessment and treatment plan with the patient. The patient was provided an opportunity to ask questions and all were answered. The patient agreed with the plan and demonstrated an understanding of the instructions. ?  ?The patient was advised to call back or seek an in-person evaluation if the symptoms worsen or if the condition  fails to improve as anticipated. ? ?Follow-Up: TBD ? ?I provided 19 minutes of non-face-to-face interaction with this non face-to-face encounter including intake, same-day documentation, and chart review.  ? ?[name], NP , DNP, AGNP-c ?[facility] ?Primary Care & Sports Medicine at [facility] ?[phone] ?[phone] (fax) ? ?

## 2022-02-16 NOTE — Assessment & Plan Note (Addendum)
Low back pain in the setting of post-menopausal female with new onset of 1 day brown vaginal discharge.  ?UA and culture negative.  ?Will send for pelvic/transvaginal US. Plan to follow-up with results and make changes to plan of care as needed.  ? ?

## 2022-02-16 NOTE — Assessment & Plan Note (Signed)
Brown vaginal discharge x 1 day in post-menopausal female with frequent urination and low back pain without sciatica.  ?Discussed that symptoms could be related to vaginal atrophy, however, they could be more serious.  ?Recommend pelvic and transvaginal US to help rule out concerning findings.  ?She does not have a GYN, so we will plan to start this process and determine need for referral based on results.  ?If normal, we can treat for vaginal atrophy and monitor symptoms.  ?

## 2022-02-17 ENCOUNTER — Ambulatory Visit (HOSPITAL_BASED_OUTPATIENT_CLINIC_OR_DEPARTMENT_OTHER): Payer: Commercial Managed Care - HMO | Attending: Family Medicine | Admitting: Physical Therapy

## 2022-02-17 ENCOUNTER — Encounter (HOSPITAL_BASED_OUTPATIENT_CLINIC_OR_DEPARTMENT_OTHER): Payer: Self-pay | Admitting: Physical Therapy

## 2022-02-17 DIAGNOSIS — M6281 Muscle weakness (generalized): Secondary | ICD-10-CM | POA: Insufficient documentation

## 2022-02-17 DIAGNOSIS — M25612 Stiffness of left shoulder, not elsewhere classified: Secondary | ICD-10-CM | POA: Diagnosis present

## 2022-02-17 DIAGNOSIS — G8929 Other chronic pain: Secondary | ICD-10-CM | POA: Insufficient documentation

## 2022-02-17 DIAGNOSIS — M25512 Pain in left shoulder: Secondary | ICD-10-CM | POA: Insufficient documentation

## 2022-02-17 NOTE — Therapy (Addendum)
?OUTPATIENT PHYSICAL THERAPY SHOULDER TREATMENT NOTE ? ? ?Patient Name: Melissa Harding ?MRN: [id number] ?DOB:1961-09-28, 61 y.o., female ?Today's Date: 02/17/2022 ? ? PT End of Session - 02/17/22 0907   ? ? Visit Number 5   ? Number of Visits 19   ? Date for PT Re-Evaluation 03/07/22   ? Authorization Type Cigna Managed   ? PT Start Time 0845   ? PT Stop Time 0925   ? PT Time Calculation (min) 40 min   ? Activity Tolerance Patient tolerated treatment well   ? Behavior During Therapy [facility] for tasks assessed/performed   ? ?  ?  ? ?  ? ? ? ? ? ? ?Past Medical History:  ?Diagnosis Date  ? Acute medial meniscus tear, right, subsequent encounter 03/11/2021  ? CLL (chronic lymphocytic leukemia) ([place]) 08/07/2020  ? Goals of care, counseling/discussion 08/07/2020  ? MMT (medial meniscus tear)   ? right  ? ?Past Surgical History:  ?Procedure Laterality Date  ? APPENDECTOMY    ? CESAREAN SECTION    ? 2 previous  ? ECTOPIC PREGNANCY SURGERY    ? x2  ? KNEE ARTHROSCOPY Right 03/12/2021  ? Procedure: RIGHT KNEE ARTHROSCOPY WITH PARTIAL MEDIAL MENISCECTOMY;  Surgeon: Mcarthur Rossetti, MD;  Location: [place];  Service: Orthopedics;  Laterality: Right;  ? LAPAROSCOPIC TUBAL LIGATION    ? TUBAL LIGATION    ? ?Patient Active Problem List  ? Diagnosis Date Noted  ? Post-menopausal bleeding 02/16/2022  ? Acute left-sided low back pain without sciatica 02/16/2022  ? BMI 37.0-37.9, adult 12/25/2021  ? Chronic left shoulder pain 10/22/2021  ? Primary osteoarthritis of right knee 07/21/2021  ? Acute medial meniscus tear, right, subsequent encounter 03/11/2021  ? CLL (chronic lymphocytic leukemia) ([place]) 08/07/2020  ? Encounter for annual physical exam 08/07/2020  ? Personal history of CLL (chronic lymphocytic leukemia) 11/01/2019  ? Lymphocytosis 02/05/2019  ? MENOPAUSAL SYNDROME 03/05/2009  ? ? ?PCP: [name], NP ? ?REFERRING PROVIDER: [name], NP ? ?REFERRING DIAG: M25.512,G89.29 (ICD-10-CM) - Chronic left  shoulder pain  ? ?THERAPY DIAG:  ?Chronic left shoulder pain ? ?Muscle weakness (generalized) ? ?Stiffness of left shoulder, not elsewhere classified ? ? ?ONSET DATE: 12/2020 ? ?SUBJECTIVE:                                                                                                                                                                                     ? ?SUBJECTIVE STATEMENT: ?Pt states that the shoulder was moving much better after last session with the pulleys. She was able to order them for home. She notices the arm has been moving better.  Pt states she "flared" up when making a quick movement trying to shut her car door and cause more pain.  ? ?PERTINENT HISTORY: ?R knee scope ? ?PAIN:  ?Are you having pain? No ?NPRS scale: 0/10, Worst 10/10 ?Pain location: lateral shoulder but not past elbow ?Pain orientation: Left  ?Pain description: aching  ?Aggravating factors: reaching behind back, OH, reaching suddenly, sleeping on the L ?Relieving factors: rest, no sudden movements  ? ?PRECAUTIONS: None ? ? ?LIVING ENVIRONMENT: ?Lives with: lives with their family and lives with their spouse ?Lives in: House/apartment ? 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 ?PLOF: Independent ? ?PATIENT GOALS : improve her ROM and return to normal ? ?OBJECTIVE:  ? ?DIAGNOSTIC FINDINGS:  ?XR L shoulder: 3 views left shoulder show no acute findings ? ?PATIENT SURVEYS:  ?FOTO 74 ?65 at D/C ?10 pts MCII ? ?FOTO 2nd visit 3/8 48 ? ?AROM: flexion 150, ABD 140 ? ?TODAY'S TREATMENT:  ? ?Joint mob: shoulder clocks grade III, inf mob grade II ?STM: ant deltoid/biceps  ? ?Exercises ?Cane extension stretch 5s 10x ?Cane AAROM 3s 10x ?2lb biceps curl slow eccentric 2x10 ?Self TB massage in doorframe  ?Pec stretch at 60 deg 30s 2x ?S/L ER 2x10 2lb ? ? ? ? ?PATIENT EDUCATION: ?Education details:  acceptable levels of pain, exercise progression, muscle firing,  envelope of function, HEP, POC ? ?Person educated:  Patient ?Education method: Explanation, Demonstration, Tactile cues, Verbal cues, and Handouts ?Education comprehension: verbalized understanding, returned demonstration, verbal cues required, and tactile cues required ? ? ?HOME EXERCISE PROGRAM: ?Access Code: [email or web address] ?URL: [email or web address] ?Date: 12/07/2021 ?Prepared by: Daleen Bo ? ? ?ASSESSMENT: ? ?CLINICAL IMPRESSION: ?Pt with good response today to focus on ant shoulder soft tissue restriction. Pt with increased TTP along pec insertion, ant delt,and biceps. Pt found good relief with self massage technique with improvement in ABD ROM prior to having pain at end range.  Pt HEP updated at this time to promote more ER and flexion ROM. Pt is continuing to progress towards goals but is pain limited at this time. ? ?Objective impairments include decreased ROM, decreased strength, hypomobility, increased muscle spasms, impaired flexibility, impaired UE functional use, improper body mechanics, postural dysfunction, and pain. These impairments are limiting patient from cleaning, community activity, driving, meal prep, occupation, laundry, yard work, and shopping. Personal factors including Age, Fitness, Time since onset of injury/illness/exacerbation, and 1 comorbidity:    are also affecting patient's functional outcome. Patient will benefit from skilled PT to address above impairments and improve overall function. ? ?REHAB POTENTIAL: Good ? ?CLINICAL DECISION MAKING: Stable/uncomplicated ? ?EVALUATION COMPLEXITY: Low ? ? ?GOALS: ? ? ?SHORT TERM GOALS: ? ?STG Name Target Date Goal status  ?1 Pt will become independent with HEP in order to demonstrate synthesis of PT education. ? ?Baseline:  03/31/2022 MET  ?2 Pt will report at least 2 pt reduction on NPRS scale for pain in order to demonstrate functional improvement with household activity, self care, and ADL.  ?Baseline:  03/31/2022 MET  ?3 Pt will be able to demonstrate symmetrical BHB reach in order  to demonstrate functional improvement in UE function for self-care and house hold duties.  ?Baseline: 03/31/2022 ongoing  ?4 Pt will score at least 10 pt increase on FOTO to demonstrate functional improvement in MCII and pt perceived function.  ? ?Baseline: 03/31/2022 ongoing  ? ?LONG TERM GOALS:  ? ?LTG Name Target Date Goal status  ?1 Pt  will become independent  with final HEP in order to demonstrate synthesis of PT education. ? ?Baseline: 05/12/2022 ongoing  ?2 Pt will be able to reach [facility] and carry/hold >3lbs in order to demonstrate functional improvement in L UE strength for return to PLOF and exercise. ? ? ?Baseline: 05/12/2022 ongoing  ?3 Pt will be able to demonstrate full symmetrical UE ROM in order to demonstrate functional improvement in UE function for return to PLOF. ? ?Baseline: 05/12/2022 ongoing  ?4 Pt will score >/= 65 on FOTO to demonstrate improvement in L shoulder perceived function.  ?Baseline: 05/12/2022 ongoing  ? ?PLAN: ?PT FREQUENCY: 1-2x/week ? ?PT DURATION: 12 weeks (likely D/C by 8 weeks) ? ?PLANNED INTERVENTIONS: Therapeutic exercises, Therapeutic activity, Neuro Muscular re-education, Patient/Family education, Joint mobilization, Aquatic Therapy, Dry Needling, Electrical stimulation, Spinal mobilization, Cryotherapy, Moist heat, Compression bandaging, Taping, Vasopneumatic device, Traction, Ultrasound, and Manual therapy ? ?PLAN FOR NEXT SESSION: review HEP, STM to infra, review post cuff stretching, trial banded ER and pec stretch for home, continue with pec stretching* ? ?Daleen Bo PT, DPT ?02/17/22 9:27 AM ? ?

## 2022-02-21 ENCOUNTER — Telehealth (HOSPITAL_BASED_OUTPATIENT_CLINIC_OR_DEPARTMENT_OTHER): Payer: Self-pay

## 2022-02-21 ENCOUNTER — Encounter (HOSPITAL_BASED_OUTPATIENT_CLINIC_OR_DEPARTMENT_OTHER): Payer: Self-pay

## 2022-02-21 DIAGNOSIS — N950 Postmenopausal bleeding: Secondary | ICD-10-CM

## 2022-02-21 NOTE — Telephone Encounter (Signed)
Spoke with patient she agreed with the plan for a referral to OB/GYN, referral was sent to Dr Sabra Heck.  ?

## 2022-03-15 ENCOUNTER — Ambulatory Visit (HOSPITAL_BASED_OUTPATIENT_CLINIC_OR_DEPARTMENT_OTHER): Payer: Commercial Managed Care - HMO | Admitting: Physical Therapy

## 2022-03-17 ENCOUNTER — Ambulatory Visit (HOSPITAL_BASED_OUTPATIENT_CLINIC_OR_DEPARTMENT_OTHER): Payer: Commercial Managed Care - HMO | Attending: Family Medicine | Admitting: Physical Therapy

## 2022-03-17 ENCOUNTER — Encounter (HOSPITAL_BASED_OUTPATIENT_CLINIC_OR_DEPARTMENT_OTHER): Payer: Self-pay | Admitting: Physical Therapy

## 2022-03-17 DIAGNOSIS — G8929 Other chronic pain: Secondary | ICD-10-CM | POA: Diagnosis present

## 2022-03-17 DIAGNOSIS — R262 Difficulty in walking, not elsewhere classified: Secondary | ICD-10-CM | POA: Diagnosis present

## 2022-03-17 DIAGNOSIS — M6281 Muscle weakness (generalized): Secondary | ICD-10-CM | POA: Insufficient documentation

## 2022-03-17 DIAGNOSIS — M25512 Pain in left shoulder: Secondary | ICD-10-CM | POA: Diagnosis present

## 2022-03-17 DIAGNOSIS — M25612 Stiffness of left shoulder, not elsewhere classified: Secondary | ICD-10-CM | POA: Insufficient documentation

## 2022-03-17 NOTE — Therapy (Signed)
?OUTPATIENT PHYSICAL THERAPY SHOULDER RE-CERTIFICATION NOTE ? ? ?Patient Name: Melissa Harding ?MRN: [id number] ?DOB:Apr 01, 1961, 61 y.o., female ?Today's Date: 03/17/2022 ? ? PT End of Session - 03/17/22 1427   ? ? Visit Number 6   ? Number of Visits 19   ? Date for PT Re-Evaluation 06/05/22   ? Authorization Type C[employer] Managed   ? PT Start Time 1347   ? PT Stop Time 1031   ? PT Time Calculation (min) 40 min   ? Activity Tolerance Patient tolerated treatment well   ? Behavior During Therapy [facility] for tasks assessed/performed   ? ?  ?  ? ?  ? ? ? ? ? ? ? ?Past Medical History:  ?Diagnosis Date  ? Acute medial meniscus tear, right, subsequent encounter 03/11/2021  ? CLL (chronic lymphocytic leukemia) ([place]) 08/07/2020  ? Goals of care, counseling/discussion 08/07/2020  ? MMT (medial meniscus tear)   ? right  ? ?Past Surgical History:  ?Procedure Laterality Date  ? APPENDECTOMY    ? CESAREAN SECTION    ? 2 previous  ? ECTOPIC PREGNANCY SURGERY    ? x2  ? KNEE ARTHROSCOPY Right 03/12/2021  ? Procedure: RIGHT KNEE ARTHROSCOPY WITH PARTIAL MEDIAL MENISCECTOMY;  Surgeon: Mcarthur Rossetti, MD;  Location: [place];  Service: Orthopedics;  Laterality: Right;  ? LAPAROSCOPIC TUBAL LIGATION    ? TUBAL LIGATION    ? ?Patient Active Problem List  ? Diagnosis Date Noted  ? Post-menopausal bleeding 02/16/2022  ? Acute left-sided low back pain without sciatica 02/16/2022  ? BMI 37.0-37.9, adult 12/25/2021  ? Chronic left shoulder pain 10/22/2021  ? Primary osteoarthritis of right knee 07/21/2021  ? Acute medial meniscus tear, right, subsequent encounter 03/11/2021  ? CLL (chronic lymphocytic leukemia) ([place]) 08/07/2020  ? Encounter for annual physical exam 08/07/2020  ? Personal history of CLL (chronic lymphocytic leukemia) 11/01/2019  ? Lymphocytosis 02/05/2019  ? MENOPAUSAL SYNDROME 03/05/2009  ? ? ?PCP: [name], NP ? ?REFERRING PROVIDER: [name], NP ? ?REFERRING DIAG: M25.512,G89.29 (ICD-10-CM) - Chronic  left shoulder pain  ? ?THERAPY DIAG:  ?Chronic left shoulder pain - Plan: PT plan of care cert/re-cert ? ?Stiffness of left shoulder, not elsewhere classified - Plan: PT plan of care cert/re-cert ? ?Muscle weakness (generalized) - Plan: PT plan of care cert/re-cert ? ? ?ONSET DATE: 12/2020 ? ?SUBJECTIVE:                                                                                                                                                                                     ? ?SUBJECTIVE STATEMENT: ?Pt states that that lifting the arm up is  still difficult but it has improved since last session. ? ?PERTINENT HISTORY: ?R knee scope ? ?PAIN:  ?Are you having pain? No ?NPRS scale: 0/10, Worst 10/10 ?Pain location: lateral shoulder but not past elbow ?Pain orientation: Left  ?Pain description: aching  ?Aggravating factors: reaching behind back, OH, reaching suddenly, sleeping on the L ?Relieving factors: rest, no sudden movements  ? ?PRECAUTIONS: None ? ? ?LIVING ENVIRONMENT: ?Lives with: lives with their family and lives with their spouse ?Lives in: House/apartment ? 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 ?PLOF: Independent ? ?PATIENT GOALS : improve her ROM and return to normal ? ?OBJECTIVE:  ? ?DIAGNOSTIC FINDINGS:  ?XR L shoulder: 3 views left shoulder show no acute findings ? ?PATIENT SURVEYS:  ?FOTO 66 ?65 at D/C ?10 pts MCII ? ?FOTO 2nd visit 3/8 48 ?FOTO 5/3  ? ?AROM: flexion 155, ABD 145 ? ?L shoulder MMT ? ?Flexion 4/5 ?ABD 4/5 p! ?ER 4/5 ?IR 4+/5 ? ?TODAY'S TREATMENT:  ? ?Joint mob: shoulder clocks grade III, inf mob grade II ?STM: ant deltoid/biceps  ? ?Exercises ?Cane extension stretch 5s 10x ?Cane AAROM 3s 10x ?Table ABD, ER, and flexion 3s 2x10 ?Bilat YTB ER 2x10 ?YTB row 2x10 ? ? ? ? ?PATIENT EDUCATION: ?Education details:  exam findings, acceptable levels of pain, exercise progression, muscle firing,  envelope of function, HEP, POC ? ?Person educated:  Patient ?Education method: Explanation, Demonstration, Tactile cues, Verbal cues, and Handouts ?Education comprehension: verbalized understanding, returned demonstration, verbal cues required, and tactile cues required ? ? ?HOME EXERCISE PROGRAM: ?Access Code: [email or web address] ?URL: [email or web address] ?Date: 12/07/2021 ?Prepared by: Daleen Bo ? ? ?ASSESSMENT: ? ?CLINICAL IMPRESSION: ?Pt with good improvement in objective ROM measures and report of ease with reaching but is still pain limited with AROM and OH movements. Pt has improved pain tolerance but continues to demo stiffness about the L GHJ that is consistent with adhesive capsulitis. Pt has improved with pain measures and FOTO but continues to be limited with ADL and self care activity.  ? ?Objective impairments include decreased ROM, decreased strength, hypomobility, increased muscle spasms, impaired flexibility, impaired UE functional use, improper body mechanics, postural dysfunction, and pain. These impairments are limiting patient from cleaning, community activity, driving, meal prep, occupation, laundry, yard work, and shopping. Personal factors including Age, Fitness, Time since onset of injury/illness/exacerbation, and 1 comorbidity:    are also affecting patient's functional outcome. Patient will benefit from skilled PT to address above impairments and improve overall function. ? ?REHAB POTENTIAL: Good ? ?CLINICAL DECISION MAKING: Stable/uncomplicated ? ?EVALUATION COMPLEXITY: Low ? ? ?GOALS: ? ? ?SHORT TERM GOALS: ? ?STG Name Target Date Goal status  ?1 Pt will become independent with HEP in order to demonstrate synthesis of PT education. ? ?Baseline:  04/28/2022 MET  ?2 Pt will report at least 2 pt reduction on NPRS scale for pain in order to demonstrate functional improvement with household activity, self care, and ADL.  ?Baseline:  04/28/2022 MET  ?3 Pt will be able to demonstrate symmetrical BHB reach in order to demonstrate functional  improvement in UE function for self-care and house hold duties.  ?Baseline: 04/28/2022 ongoing  ?4 Pt will score at least 10 pt increase on FOTO to demonstrate functional improvement in MCII and pt perceived function.  ? ?Baseline: 04/28/2022 Partially MET  ? ?LONG TERM GOALS:  ? ?LTG Name Target Date Goal status  ?1 Pt  will become independent with final HEP in order to demonstrate synthesis  of PT education. ? ?Baseline: 06/09/2022 ongoing  ?2 Pt will be able to reach [facility] and carry/hold >3lbs in order to demonstrate functional improvement in L UE strength for return to PLOF and exercise. ? ? ?Baseline: 06/09/2022 Partially met  ?3 Pt will be able to demonstrate full symmetrical UE ROM in order to demonstrate functional improvement in UE function for return to PLOF. ? ?Baseline: 06/09/2022 Partially met  ?4 Pt will score >/= 65 on FOTO to demonstrate improvement in L shoulder perceived function.  ?Baseline: 06/09/2022 ongoing  ? ?PLAN: ?PT FREQUENCY: 1-2x/week ? ?PT DURATION: 12 weeks (likely D/C by 8 weeks) ? ?PLANNED INTERVENTIONS: Therapeutic exercises, Therapeutic activity, Neuro Muscular re-education, Patient/Family education, Joint mobilization, Aquatic Therapy, Dry Needling, Electrical stimulation, Spinal mobilization, Cryotherapy, Moist heat, Compression bandaging, Taping, Vasopneumatic device, Traction, Ultrasound, and Manual therapy ? ?PLAN FOR NEXT SESSION: review HEP, STM to infra, review post cuff stretching, trial banded ER and pec stretch for home, continue with pec stretching* ? ?Daleen Bo PT, DPT ?03/17/22 2:30 PM ? ?

## 2022-03-22 ENCOUNTER — Ambulatory Visit (INDEPENDENT_AMBULATORY_CARE_PROVIDER_SITE_OTHER): Payer: Managed Care, Other (non HMO) | Admitting: Nurse Practitioner

## 2022-03-23 ENCOUNTER — Ambulatory Visit (HOSPITAL_BASED_OUTPATIENT_CLINIC_OR_DEPARTMENT_OTHER): Payer: Commercial Managed Care - HMO | Admitting: Physical Therapy

## 2022-03-25 ENCOUNTER — Encounter (HOSPITAL_BASED_OUTPATIENT_CLINIC_OR_DEPARTMENT_OTHER): Payer: Managed Care, Other (non HMO) | Admitting: Physical Therapy

## 2022-03-26 NOTE — Progress Notes (Signed)
Unable to contact patient for virtual visit. Please schedule visit for repeat evaluation.  ?

## 2022-03-29 ENCOUNTER — Encounter (HOSPITAL_BASED_OUTPATIENT_CLINIC_OR_DEPARTMENT_OTHER): Payer: Managed Care, Other (non HMO) | Admitting: Physical Therapy

## 2022-03-31 ENCOUNTER — Ambulatory Visit (HOSPITAL_BASED_OUTPATIENT_CLINIC_OR_DEPARTMENT_OTHER): Payer: Commercial Managed Care - HMO | Admitting: Physical Therapy

## 2022-03-31 ENCOUNTER — Encounter (HOSPITAL_BASED_OUTPATIENT_CLINIC_OR_DEPARTMENT_OTHER): Payer: Self-pay | Admitting: Physical Therapy

## 2022-03-31 DIAGNOSIS — M25612 Stiffness of left shoulder, not elsewhere classified: Secondary | ICD-10-CM

## 2022-03-31 DIAGNOSIS — M6281 Muscle weakness (generalized): Secondary | ICD-10-CM

## 2022-03-31 DIAGNOSIS — G8929 Other chronic pain: Secondary | ICD-10-CM

## 2022-03-31 DIAGNOSIS — M25512 Pain in left shoulder: Secondary | ICD-10-CM | POA: Diagnosis not present

## 2022-03-31 NOTE — Therapy (Signed)
?OUTPATIENT PHYSICAL THERAPY SHOULDER RE-CERTIFICATION NOTE ? ? ?Patient Name: Melissa Harding ?MRN: [id number] ?DOB:11-19-1960, 61 y.o., female ?Today's Date: 03/31/2022 ? ? PT End of Session - 03/31/22 1412   ? ? Visit Number 7   ? Number of Visits 19   ? Date for PT Re-Evaluation 06/05/22   ? Authorization Type [employer] Managed   ? PT Start Time 1347   ? PT Stop Time 1219   ? PT Time Calculation (min) 40 min   ? Activity Tolerance Patient tolerated treatment well   ? Behavior During Therapy [facility] for tasks assessed/performed   ? ?  ?  ? ?  ? ? ? ? ? ? ? ? ?Past Medical History:  ?Diagnosis Date  ? Acute medial meniscus tear, right, subsequent encounter 03/11/2021  ? CLL (chronic lymphocytic leukemia) ([place]) 08/07/2020  ? Goals of care, counseling/discussion 08/07/2020  ? MMT (medial meniscus tear)   ? right  ? ?Past Surgical History:  ?Procedure Laterality Date  ? APPENDECTOMY    ? CESAREAN SECTION    ? 2 previous  ? ECTOPIC PREGNANCY SURGERY    ? x2  ? KNEE ARTHROSCOPY Right 03/12/2021  ? Procedure: RIGHT KNEE ARTHROSCOPY WITH PARTIAL MEDIAL MENISCECTOMY;  Surgeon: Mcarthur Rossetti, MD;  Location: [place];  Service: Orthopedics;  Laterality: Right;  ? LAPAROSCOPIC TUBAL LIGATION    ? TUBAL LIGATION    ? ?Patient Active Problem List  ? Diagnosis Date Noted  ? Post-menopausal bleeding 02/16/2022  ? Acute left-sided low back pain without sciatica 02/16/2022  ? BMI 37.0-37.9, adult 12/25/2021  ? Chronic left shoulder pain 10/22/2021  ? Primary osteoarthritis of right knee 07/21/2021  ? Acute medial meniscus tear, right, subsequent encounter 03/11/2021  ? CLL (chronic lymphocytic leukemia) ([place]) 08/07/2020  ? Encounter for annual physical exam 08/07/2020  ? Personal history of CLL (chronic lymphocytic leukemia) 11/01/2019  ? Lymphocytosis 02/05/2019  ? MENOPAUSAL SYNDROME 03/05/2009  ? ? ?PCP: [name], NP ? ?REFERRING PROVIDER: [name], NP ? ?REFERRING DIAG: M25.512,G89.29 (ICD-10-CM) -  Chronic left shoulder pain  ? ?THERAPY DIAG:  ?Chronic left shoulder pain ? ?Stiffness of left shoulder, not elsewhere classified ? ?Muscle weakness (generalized) ? ? ?ONSET DATE: 12/2020 ? ?SUBJECTIVE:                                                                                                                                                                                     ? ?SUBJECTIVE STATEMENT: ?Pt states that HEP helps with loosening up the shoulder but end range reaching is still painful.  ? ?PERTINENT HISTORY: ?R knee scope ? ?PAIN:  ?Are you  having pain? No ?NPRS scale: 0/10, Worst 10/10 ?Pain location: lateral shoulder but not past elbow ?Pain orientation: Left  ?Pain description: aching  ?Aggravating factors: reaching behind back, OH, reaching suddenly, sleeping on the L ?Relieving factors: rest, no sudden movements  ? ?PRECAUTIONS: None ? ? ?LIVING ENVIRONMENT: ?Lives with: lives with their family and lives with their spouse ?Lives in: House/apartment ? 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 ?PLOF: Independent ? ?PATIENT GOALS : improve her ROM and return to normal ? ?OBJECTIVE:  ? ?DIAGNOSTIC FINDINGS:  ?XR L shoulder: 3 views left shoulder show no acute findings ? ?PATIENT SURVEYS:  ?FOTO 76 ?65 at D/C ?10 pts MCII ? ?FOTO 2nd visit 3/8 48 ?FOTO 5/3  ? ?AROM: flexion 155, ABD 145 ? ?L shoulder MMT ? ?Flexion 4/5 ?ABD 4/5 p! ?ER 4/5 ?IR 4+/5 ? ?TODAY'S TREATMENT:  ? ?Joint mob: shoulder clocks grade III, inf  and posterior mob grade III ?STM: ant deltoid/biceps  ? ?Exercises ?Cane flexion 3s 10x ?Cane extension stretch with ADD 3s 10x ?Wall angel 2x10 ?Counter/bar flexion stretch 3s 15x ?Wall pec stretch 30s 2x ?Levator/UT stretch 30s 2x each ? ? ? ?PATIENT EDUCATION: ?Education details:  exam findings, acceptable levels of pain, exercise progression, muscle firing,  envelope of function, HEP, POC ? ?Person educated: Patient ?Education method: Explanation, Demonstration,  Tactile cues, Verbal cues, and Handouts ?Education comprehension: verbalized understanding, returned demonstration, verbal cues required, and tactile cues required ? ? ?HOME EXERCISE PROGRAM: ?Access Code: [email or web address] ?URL: [email or web address] ?Date: 12/07/2021 ?Prepared by: Daleen Bo ? ? ?ASSESSMENT: ? ?CLINICAL IMPRESSION: ?Pt able to improve PROM of the L shoulder after manual therapy and exercise at today's session. Pt with >20 deg increased with ABD and flexion. Pt has increased stiffness at beginning of session but able to improve flexibility and mobility with new BW loaded stretching today. Plan to continue with L shoulder ROM as tolerated in order to improve L UE reaching for return to PLOF. Pt does demo T/S stiffness, plan to revisit T/S mobility as well. ? ?Objective impairments include decreased ROM, decreased strength, hypomobility, increased muscle spasms, impaired flexibility, impaired UE functional use, improper body mechanics, postural dysfunction, and pain. These impairments are limiting patient from cleaning, community activity, driving, meal prep, occupation, laundry, yard work, and shopping. Personal factors including Age, Fitness, Time since onset of injury/illness/exacerbation, and 1 comorbidity:    are also affecting patient's functional outcome. Patient will benefit from skilled PT to address above impairments and improve overall function. ? ?REHAB POTENTIAL: Good ? ?CLINICAL DECISION MAKING: Stable/uncomplicated ? ?EVALUATION COMPLEXITY: Low ? ? ?GOALS: ? ? ?SHORT TERM GOALS: ? ?STG Name Target Date Goal status  ?1 Pt will become independent with HEP in order to demonstrate synthesis of PT education. ? ?Baseline:  04/28/2022 MET  ?2 Pt will report at least 2 pt reduction on NPRS scale for pain in order to demonstrate functional improvement with household activity, self care, and ADL.  ?Baseline:  04/28/2022 MET  ?3 Pt will be able to demonstrate symmetrical BHB reach in order to  demonstrate functional improvement in UE function for self-care and house hold duties.  ?Baseline: 04/28/2022 ongoing  ?4 Pt will score at least 10 pt increase on FOTO to demonstrate functional improvement in MCII and pt perceived function.  ? ?Baseline: 04/28/2022 Partially MET  ? ?LONG TERM GOALS:  ? ?LTG Name Target Date Goal status  ?1 Pt  will become independent with final HEP in order  to demonstrate synthesis of PT education. ? ?Baseline: 06/09/2022 ongoing  ?2 Pt will be able to reach [facility] and carry/hold >3lbs in order to demonstrate functional improvement in L UE strength for return to PLOF and exercise. ? ? ?Baseline: 06/09/2022 Partially met  ?3 Pt will be able to demonstrate full symmetrical UE ROM in order to demonstrate functional improvement in UE function for return to PLOF. ? ?Baseline: 06/09/2022 Partially met  ?4 Pt will score >/= 65 on FOTO to demonstrate improvement in L shoulder perceived function.  ?Baseline: 06/09/2022 ongoing  ? ?PLAN: ?PT FREQUENCY: 1-2x/week ? ?PT DURATION: 12 weeks (likely D/C by 8 weeks) ? ?PLANNED INTERVENTIONS: Therapeutic exercises, Therapeutic activity, Neuro Muscular re-education, Patient/Family education, Joint mobilization, Aquatic Therapy, Dry Needling, Electrical stimulation, Spinal mobilization, Cryotherapy, Moist heat, Compression bandaging, Taping, Vasopneumatic device, Traction, Ultrasound, and Manual therapy ? ?PLAN FOR NEXT SESSION: review HEP, STM to infra, review post cuff stretching, trial banded ER and pec stretch for home, continue with pec stretching* ? ?Daleen Bo PT, DPT ?03/31/22 2:27 PM ? ?

## 2022-04-07 ENCOUNTER — Encounter (HOSPITAL_BASED_OUTPATIENT_CLINIC_OR_DEPARTMENT_OTHER): Payer: Self-pay | Admitting: Physical Therapy

## 2022-04-07 ENCOUNTER — Ambulatory Visit (HOSPITAL_BASED_OUTPATIENT_CLINIC_OR_DEPARTMENT_OTHER): Payer: Commercial Managed Care - HMO | Admitting: Physical Therapy

## 2022-04-07 DIAGNOSIS — M25612 Stiffness of left shoulder, not elsewhere classified: Secondary | ICD-10-CM

## 2022-04-07 DIAGNOSIS — R262 Difficulty in walking, not elsewhere classified: Secondary | ICD-10-CM

## 2022-04-07 DIAGNOSIS — M6281 Muscle weakness (generalized): Secondary | ICD-10-CM

## 2022-04-07 DIAGNOSIS — G8929 Other chronic pain: Secondary | ICD-10-CM

## 2022-04-07 DIAGNOSIS — M25512 Pain in left shoulder: Secondary | ICD-10-CM | POA: Diagnosis not present

## 2022-04-07 NOTE — Therapy (Signed)
OUTPATIENT PHYSICAL THERAPY SHOULDER TREATMENT NOTE   Patient Name: Melissa Harding MRN: [id number] DOB:18-Feb-1961, 61 y.o., female Today's Date: 04/07/2022   PT End of Session - 04/07/22 1336     Visit Number 8    Number of Visits 19    Date for PT Re-Evaluation 06/05/22    Authorization Type [employer] Managed    PT Start Time 1301    PT Stop Time 1340    PT Time Calculation (min) 39 min    Activity Tolerance Patient tolerated treatment well    Behavior During Therapy [facility] for tasks assessed/performed                    Past Medical History:  Diagnosis Date   Acute medial meniscus tear, right, subsequent encounter 03/11/2021   CLL (chronic lymphocytic leukemia) ([place]) 08/07/2020   Goals of care, counseling/discussion 08/07/2020   MMT (medial meniscus tear)    right   Past Surgical History:  Procedure Laterality Date   APPENDECTOMY     CESAREAN SECTION     2 previous   ECTOPIC PREGNANCY SURGERY     x2   KNEE ARTHROSCOPY Right 03/12/2021   Procedure: RIGHT KNEE ARTHROSCOPY WITH PARTIAL MEDIAL MENISCECTOMY;  Surgeon: Mcarthur Rossetti, MD;  Location: [place];  Service: Orthopedics;  Laterality: Right;   LAPAROSCOPIC TUBAL LIGATION     TUBAL LIGATION     Patient Active Problem List   Diagnosis Date Noted   Post-menopausal bleeding 02/16/2022   Acute left-sided low back pain without sciatica 02/16/2022   BMI 37.0-37.9, adult 12/25/2021   Chronic left shoulder pain 10/22/2021   Primary osteoarthritis of right knee 07/21/2021   Acute medial meniscus tear, right, subsequent encounter 03/11/2021   CLL (chronic lymphocytic leukemia) ([place]) 08/07/2020   Encounter for annual physical exam 08/07/2020   Personal history of CLL (chronic lymphocytic leukemia) 11/01/2019   Lymphocytosis 02/05/2019   MENOPAUSAL SYNDROME 03/05/2009    PCP: [name], NP  REFERRING PROVIDER: [name], NP  REFERRING DIAG: [phone] (ICD-10-CM) - Chronic  left shoulder pain   THERAPY DIAG:  Chronic left shoulder pain  Stiffness of left shoulder, not elsewhere classified  Muscle weakness (generalized)  Difficulty in walking, not elsewhere classified   ONSET DATE: 12/2020  SUBJECTIVE:                                                                                                                                                                                      SUBJECTIVE STATEMENT: Pt states the shoulder is moving better. She is able to apply deodorant now with greater ease. Pain still occurs with  certain reaching movements at end range.   PERTINENT HISTORY: R knee scope  PAIN:  Are you having pain? 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 ER 4/5 IR 4+/5  TODAY'S TREATMENT:   Joint mob: shoulder clocks grade III, inf  and posterior mob grade III   Exercises T/S open book 2x10 on R Wall slide scaption and ABD 2x10 Seated T/S ext 5s 15x [place] ER standing, 60 deg ABD 5s 10x Standing cane ext 5s 15x      PATIENT EDUCATION: Education details: acceptable levels of pain, exercise progression, muscle firing,  envelope of function, HEP, POC  Person educated:  Patient Education method: Explanation, Demonstration, Tactile cues, Verbal cues, and Handouts Education comprehension: verbalized understanding, returned demonstration, verbal cues required, and tactile cues required   HOME EXERCISE PROGRAM: Access Code: [email or web address] URL: [email or web address] Date: 12/07/2021 Prepared by: Daleen Bo   ASSESSMENT:  CLINICAL IMPRESSION: Pt able to improve PROM of the L shoulder after manual therapy and exercise at today's session. Pt did not have adverse response to T/S mobility exercise and was more stiff with L rotation vs R. Pt able to start stretching in more open pack position of the shoulder. Pt did have slight increase in pain with wall sliding type activity and ER stretching but did not linger. Pt ROM is improving, especially with incorporation of T/S ext mobility. Continue with [facility] and ER stretching as tolerated.  Objective impairments include decreased ROM, decreased strength, hypomobility, increased muscle spasms, impaired flexibility, impaired UE functional use, improper body mechanics, postural dysfunction, and pain. These impairments are limiting patient from cleaning, community activity, driving, meal prep, occupation, laundry, yard work, and shopping. Personal factors including Age, Fitness, Time since onset of injury/illness/exacerbation, and 1 comorbidity:    are also affecting patient's functional outcome. Patient will benefit from skilled PT to address above impairments and improve overall function.  REHAB POTENTIAL: Good  CLINICAL DECISION MAKING: Stable/uncomplicated  EVALUATION COMPLEXITY: Low   GOALS:   SHORT TERM GOALS:  STG Name Target Date Goal status  1 Pt will become independent with HEP in order to demonstrate synthesis of PT education.  Baseline:  04/28/2022 MET  2 Pt will report at least 2 pt reduction on NPRS scale for pain in order to demonstrate functional improvement with household activity, self  care, and ADL.  Baseline:  04/28/2022 MET  3 Pt will be able to demonstrate symmetrical BHB reach in order to demonstrate functional improvement in UE function for self-care and house hold duties.  Baseline: 04/28/2022 ongoing  4 Pt will score at least 10 pt increase on FOTO to demonstrate functional improvement in MCII and pt perceived function.   Baseline: 04/28/2022 Partially MET  LONG TERM GOALS:   LTG Name Target Date Goal status  1 Pt  will become independent with final HEP in order to demonstrate synthesis of PT education.  Baseline: 06/09/2022 ongoing  2 Pt will be able to reach [facility] and carry/hold >3lbs in order to demonstrate functional improvement in L UE strength for return to PLOF and exercise.   Baseline: 06/09/2022 Partially met  3 Pt will be able to demonstrate full symmetrical UE ROM in order to demonstrate functional improvement in UE function for return to PLOF.  Baseline: 06/09/2022 Partially met  4 Pt will score >/= 65 on FOTO to demonstrate improvement in L shoulder perceived function.  Baseline: 06/09/2022 ongoing   PLAN: PT FREQUENCY: 1-2x/week  PT DURATION: 12 weeks (likely D/C by 8 weeks)  PLANNED INTERVENTIONS: Therapeutic exercises, Therapeutic activity, Neuro Muscular re-education, Patient/Family education, Joint mobilization, Aquatic Therapy, Dry Needling, Electrical stimulation, Spinal mobilization, Cryotherapy, Moist heat, Compression bandaging, Taping, Vasopneumatic device, Traction, Ultrasound, and Manual therapy  PLAN FOR NEXT SESSION: review T/S open book, continue with pec stretching, T/S seated ext  Daleen Bo PT, DPT 04/07/22 1:40 PM

## 2022-04-16 ENCOUNTER — Ambulatory Visit (HOSPITAL_BASED_OUTPATIENT_CLINIC_OR_DEPARTMENT_OTHER): Payer: Commercial Managed Care - HMO | Attending: Family Medicine | Admitting: Physical Therapy

## 2022-04-16 DIAGNOSIS — M25512 Pain in left shoulder: Secondary | ICD-10-CM | POA: Insufficient documentation

## 2022-04-16 DIAGNOSIS — M25612 Stiffness of left shoulder, not elsewhere classified: Secondary | ICD-10-CM | POA: Insufficient documentation

## 2022-04-16 DIAGNOSIS — M6281 Muscle weakness (generalized): Secondary | ICD-10-CM | POA: Insufficient documentation

## 2022-04-16 DIAGNOSIS — G8929 Other chronic pain: Secondary | ICD-10-CM | POA: Insufficient documentation

## 2022-04-16 NOTE — Therapy (Incomplete)
OUTPATIENT PHYSICAL THERAPY SHOULDER TREATMENT NOTE   Patient Name: Melissa Harding MRN: [id number] DOB:1961/02/06, 61 y.o., female Today's Date: 04/16/2022            Past Medical History:  Diagnosis Date   Acute medial meniscus tear, right, subsequent encounter 03/11/2021   CLL (chronic lymphocytic leukemia) ([place]) 08/07/2020   Goals of care, counseling/discussion 08/07/2020   MMT (medial meniscus tear)    right   Past Surgical History:  Procedure Laterality Date   APPENDECTOMY     CESAREAN SECTION     2 previous   ECTOPIC PREGNANCY SURGERY     x2   KNEE ARTHROSCOPY Right 03/12/2021   Procedure: RIGHT KNEE ARTHROSCOPY WITH PARTIAL MEDIAL MENISCECTOMY;  Surgeon: Mcarthur Rossetti, MD;  Location: [facility];  Service: Orthopedics;  Laterality: Right;   LAPAROSCOPIC TUBAL LIGATION     TUBAL LIGATION     Patient Active Problem List   Diagnosis Date Noted   Post-menopausal bleeding 02/16/2022   Acute left-sided low back pain without sciatica 02/16/2022   BMI 37.0-37.9, adult 12/25/2021   Chronic left shoulder pain 10/22/2021   Primary osteoarthritis of right knee 07/21/2021   Acute medial meniscus tear, right, subsequent encounter 03/11/2021   CLL (chronic lymphocytic leukemia) ([place]) 08/07/2020   Encounter for annual physical exam 08/07/2020   Personal history of CLL (chronic lymphocytic leukemia) 11/01/2019   Lymphocytosis 02/05/2019   MENOPAUSAL SYNDROME 03/05/2009    PCP: [name], NP  REFERRING PROVIDER: [name], NP  REFERRING DIAG: [phone] (ICD-10-CM) - Chronic left shoulder pain   THERAPY DIAG:  No diagnosis found.   ONSET DATE: 12/2020  SUBJECTIVE:                                                                                                                                                                                      SUBJECTIVE STATEMENT: Pt states the shoulder is moving better. She is able to apply  deodorant now with greater ease. Pain still occurs with certain reaching movements at end range.   PERTINENT HISTORY: R knee scope  PAIN:  Are you having pain? 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 ER 4/5 IR 4+/5  TODAY'S TREATMENT:   Joint mob: shoulder clocks grade III, inf  and posterior mob grade III   Exercises T/S open book 2x10 on R Wall slide scaption and ABD 2x10 Seated T/S ext 5s 15x [place] ER standing, 60 deg ABD 5s 10x Standing cane ext 5s 15x      PATIENT EDUCATION: Education details: acceptable levels of pain, exercise progression, muscle firing,  envelope of function, HEP, POC  Person educated: Patient Education method: Explanation, Demonstration, Tactile cues, Verbal cues, and Handouts Education comprehension: verbalized understanding, returned demonstration, verbal cues required, and tactile cues required   HOME EXERCISE PROGRAM: Access Code: [email or web address] URL: [email or web address] Date: 12/07/2021 Prepared by: Daleen Bo   ASSESSMENT:  CLINICAL IMPRESSION: Pt able to improve PROM of the L shoulder after manual therapy and exercise at today's session. Pt did not have adverse response  to T/S mobility exercise and was more stiff with L rotation vs R. Pt able to start stretching in more open pack position of the shoulder. Pt did have slight increase in pain with wall sliding type activity and ER stretching but did not linger. Pt ROM is improving, especially with incorporation of T/S ext mobility. Continue with [facility] and ER stretching as tolerated.  Objective impairments include decreased ROM, decreased strength, hypomobility, increased muscle spasms, impaired flexibility, impaired UE functional use, improper body mechanics, postural dysfunction, and pain. These impairments are limiting patient from cleaning, community activity, driving, meal prep, occupation, laundry, yard work, and shopping. Personal factors including Age, Fitness, Time since onset of injury/illness/exacerbation, and 1 comorbidity:    are also affecting patient's functional outcome. Patient will benefit from skilled PT to address above impairments and improve overall function.  REHAB POTENTIAL: Good  CLINICAL DECISION MAKING: Stable/uncomplicated  EVALUATION COMPLEXITY: Low   GOALS:   SHORT TERM GOALS:  STG Name Target Date Goal status  1 Pt will become independent with HEP in order to demonstrate synthesis of PT education.  Baseline:  04/28/2022 MET  2 Pt will report at least 2 pt reduction on NPRS scale for pain in order to demonstrate functional improvement with household activity, self care, and ADL.  Baseline:  04/28/2022 MET  3 Pt will be able to demonstrate symmetrical BHB reach in order to demonstrate functional improvement in UE function for self-care and house hold duties.  Baseline: 04/28/2022 ongoing  4 Pt will score at least 10 pt increase on FOTO to demonstrate functional improvement in MCII and pt perceived function.   Baseline: 04/28/2022 Partially MET   LONG TERM GOALS:   LTG Name Target Date Goal status  1 Pt  will become independent with final HEP in order to demonstrate synthesis  of PT education.  Baseline: 06/09/2022 ongoing  2 Pt will be able to reach [facility] and carry/hold >3lbs in order to demonstrate functional improvement in L UE strength for return to PLOF and exercise.   Baseline: 06/09/2022 Partially met  3 Pt will be able to demonstrate full symmetrical UE ROM in order to demonstrate functional improvement in UE function for return to PLOF.  Baseline: 06/09/2022 Partially met  4 Pt will score >/= 65 on FOTO to demonstrate  improvement in L shoulder perceived function.  Baseline: 06/09/2022 ongoing   PLAN: PT FREQUENCY: 1-2x/week  PT DURATION: 12 weeks (likely D/C by 8 weeks)  PLANNED INTERVENTIONS: Therapeutic exercises, Therapeutic activity, Neuro Muscular re-education, Patient/Family education, Joint mobilization, Aquatic Therapy, Dry Needling, Electrical stimulation, Spinal mobilization, Cryotherapy, Moist heat, Compression bandaging, Taping, Vasopneumatic device, Traction, Ultrasound, and Manual therapy  PLAN FOR NEXT SESSION: review T/S open book, continue with pec stretching, T/S seated ext  Daleen Bo PT, DPT 04/16/22 8:32 AM

## 2022-05-10 ENCOUNTER — Encounter (HOSPITAL_BASED_OUTPATIENT_CLINIC_OR_DEPARTMENT_OTHER): Payer: Self-pay | Admitting: Obstetrics & Gynecology

## 2022-05-10 ENCOUNTER — Ambulatory Visit (INDEPENDENT_AMBULATORY_CARE_PROVIDER_SITE_OTHER): Payer: Commercial Managed Care - HMO | Admitting: Obstetrics & Gynecology

## 2022-05-10 ENCOUNTER — Other Ambulatory Visit (HOSPITAL_COMMUNITY)
Admission: RE | Admit: 2022-05-10 | Discharge: 2022-05-10 | Disposition: A | Discharge status: Home / Self Care | Payer: Commercial Managed Care - HMO | Source: Ambulatory Visit | Attending: Obstetrics & Gynecology | Admitting: Obstetrics & Gynecology

## 2022-05-10 ENCOUNTER — Other Ambulatory Visit (HOSPITAL_COMMUNITY)
Admission: RE | Admit: 2022-05-10 | Discharge: 2022-05-10 | Disposition: A | Discharge status: Home / Self Care | Payer: Commercial Managed Care - HMO | Source: Ambulatory Visit

## 2022-05-10 VITALS — BP 117/68 | HR 87 | Ht 62.0 in | Wt 189.4 lb

## 2022-05-10 DIAGNOSIS — Z124 Encounter for screening for malignant neoplasm of cervix: Secondary | ICD-10-CM

## 2022-05-10 DIAGNOSIS — N950 Postmenopausal bleeding: Secondary | ICD-10-CM

## 2022-05-10 DIAGNOSIS — D251 Intramural leiomyoma of uterus: Secondary | ICD-10-CM | POA: Diagnosis not present

## 2022-05-12 LAB — CYTOLOGY - PAP: Diagnosis: NEGATIVE

## 2022-05-12 LAB — SURGICAL PATHOLOGY

## 2022-05-13 ENCOUNTER — Encounter (HOSPITAL_BASED_OUTPATIENT_CLINIC_OR_DEPARTMENT_OTHER): Payer: Self-pay | Admitting: Physical Therapy

## 2022-05-13 ENCOUNTER — Ambulatory Visit (HOSPITAL_BASED_OUTPATIENT_CLINIC_OR_DEPARTMENT_OTHER): Payer: Commercial Managed Care - HMO | Admitting: Physical Therapy

## 2022-05-13 DIAGNOSIS — M25612 Stiffness of left shoulder, not elsewhere classified: Secondary | ICD-10-CM | POA: Diagnosis present

## 2022-05-13 DIAGNOSIS — G8929 Other chronic pain: Secondary | ICD-10-CM | POA: Diagnosis present

## 2022-05-13 DIAGNOSIS — M6281 Muscle weakness (generalized): Secondary | ICD-10-CM

## 2022-05-13 DIAGNOSIS — M25512 Pain in left shoulder: Secondary | ICD-10-CM | POA: Diagnosis present

## 2022-05-13 NOTE — Therapy (Signed)
OUTPATIENT PHYSICAL THERAPY SHOULDER TREATMENT NOTE   Patient Name: Melissa Harding MRN: [id number] DOB:08-Oct-1961, 61 y.o., female Today's Date: 05/13/2022   PT End of Session - 05/13/22 1307     Visit Number 9    Number of Visits 19    Date for PT Re-Evaluation 06/05/22    Authorization Type [employer] Managed    PT Start Time 1302    PT Stop Time 1341    PT Time Calculation (min) 39 min    Activity Tolerance Patient tolerated treatment well    Behavior During Therapy [facility] for tasks assessed/performed                     Past Medical History:  Diagnosis Date   Acute medial meniscus tear, right, subsequent encounter 03/11/2021   CLL (chronic lymphocytic leukemia) ([place]) 08/07/2020   Goals of care, counseling/discussion 08/07/2020   MMT (medial meniscus tear)    right   Past Surgical History:  Procedure Laterality Date   APPENDECTOMY     CESAREAN SECTION     2 previous   ECTOPIC PREGNANCY SURGERY     x2   KNEE ARTHROSCOPY Right 03/12/2021   Procedure: RIGHT KNEE ARTHROSCOPY WITH PARTIAL MEDIAL MENISCECTOMY;  Surgeon: Mcarthur Rossetti, MD;  Location: [place] [facility];  Service: Orthopedics;  Laterality: Right;   LAPAROSCOPIC TUBAL LIGATION     TUBAL LIGATION     Patient Active Problem List   Diagnosis Date Noted   Post-menopausal bleeding 02/16/2022   Acute left-sided low back pain without sciatica 02/16/2022   BMI 37.0-37.9, adult 12/25/2021   Chronic left shoulder pain 10/22/2021   Primary osteoarthritis of right knee 07/21/2021   CLL (chronic lymphocytic leukemia) ([place]) 08/07/2020   Encounter for annual physical exam 08/07/2020   Personal history of CLL (chronic lymphocytic leukemia) 11/01/2019   Lymphocytosis 02/05/2019   MENOPAUSAL SYNDROME 03/05/2009    PCP: [name], NP  REFERRING PROVIDER: [name], NP  REFERRING DIAG: [phone] (ICD-10-CM) - Chronic left shoulder pain   THERAPY DIAG:  Chronic left shoulder  pain  Stiffness of left shoulder, not elsewhere classified  Muscle weakness (generalized)   ONSET DATE: 12/2020  SUBJECTIVE:                                                                                                                                                                                      SUBJECTIVE STATEMENT: Pt states the shoulder is moving better. She is able to apply deodorant now with greater ease. Pain still occurs with certain reaching movements at end range.   PERTINENT HISTORY: R knee scope  PAIN:  Are you having pain? 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   PLOF: Independent  PATIENT GOALS : improve her ROM and return to normal  OBJECTIVE:   DIAGNOSTIC FINDINGS:  XR L shoulder: 3 views left shoulder show no acute findings  PATIENT SURVEYS:  FOTO 49 65 at D/C 10 pts MCII  FOTO 2nd visit 3/8 48 FOTO 5/3   AROM: flexion 155, ABD 150   TODAY'S TREATMENT:   UBE L1 2 min fwd and retro  Exercises Cable straight arm pull down 10lbs 2s stretch at top 2x10 Standing bar cable row 20lbs 2x10 T/S open book 5s 10x   Supine flexion 1lb 2x10 3s hold at end  Wall slide ABD 2x10 Doorway pec stretch arm at 60  Abd 30s 3x Standing cane ext 5s 15x IR strap stretch 10s 10x    PATIENT EDUCATION: Education details: acceptable levels of pain, exercise progression, muscle firing,  envelope of function, HEP, POC  Person educated: Patient Education method: Explanation, Demonstration, Tactile cues, Verbal cues, and  Handouts Education comprehension: verbalized understanding, returned demonstration, verbal cues required, and tactile cues required   HOME EXERCISE PROGRAM: Access Code: [email or web address] URL: [email or web address] Date: 12/07/2021 Prepared by: Daleen Bo   ASSESSMENT:  CLINICAL IMPRESSION: Pt able to improve A/PROM at today's session with greatly improved PROM into OH positions. Pt still has most difficulty with ER and IR BHB reaching positions but flexion and ABD are improving. Pt does have difficulty with shoulder getting fully into frontal plane due to capsular stiffness. Pt has greatly improved since last session with HEP. Plan for 1x/every other week at this time as pt is independent with exercise and progressing well with HEP. No joint mobs indicated today.   Objective impairments include decreased ROM, decreased strength, hypomobility, increased muscle spasms, impaired flexibility, impaired UE functional use, improper body mechanics, postural dysfunction, and pain. These impairments are limiting patient from cleaning, community activity, driving, meal prep, occupation, laundry, yard work, and shopping. Personal factors including Age, Fitness, Time since onset of injury/illness/exacerbation, and 1 comorbidity:    are also affecting patient's functional outcome. Patient will benefit from skilled PT to address above impairments and improve overall function.  REHAB POTENTIAL: Good  CLINICAL DECISION MAKING: Stable/uncomplicated  EVALUATION COMPLEXITY: Low   GOALS:   SHORT TERM GOALS:  STG Name Target Date Goal status  1 Pt will become independent with HEP in order to demonstrate synthesis of PT education.  Baseline:  04/28/2022 MET  2 Pt will report at least 2 pt reduction on NPRS scale for pain in order to demonstrate functional improvement with household activity, self care, and ADL.  Baseline:  04/28/2022 MET  3 Pt will be able to demonstrate symmetrical BHB reach in  order to demonstrate functional improvement in UE function for self-care and house hold duties.  Baseline: 04/28/2022 ongoing  4 Pt will score at least 10 pt increase on FOTO to demonstrate functional improvement in MCII and pt perceived function.   Baseline: 04/28/2022 Partially MET   LONG TERM GOALS:   LTG Name Target Date Goal status  1 Pt  will  become independent with final HEP in order to demonstrate synthesis of PT education.  Baseline: 06/09/2022 ongoing  2 Pt will be able to reach [facility] and carry/hold >3lbs in order to demonstrate functional improvement in L UE strength for return to PLOF and exercise.   Baseline: 06/09/2022 Partially met  3 Pt will be able to demonstrate full symmetrical UE ROM in order to demonstrate functional improvement in UE function for return to PLOF.  Baseline: 06/09/2022 Partially met  4 Pt will score >/= 65 on FOTO to demonstrate improvement in L shoulder perceived function.  Baseline: 06/09/2022 ongoing   PLAN: PT FREQUENCY: 1-2x/week  PT DURATION: 12 weeks (likely D/C by 8 weeks)  PLANNED INTERVENTIONS: Therapeutic exercises, Therapeutic activity, Neuro Muscular re-education, Patient/Family education, Joint mobilization, Aquatic Therapy, Dry Needling, Electrical stimulation, Spinal mobilization, Cryotherapy, Moist heat, Compression bandaging, Taping, Vasopneumatic device, Traction, Ultrasound, and Manual therapy  PLAN FOR NEXT SESSION: review T/S open book, continue with pec stretching, T/S seated ext  Daleen Bo PT, DPT 05/13/22 1:41 PM

## 2022-05-20 ENCOUNTER — Other Ambulatory Visit: Payer: Self-pay | Admitting: Nurse Practitioner

## 2022-05-20 DIAGNOSIS — Z1231 Encounter for screening mammogram for malignant neoplasm of breast: Secondary | ICD-10-CM

## 2022-06-02 ENCOUNTER — Ambulatory Visit
Admission: RE | Admit: 2022-06-02 | Discharge: 2022-06-02 | Disposition: A | Discharge status: Home / Self Care | Payer: Commercial Managed Care - HMO | Source: Ambulatory Visit

## 2022-06-02 DIAGNOSIS — Z1231 Encounter for screening mammogram for malignant neoplasm of breast: Secondary | ICD-10-CM

## 2022-06-11 ENCOUNTER — Ambulatory Visit (HOSPITAL_BASED_OUTPATIENT_CLINIC_OR_DEPARTMENT_OTHER): Payer: Managed Care, Other (non HMO) | Admitting: Nurse Practitioner

## 2022-06-22 ENCOUNTER — Encounter (HOSPITAL_BASED_OUTPATIENT_CLINIC_OR_DEPARTMENT_OTHER): Payer: Self-pay | Admitting: Family Medicine

## 2022-06-22 ENCOUNTER — Ambulatory Visit (INDEPENDENT_AMBULATORY_CARE_PROVIDER_SITE_OTHER): Payer: Commercial Managed Care - HMO | Admitting: Nurse Practitioner

## 2022-06-22 VITALS — BP 126/72 | HR 65 | Temp 98.7°F | Ht 62.0 in | Wt 190.0 lb

## 2022-06-22 DIAGNOSIS — L0291 Cutaneous abscess, unspecified: Secondary | ICD-10-CM | POA: Diagnosis not present

## 2022-06-22 DIAGNOSIS — N3090 Cystitis, unspecified without hematuria: Secondary | ICD-10-CM

## 2022-06-22 DIAGNOSIS — Z6837 Body mass index (BMI) 37.0-37.9, adult: Secondary | ICD-10-CM | POA: Diagnosis not present

## 2022-06-22 MED ORDER — PHENTERMINE HCL 37.5 MG PO CAPS: ORAL_CAPSULE | ORAL | 2 refills | Status: DC

## 2022-06-22 MED ORDER — SULFAMETHOXAZOLE-TRIMETHOPRIM 800-160 MG PO TABS
1.0000 | ORAL_TABLET | Freq: Two times a day (BID) | ORAL | 0 refills | Status: DC
Start: 2022-06-22 — End: 2022-08-02

## 2022-06-22 NOTE — Assessment & Plan Note (Signed)
Her BMI is down to 34.75 today which is an improvement from where it was in February.  Unfortunately she is still having difficulty losing additional weight despite diet and activity.  She has utilized phentermine in the past with success and would like to trial this again for approximately 3 months to see if it will aid in additional weight loss.  Will plan to restart medication today and monitor in approximately 3 months for effectiveness.  PDMP reviewed today.

## 2022-06-22 NOTE — Patient Instructions (Addendum)
I have sent in an antibiotics to help with the urinary symptoms- these will also help to clear the abscess up the rest of the way.  If you feel like the urinary symptoms dont go away or if the abscess does not go away let me know.    I have also send in a refill of the phentermine for you. Let me know when you need refills.

## 2022-06-22 NOTE — Progress Notes (Signed)
[name], DNP, AGNP-c Primary Care & Sports Medicine [address]  [place], [place] [address] [phone]  Subjective:   Melissa Harding is a 61 y.o. female presents to day for UTI.  Original visit note reported for orthopedic follow-up however patient has other needs that she would like addressed today.  Jameela tells me that approximately 3 weeks ago she noticed dysuria and that her urine was cloudy and malodorous.  She tells me that she began to take AZO over-the-counter and the dysuria resolved however cloudiness and odor are still present.  She tells me that the urinary symptoms are worse on the first urine in the morning.  Recently she noticed a small sore on the inner portion of the labia minora.  She tells me that her husband looked at the area and noticed an abscess.  She reports that he was able to drain some of the abscess and the size has gone down considerably.  She tells me that this area is not painful at this time but there is a small amount of edema still present.  She has not had this happen in the past.  She has no risks for STI.  She denies any fevers, chills, tenderness, or warmth to the abscessed area.  At her last visit in April she was seen for postmenopausal bleeding.  She tells me that she has followed up with gynecology and has had a complete workup with no concerning findings.  She has not had any more vaginal bleeding.  Weight management Monquie has been seen in the past for weight management and has successfully utilized phentermine.  She is interested in restarting the phentermine today as she has noticed that her weight is increasing and she is having difficulty managing with diet and exercise alone.   PMH, Medications, and Allergies reviewed and updated in chart.   ROS negative except for what is listed in HPI. Objective:  BP 126/72   Pulse 65   Temp 98.7 F (37.1 C)   Ht '5\' 2"'$  (1.575 m)   Wt 190 lb (86.2 kg)   SpO2  98%   BMI 34.75 kg/m  Physical Exam Vitals and nursing note reviewed.  Constitutional:      General: She is not in acute distress.    Appearance: Normal appearance.  HENT:     Head: Normocephalic.  Eyes:     Extraocular Movements: Extraocular movements intact.     Conjunctiva/sclera: Conjunctivae normal.     Pupils: Pupils are equal, round, and reactive to light.  Neck:     Vascular: No carotid bruit.  Cardiovascular:     Rate and Rhythm: Normal rate and regular rhythm.     Pulses: Normal pulses.     Heart sounds: Normal heart sounds. No murmur heard. Pulmonary:     Effort: Pulmonary effort is normal.     Breath sounds: Normal breath sounds. No wheezing.  Abdominal:     General: Bowel sounds are normal. There is no distension.     Palpations: Abdomen is soft.     Tenderness: There is abdominal tenderness in the suprapubic area. There is no right CVA tenderness, left CVA tenderness or guarding.  Musculoskeletal:        General: Normal range of motion.     Cervical back: Normal range of motion and neck supple.     Right lower leg: No edema.     Left lower leg: No edema.  Lymphadenopathy:  Cervical: No cervical adenopathy.  Skin:    General: Skin is warm and dry.     Capillary Refill: Capillary refill takes less than 2 seconds.  Neurological:     General: No focal deficit present.     Mental Status: She is alert and oriented to person, place, and time.  Psychiatric:        Mood and Affect: Mood normal.        Behavior: Behavior normal.        Thought Content: Thought content normal.        Judgment: Judgment normal.           Assessment & Plan:   Problem List Items Addressed This Visit     BMI 37.0-37.9, adult    Her BMI is down to 34.75 today which is an improvement from where it was in February.  Unfortunately she is still having difficulty losing additional weight despite diet and activity.  She has utilized phentermine in the past with success and would  like to trial this again for approximately 3 months to see if it will aid in additional weight loss.  Will plan to restart medication today and monitor in approximately 3 months for effectiveness.  PDMP reviewed today.      Relevant Medications   phentermine 37.5 MG capsule   Abscess - Primary    Reported recent abscess to the labia minora with drainage at home.  Patient reports that the area is no longer tender however it is still slightly raised.  Given the location and the presence of symptoms we will begin treatment with antibiotic therapy to aid in complete resolution.  Recommend warm compresses to the area to help facilitate natural drainage.  Avoid squeezing or picking at the area to prevent spread or worsening of infection.  If the symptoms worsen or fail to improve encourage patient to follow-up immediately.      Relevant Medications   sulfamethoxazole-trimethoprim (BACTRIM DS) 800-160 MG tablet   Other Visit Diagnoses     Cystitis       Relevant Medications   sulfamethoxazole-trimethoprim (BACTRIM DS) 800-160 MG tablet   Other Relevant Orders   Urine Culture        [name], DNP, AGNP-c 06/22/2022  8:05 PM

## 2022-06-22 NOTE — Assessment & Plan Note (Signed)
Reported recent abscess to the labia minora with drainage at home.  Patient reports that the area is no longer tender however it is still slightly raised.  Given the location and the presence of symptoms we will begin treatment with antibiotic therapy to aid in complete resolution.  Recommend warm compresses to the area to help facilitate natural drainage.  Avoid squeezing or picking at the area to prevent spread or worsening of infection.  If the symptoms worsen or fail to improve encourage patient to follow-up immediately.

## 2022-06-25 LAB — URINE CULTURE

## 2022-08-02 ENCOUNTER — Encounter: Payer: Self-pay | Admitting: Hematology & Oncology

## 2022-08-02 ENCOUNTER — Other Ambulatory Visit: Payer: Self-pay | Admitting: *Deleted

## 2022-08-02 ENCOUNTER — Inpatient Hospital Stay: Payer: Commercial Managed Care - HMO | Attending: Hematology & Oncology

## 2022-08-02 ENCOUNTER — Inpatient Hospital Stay (HOSPITAL_BASED_OUTPATIENT_CLINIC_OR_DEPARTMENT_OTHER): Payer: Commercial Managed Care - HMO | Admitting: Hematology & Oncology

## 2022-08-02 ENCOUNTER — Other Ambulatory Visit: Payer: Self-pay

## 2022-08-02 VITALS — BP 128/73 | HR 71 | Temp 97.9°F | Resp 18 | Ht 62.0 in | Wt 189.0 lb

## 2022-08-02 DIAGNOSIS — C9110 Chronic lymphocytic leukemia of B-cell type not having achieved remission: Secondary | ICD-10-CM | POA: Insufficient documentation

## 2022-08-02 LAB — CBC WITH DIFFERENTIAL (CANCER CENTER ONLY)
Abs Immature Granulocytes: 0.02 10*3/uL (ref 0.00–0.07)
Basophils Absolute: 0.1 10*3/uL (ref 0.0–0.1)
Basophils Relative: 1 %
Eosinophils Absolute: 0.2 10*3/uL (ref 0.0–0.5)
Eosinophils Relative: 2 %
HCT: 41.1 % (ref 36.0–46.0)
Hemoglobin: 13.3 g/dL (ref 12.0–15.0)
Immature Granulocytes: 0 %
Lymphocytes Relative: 64 %
Lymphs Abs: 8.4 10*3/uL — ABNORMAL HIGH (ref 0.7–4.0)
MCH: 29.7 pg (ref 26.0–34.0)
MCHC: 32.4 g/dL (ref 30.0–36.0)
MCV: 91.7 fL (ref 80.0–100.0)
Monocytes Absolute: 0.5 10*3/uL (ref 0.1–1.0)
Monocytes Relative: 3 %
Neutro Abs: 4 10*3/uL (ref 1.7–7.7)
Neutrophils Relative %: 30 %
Platelet Count: 461 10*3/uL — ABNORMAL HIGH (ref 150–400)
RBC: 4.48 MIL/uL (ref 3.87–5.11)
RDW: 13.3 % (ref 11.5–15.5)
Smear Review: NORMAL
WBC Count: 13.2 10*3/uL — ABNORMAL HIGH (ref 4.0–10.5)
nRBC: 0 % (ref 0.0–0.2)

## 2022-08-02 LAB — COMPREHENSIVE METABOLIC PANEL
ALT: 8 U/L (ref 0–44)
AST: 13 U/L — ABNORMAL LOW (ref 15–41)
Albumin: 4.6 g/dL (ref 3.5–5.0)
Alkaline Phosphatase: 55 U/L (ref 38–126)
Anion gap: 6 (ref 5–15)
BUN: 16 mg/dL (ref 6–20)
CO2: 29 mmol/L (ref 22–32)
Calcium: 10.2 mg/dL (ref 8.9–10.3)
Chloride: 103 mmol/L (ref 98–111)
Creatinine, Ser: 0.94 mg/dL (ref 0.44–1.00)
GFR, Estimated: >60 mL/min (ref >60)
Glucose, Bld: 93 mg/dL (ref 70–99)
Potassium: 5 mmol/L (ref 3.5–5.1)
Sodium: 138 mmol/L (ref 135–145)
Total Bilirubin: 0.3 mg/dL (ref 0.3–1.2)
Total Protein: 7.3 g/dL (ref 6.5–8.1)

## 2022-08-02 LAB — LACTATE DEHYDROGENASE: LDH: 173 U/L (ref 98–192)

## 2022-08-02 LAB — SAVE SMEAR(SSMR), FOR PROVIDER SLIDE REVIEW

## 2022-08-02 NOTE — Progress Notes (Signed)
Hematology and Oncology Follow Up Visit  Melissa Harding [id number] 06-09-1961 61 y.o. 08/02/2022   Principle Diagnosis:  CLL - Stage A  Current Therapy:   Observation   Interim History:  Melissa Harding is here today for follow-up.  She is doing okay.  We saw her a year ago.  She has had no problems since we last saw her.  Her right knee seems to doing quite well.  She did have arthroscopic knee surgery for her knee on the back in 2022.  She had a mammogram about a month ago.  This all look fine.  She has had no problem with infections.  She has not noted any swollen lymph nodes.  She has had no nausea or vomiting.  There is been no change in bowel or bladder habits.  There has been no rashes.  She has had no bleeding.  Overall, I would say performance status is ECOG 0.   Medications:  Allergies as of 08/02/2022       Reactions   Ampicillin Other (See Comments)   REACTION: seizures Did it involve swelling of the face/tongue/throat, SOB, or low BP? No Did it involve sudden or severe rash/hives, skin peeling, or any reaction on the inside of your mouth or nose? No Did you need to seek medical attention at a hospital or doctor's office? Yes When did it last happen?      36 yrs ago If all above answers are "NO", may proceed with cephalosporin use.   Percocet [oxycodone-acetaminophen] Nausea Only        Medication List        Accurate as of August 02, 2022  1:52 PM. If you have any questions, ask your nurse or doctor.          STOP taking these medications    sulfamethoxazole-trimethoprim 800-160 MG tablet Commonly known as: Bactrim DS Stopped by: Volanda Napoleon, MD       TAKE these medications    phentermine 37.5 MG capsule One capsule by mouth qAM        Allergies:  Allergies  Allergen Reactions   Ampicillin Other (See Comments)    REACTION: seizures Did it involve swelling of the face/tongue/throat, SOB, or low BP? No Did it involve sudden or severe  rash/hives, skin peeling, or any reaction on the inside of your mouth or nose? No Did you need to seek medical attention at a hospital or doctor's office? Yes When did it last happen?      36 yrs ago If all above answers are "NO", may proceed with cephalosporin use.   Percocet [Oxycodone-Acetaminophen] Nausea Only    Past Medical History, Surgical history, Social history, and Family History were reviewed and updated.  Review of Systems: Review of Systems  Constitutional: Negative.   HENT: Negative.    Eyes: Negative.   Respiratory: Negative.    Cardiovascular: Negative.   Gastrointestinal: Negative.   Genitourinary: Negative.   Musculoskeletal: Negative.   Skin: Negative.   Neurological: Negative.   Endo/Heme/Allergies: Negative.   Psychiatric/Behavioral: Negative.       Physical Exam:  height is '5\' 2"'$  (1.575 m) and weight is 189 lb (85.7 kg). Her oral temperature is 97.9 F (36.6 C). Her blood pressure is 128/73 and her pulse is 71. Her respiration is 18 and oxygen saturation is 100%.   Wt Readings from Last 3 Encounters:  08/02/22 189 lb (85.7 kg)  06/22/22 190 lb (86.2 kg)  05/10/22 189 lb 6.4 oz (  85.9 kg)   Physical Exam Vitals reviewed.  HENT:     Head: Normocephalic and atraumatic.  Eyes:     Pupils: Pupils are equal, round, and reactive to light.  Cardiovascular:     Rate and Rhythm: Normal rate and regular rhythm.     Heart sounds: Normal heart sounds.  Pulmonary:     Effort: Pulmonary effort is normal.     Breath sounds: Normal breath sounds.  Abdominal:     General: Bowel sounds are normal.     Palpations: Abdomen is soft.  Musculoskeletal:        General: No tenderness or deformity. Normal range of motion.     Cervical back: Normal range of motion.  Lymphadenopathy:     Cervical: No cervical adenopathy.  Skin:    General: Skin is warm and dry.     Findings: No erythema or rash.  Neurological:     Mental Status: She is alert and oriented to person,  place, and time.  Psychiatric:        Behavior: Behavior normal.        Thought Content: Thought content normal.        Judgment: Judgment normal.      Lab Results  Component Value Date   WBC 13.2 (H) 08/02/2022   HGB 13.3 08/02/2022   HCT 41.1 08/02/2022   MCV 91.7 08/02/2022   PLT 461 (H) 08/02/2022   No results found for: "FERRITIN", "IRON", "TIBC", "UIBC", "IRONPCTSAT" Lab Results  Component Value Date   RBC 4.48 08/02/2022   Lab Results  Component Value Date   KPAFRELGTCHN 18.3 08/07/2020   LAMBDASER 8.7 08/07/2020   KAPLAMBRATIO 2.10 (H) 08/07/2020   Lab Results  Component Value Date   IGGSERUM 868 07/31/2021   IGA 47 (L) 07/31/2021   IGMSERUM 26 07/31/2021   Lab Results  Component Value Date   TOTALPROTELP 6.8 08/07/2020   ALBUMINELP 4.3 08/07/2020   A1GS 0.1 08/07/2020   A2GS 0.6 08/07/2020   BETS 0.9 08/07/2020   GAMS 0.9 08/07/2020   MSPIKE Not Observed 08/07/2020     Chemistry      Component Value Date/Time   NA 138 12/23/2021 0926   K 4.8 12/23/2021 0926   CL 103 12/23/2021 0926   CO2 27 12/23/2021 0926   BUN 18 12/23/2021 0926   CREATININE 0.78 12/23/2021 0926   CREATININE 0.79 07/31/2021 1321      Component Value Date/Time   CALCIUM 9.9 12/23/2021 0926   ALKPHOS 84 12/23/2021 0926   AST 18 12/23/2021 0926   AST 14 (L) 07/31/2021 1321   ALT 13 12/23/2021 0926   ALT 10 07/31/2021 1321   BILITOT 0.2 12/23/2021 0926   BILITOT 0.3 07/31/2021 1321       Impression and Plan: Melissa Harding is a very pleasant 61 yo African American female with stage A CLL.  Everything is really holding steady.  Her white cell count really has not changed in about 3 years.  We will still follow her yearly.  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  I am very impressed by this.  It is a calling and they really do well.     Volanda Napoleon, MD 9/18/20231:52 PM

## 2022-10-25 ENCOUNTER — Ambulatory Visit (INDEPENDENT_AMBULATORY_CARE_PROVIDER_SITE_OTHER): Payer: Commercial Managed Care - HMO | Admitting: Physician Assistant

## 2022-10-25 ENCOUNTER — Ambulatory Visit (INDEPENDENT_AMBULATORY_CARE_PROVIDER_SITE_OTHER): Payer: Commercial Managed Care - HMO

## 2022-10-25 ENCOUNTER — Encounter: Payer: Self-pay | Admitting: Physician Assistant

## 2022-10-25 DIAGNOSIS — M1711 Unilateral primary osteoarthritis, right knee: Secondary | ICD-10-CM

## 2022-10-25 NOTE — Progress Notes (Signed)
Office Visit Note   Patient: Melissa Harding           Date of Birth: 1960-12-17           MRN: [id number] Visit Date: 10/25/2022              Requested by: No referring provider defined for this encounter. PCP: No primary care provider on file.   Assessment & Plan: Visit Diagnoses:  1. Primary osteoarthritis of right knee     Plan:  She will work on Forensic scientist.  Like to see her back in 4 weeks see how her knee is doing also at that time we will discuss her left shoulder which she has still decreased range of motion of.  Questions were encouraged and answered with her about NSAIDs and natural anti-inflammatories.  Follow-Up Instructions: Return in about 4 weeks (around 11/22/2022).   Orders:  Orders Placed This Encounter  Procedures   XR KNEE 3 VIEW RIGHT   No orders of the defined types were placed in this encounter.     Procedures: No procedures performed   Clinical Data: No additional findings.   Subjective: Chief Complaint  Patient presents with   Right Knee - Pain    HPI Melissa Harding returns today for right knee pain.  We last saw her on 07/21/2021 for follow-up after cortisone injection was performed in July 2022.  He also had undergone a right knee arthroscopy for a complex medial meniscal tear and was found to have grade 3 changes in her medial compartment.  She states she has had no injury to the knee.  The knee just started bothering her over a month ago.  She has pain when ambulating.  Denies any mechanical symptoms.  She is taken nothing for the pain .  She notes some swelling.  She is nondiabetic.   Review of Systems Denies fevers or chills.  Objective: Vital Signs: There were no vitals taken for this visit.  Physical Exam Constitutional:      Appearance: She is not ill-appearing or diaphoretic.  Neurological:     Mental Status: She is alert.     Ortho Exam Bilateral knees slightly hyperextend.  There is no abnormal warmth or effusion of  either knee.  No instability valgus varus stressing of either knee.  Tenderness medial joint line of the right knee only.  Good range of motion of both knees. Specialty Comments:  No specialty comments available.  Imaging: XR KNEE 3 VIEW RIGHT  Result Date: 10/25/2022 Right knee 2 views: Shows slight increase in narrowing of the medial joint line compared to prior films done a year and 9 months ago.  No acute fractures.    PMFS History: Patient Active Problem List   Diagnosis Date Noted   Abscess 06/22/2022   Post-menopausal bleeding 02/16/2022   Acute left-sided low back pain without sciatica 02/16/2022   BMI 37.0-37.9, adult 12/25/2021   Chronic left shoulder pain 10/22/2021   Primary osteoarthritis of right knee 07/21/2021   CLL (chronic lymphocytic leukemia) ([place]) 08/07/2020   Encounter for annual physical exam 08/07/2020   Personal history of CLL (chronic lymphocytic leukemia) 11/01/2019   Lymphocytosis 02/05/2019   MENOPAUSAL SYNDROME 03/05/2009   Past Medical History:  Diagnosis Date   Acute medial meniscus tear, right, subsequent encounter 03/11/2021   CLL (chronic lymphocytic leukemia) ([place]) 08/07/2020   Goals of care, counseling/discussion 08/07/2020   MMT (medial meniscus tear)    right    Family History  Problem Relation Age of Onset   Hypertension Mother    Hypertension Father    Cancer Paternal Grandmother    Stomach cancer Paternal Grandmother    Heart attack Paternal Grandfather    Colon cancer Neg Hx    Colon polyps Neg Hx    Esophageal cancer Neg Hx    Rectal cancer Neg Hx     Past Surgical History:  Procedure Laterality Date   APPENDECTOMY     CESAREAN SECTION     2 previous   ECTOPIC PREGNANCY SURGERY     x2   KNEE ARTHROSCOPY Right 03/12/2021   Procedure: RIGHT KNEE ARTHROSCOPY WITH PARTIAL MEDIAL MENISCECTOMY;  Surgeon: Mcarthur Rossetti, MD;  Location: [facility];  Service: Orthopedics;  Laterality: Right;   LAPAROSCOPIC  TUBAL LIGATION     TUBAL LIGATION     Social History   Occupational History   Occupation: part time    Comment: self employed  Tobacco Use   Smoking status: Former    Types: Cigarettes    Quit date: 2000    Years since quitting: 23.9   Smokeless tobacco: Never  Vaping Use   Vaping Use: Never used  Substance and Sexual Activity   Alcohol use: Yes    Alcohol/week: 1.0 standard drink of alcohol    Types: 1 Glasses of wine per week    Comment: social   Drug use: No   Sexual activity: Yes    Birth control/protection: Surgical    Comment: BTL

## 2022-11-22 ENCOUNTER — Ambulatory Visit: Payer: Commercial Managed Care - HMO | Admitting: Physician Assistant

## 2022-12-06 ENCOUNTER — Ambulatory Visit (INDEPENDENT_AMBULATORY_CARE_PROVIDER_SITE_OTHER): Payer: Commercial Managed Care - HMO | Admitting: Physician Assistant

## 2022-12-06 ENCOUNTER — Encounter: Payer: Self-pay | Admitting: Physician Assistant

## 2022-12-06 DIAGNOSIS — M1711 Unilateral primary osteoarthritis, right knee: Secondary | ICD-10-CM | POA: Diagnosis not present

## 2022-12-06 DIAGNOSIS — M25512 Pain in left shoulder: Secondary | ICD-10-CM

## 2022-12-06 NOTE — Addendum Note (Signed)
Addended by: Robyne Peers on: 12/06/2022 03:06 PM   Modules accepted: Orders

## 2022-12-06 NOTE — Progress Notes (Signed)
HPI: Melissa Harding returns today for follow-up of her right knee.  4 weeks ago she was given a cortisone injection in her knee which she states helped.  States knee overall is doing well.  Main complaint continues to be her left shoulder pain pain.  She has tried therapy and cortisone injection in the shoulder.  Last injection was 11-23.  States that shoulder no longer awakens her but she still has pain with certain motions and she states her pain to be 10 out of 10.  She has just trouble when reaching overhead for things.  She is asking about possible injection in the right knee before she goes on the trip on 2/17.   Physical exam: General well-developed well-nourished female who ambulates without any assistive device. Bilateral shoulders: 5 out of 5 strength with external and internal rotation against resistance.  Left shoulder overhead activity active is 170 degrees passively she is able to to come to 180 degrees.  Positive impingement on the left negative on the right.  Empty can test is negative bilaterally.  Positive liftoff test on the left negative on the right.  Impression: Left shoulder impingement Right knee arthritis  Plan: In regards to her right knee recommend she continue to work on quad strengthening which is reviewed with her.  She can apply Voltaren gel 4 g 4 times daily to the right knee.  Left shoulder pain has been ongoing since before November of last year and she has tried physical therapy continue to home exercise program and try cortisone injection continues to have pain in the shoulder and has weakness on exam today.  Therefore recommend MRI to rule out rotator cuff tear.  Will have her undergo this MRI and follow-up with [place] once results are available.  Encouraged and answered

## 2022-12-15 ENCOUNTER — Other Ambulatory Visit (HOSPITAL_BASED_OUTPATIENT_CLINIC_OR_DEPARTMENT_OTHER): Payer: Managed Care, Other (non HMO)

## 2022-12-23 ENCOUNTER — Encounter (HOSPITAL_BASED_OUTPATIENT_CLINIC_OR_DEPARTMENT_OTHER): Payer: Managed Care, Other (non HMO) | Admitting: Nurse Practitioner

## 2022-12-24 ENCOUNTER — Encounter (HOSPITAL_BASED_OUTPATIENT_CLINIC_OR_DEPARTMENT_OTHER): Payer: Managed Care, Other (non HMO) | Admitting: Nurse Practitioner

## 2023-01-15 ENCOUNTER — Inpatient Hospital Stay: Admission: RE | Admit: 2023-01-15 | Payer: Commercial Managed Care - HMO | Source: Ambulatory Visit

## 2023-01-20 ENCOUNTER — Encounter: Payer: Self-pay | Admitting: Radiology

## 2023-02-07 IMAGING — US US PELVIS COMPLETE WITH TRANSVAGINAL
1 series · 13 of 25 positions shown · non-contrast
Comparison: CT abdomen pelvis dated August 15, 2018.

CLINICAL DATA: Acute right-sided low back pain. History of ectopic
pregnancy.



[Series 1: us pelvis complete with transvaginal · 0.23mm/px · 13 of 82 slices shown]
[im 1/82]
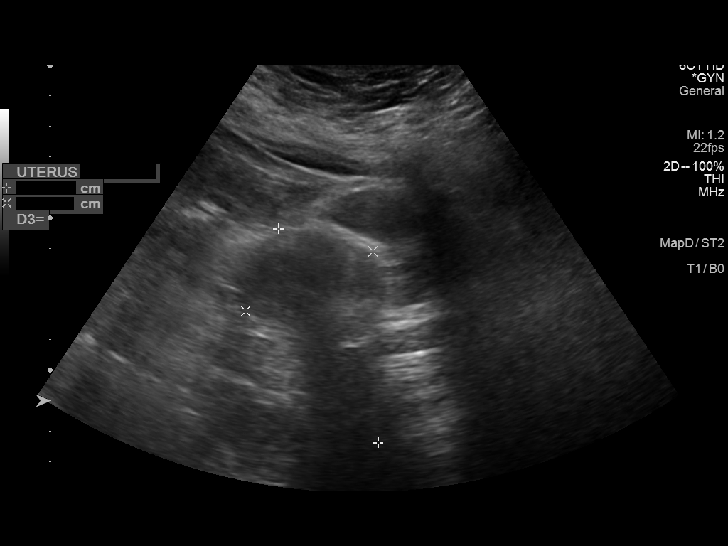
[im 7/82]
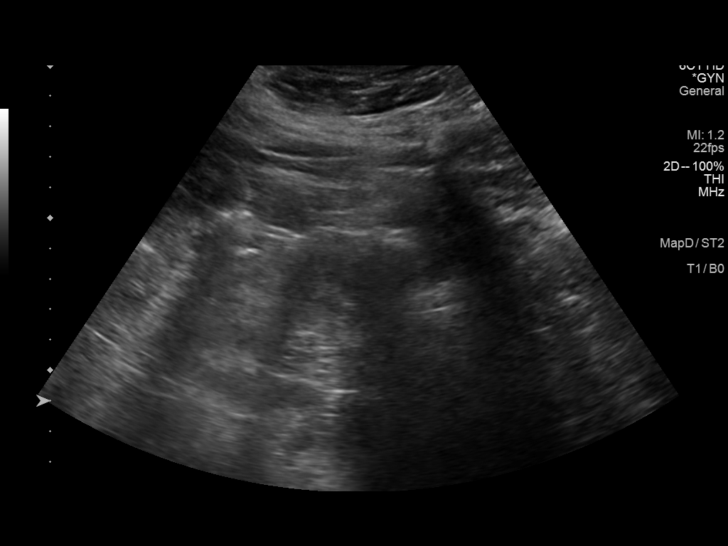
[im 14/82]
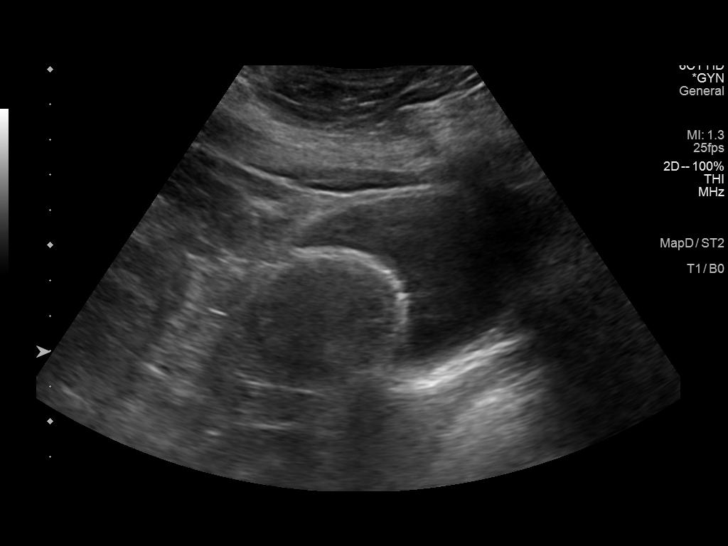
[im 21/82]
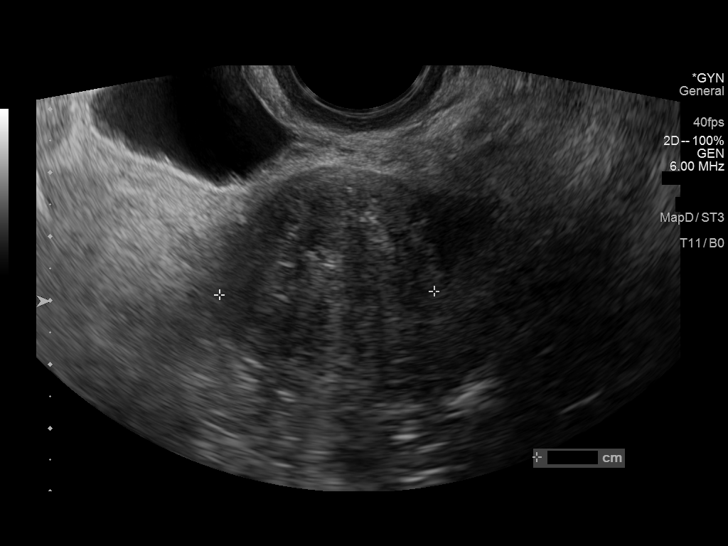
[im 28/82]
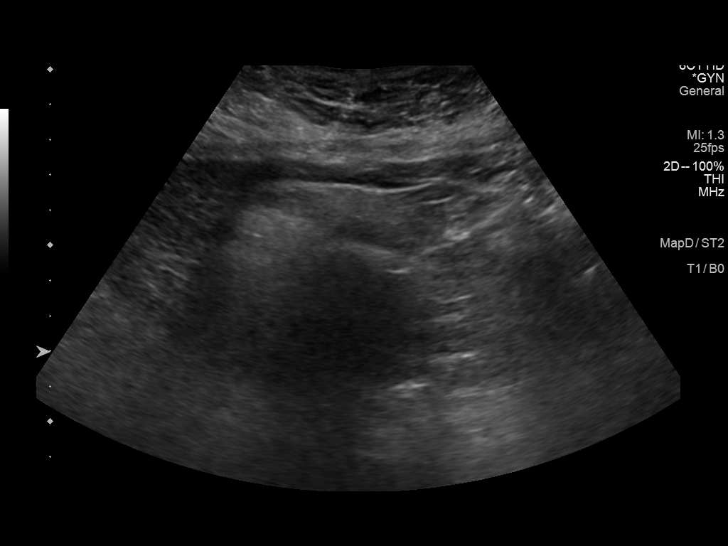
[im 34/82]
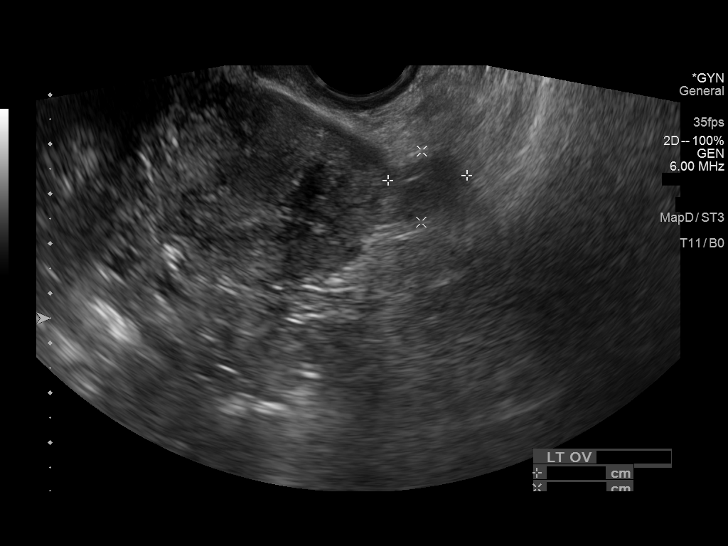
[im 41/82]
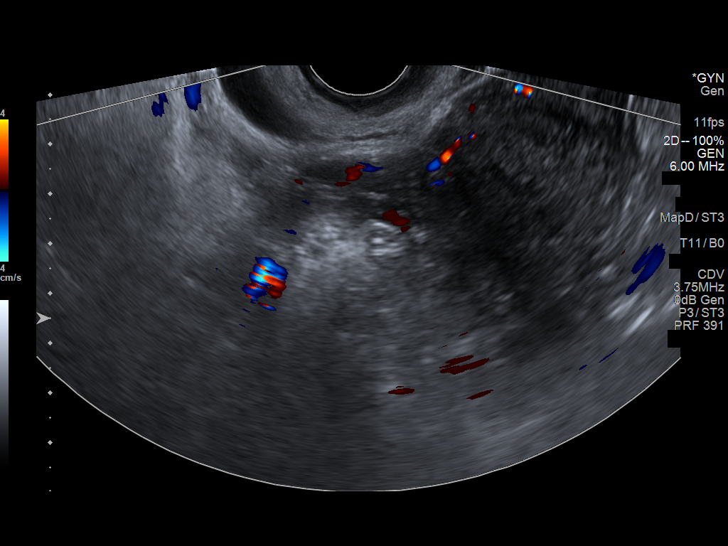
[im 48/82]
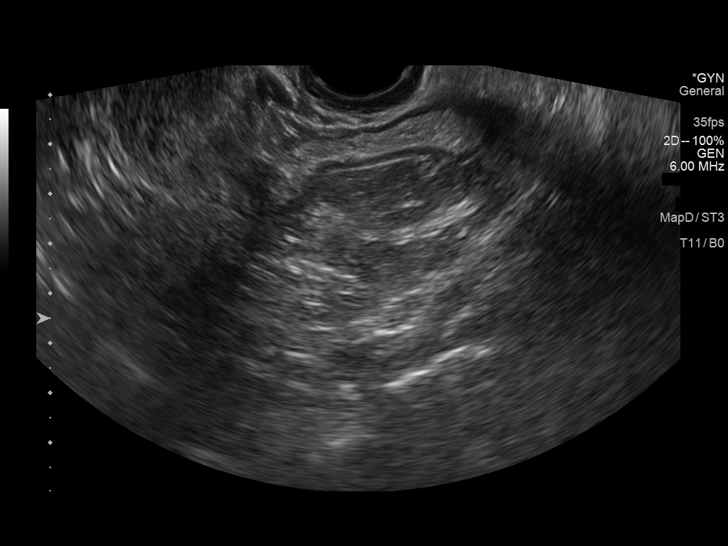
[im 55/82]
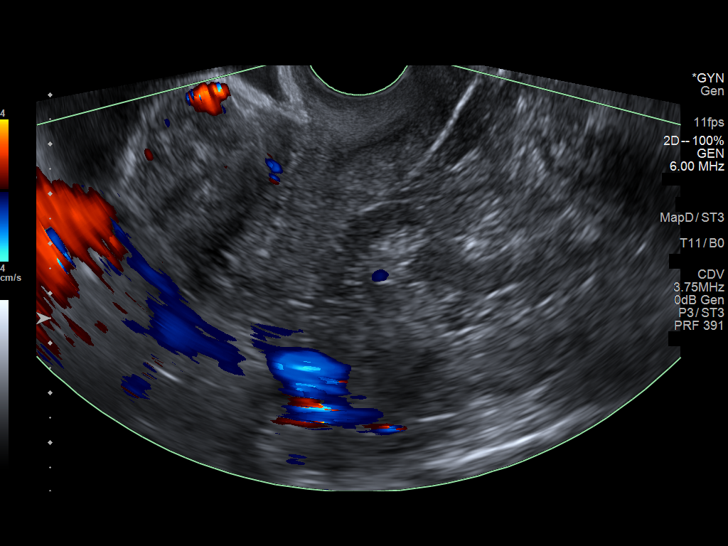
[im 61/82]
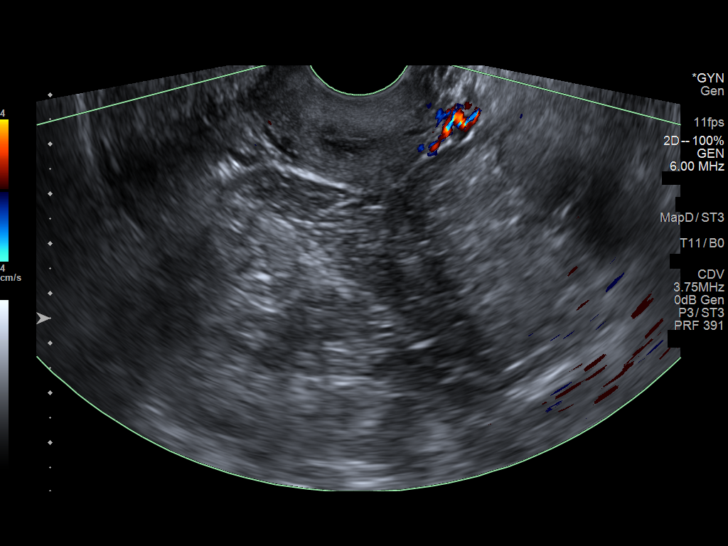
[im 68/82]
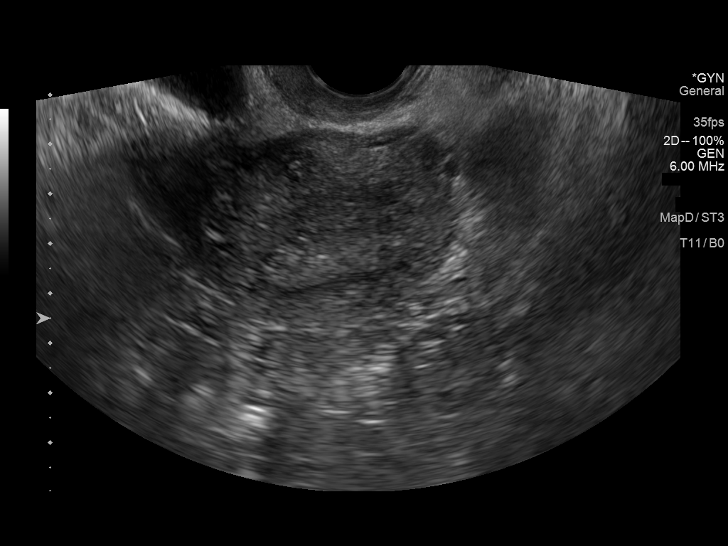
[im 75/82]
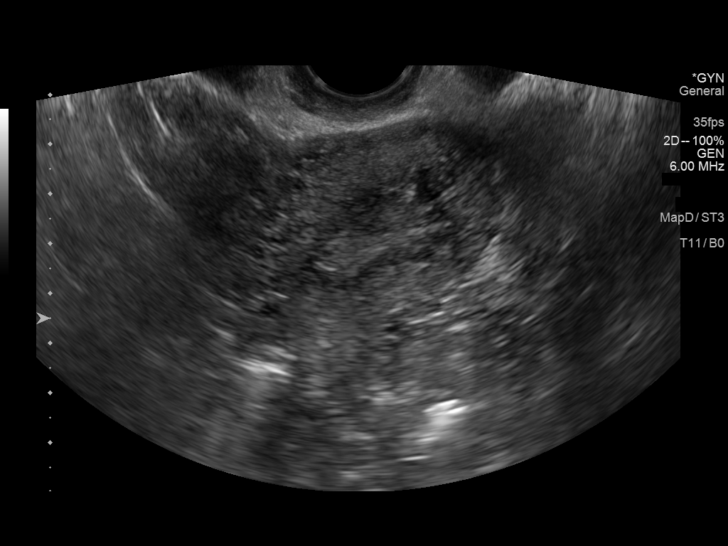
[im 82/82]
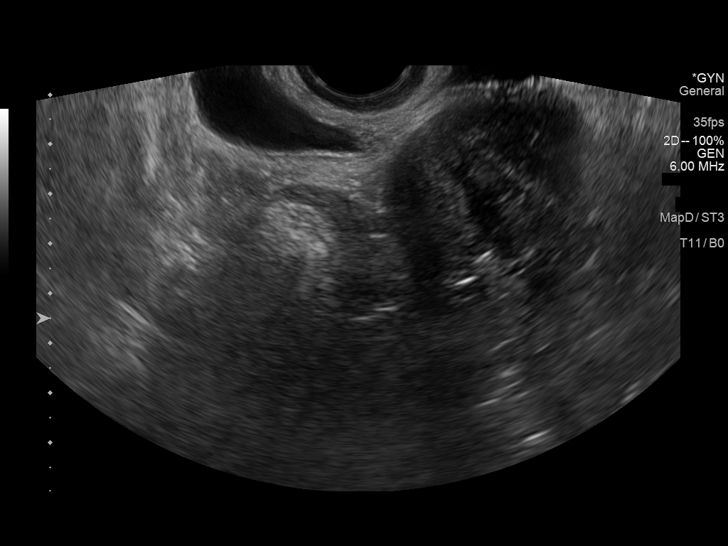

[13 of 25 positions shown; findings below may reference images not displayed]

FINDINGS: Uterus

Measurements: 7.8 x 4.6 x 5.7 cm = volume: 107 mL. 3.5 x 2.4 x
cm predominantly intramural fibroid in the left anterior fundus,
with suspected small submucosal component.

Endometrium

Thickness: 7 mm, at the upper limits of normal in the absence of
abnormal bleeding. No focal abnormality visualized.

Right ovary

Not visualized.

Left ovary

Measurements: 1.9 x 1.4 x 1.3 cm = volume: 1.8 mL. Normal
appearance/no adnexal mass.

Other findings

No abnormal free fluid.
IMPRESSION: 1. No acute abnormality.  Nonvisualization of the right ovary.
2. 3.5 cm intramural/submucosal fibroid in the left anterior fundus.

## 2023-03-25 ENCOUNTER — Encounter (HOSPITAL_COMMUNITY): Payer: Self-pay

## 2023-03-25 ENCOUNTER — Ambulatory Visit (HOSPITAL_COMMUNITY)
Admission: EM | Admit: 2023-03-25 | Discharge: 2023-03-25 | Disposition: A | Discharge status: Home / Self Care | Payer: Commercial Managed Care - HMO | Attending: Family Medicine | Admitting: Family Medicine

## 2023-03-25 DIAGNOSIS — R058 Other specified cough: Secondary | ICD-10-CM | POA: Insufficient documentation

## 2023-03-25 DIAGNOSIS — J22 Unspecified acute lower respiratory infection: Secondary | ICD-10-CM | POA: Diagnosis present

## 2023-03-25 DIAGNOSIS — Z1152 Encounter for screening for COVID-19: Secondary | ICD-10-CM | POA: Insufficient documentation

## 2023-03-25 DIAGNOSIS — B9689 Other specified bacterial agents as the cause of diseases classified elsewhere: Secondary | ICD-10-CM | POA: Diagnosis not present

## 2023-03-25 LAB — POCT INFLUENZA A/B
Influenza A, POC: NEGATIVE
Influenza B, POC: NEGATIVE

## 2023-03-25 MED ORDER — GUAIFENESIN-CODEINE 100-10 MG/5ML PO SOLN: 5.0000 mL | Freq: Three times a day (TID) | ORAL | 0 refills | Status: DC | PRN

## 2023-03-25 MED ORDER — DOXYCYCLINE HYCLATE 100 MG PO CAPS: 100.0000 mg | ORAL_CAPSULE | Freq: Two times a day (BID) | ORAL | 0 refills | Status: AC

## 2023-03-25 MED ORDER — PREDNISONE 10 MG PO TABS: 10.0000 mg | ORAL_TABLET | Freq: Every day | ORAL | 0 refills | Status: AC

## 2023-03-25 NOTE — ED Triage Notes (Signed)
Pt reports nasal congestion.  Pt reports coughing up phlegm. Pt reports headache and fever x 3 days.

## 2023-03-25 NOTE — ED Provider Notes (Signed)
[facility]    CSN: [id number] Arrival date & time: 03/25/23  0920      History   Chief Complaint Chief Complaint  Patient presents with   Cough   Nasal Congestion   Fever    HPI Melissa Harding is a 62 y.o. female.   HPI Patient presents to for evaluation of congestion, productive cough, headache and subjective fever x 3 days.  Patient concerned that she may have flu.  She denies any known sick contacts.  Patient is immunocompromised with a history of chronic lymphocytic leukemia.Patient is currently afebrile.  Patient has taken over the counter medications without relief of symptoms.Cough is productive of yellow sputum today and initially was clear. Patient is unable to complete a sentence without coughing during evaluation. Patient has no known history of asthma or recurrent bronchitis. Endorses generalized fatigue and nasal congestion. Past Medical History:  Diagnosis Date   Acute medial meniscus tear, right, subsequent encounter 03/11/2021   CLL (chronic lymphocytic leukemia) ([facility]) 08/07/2020   Goals of care, counseling/discussion 08/07/2020   MMT (medial meniscus tear)    right    Patient Active Problem List   Diagnosis Date Noted   Abscess 06/22/2022   Post-menopausal bleeding 02/16/2022   Acute left-sided low back pain without sciatica 02/16/2022   BMI 37.0-37.9, adult 12/25/2021   Chronic left shoulder pain 10/22/2021   Primary osteoarthritis of right knee 07/21/2021   CLL (chronic lymphocytic leukemia) ([facility]) 08/07/2020   Encounter for annual physical exam 08/07/2020   Personal history of CLL (chronic lymphocytic leukemia) 11/01/2019   Lymphocytosis 02/05/2019   MENOPAUSAL SYNDROME 03/05/2009    Past Surgical History:  Procedure Laterality Date   APPENDECTOMY     CESAREAN SECTION     2 previous   ECTOPIC PREGNANCY SURGERY     x2   KNEE ARTHROSCOPY Right 03/12/2021   Procedure: RIGHT KNEE ARTHROSCOPY WITH PARTIAL MEDIAL MENISCECTOMY;  Surgeon:  Kathryne Hitch, MD;  Location: [facility];  Service: Orthopedics;  Laterality: Right;   LAPAROSCOPIC TUBAL LIGATION     TUBAL LIGATION      OB History     Gravida  6   Para      Term      Preterm      AB  4   Living  2      SAB      IAB  2   Ectopic  2   Multiple      Live Births  2            Home Medications    Prior to Admission medications   Medication Sig Start Date End Date Taking? Authorizing Provider  doxycycline (VIBRAMYCIN) 100 MG capsule Take 1 capsule (100 mg total) by mouth 2 (two) times daily for 7 days. 03/25/23 04/01/23 Yes Bing Neighbors, NP  guaiFENesin-codeine 100-10 MG/5ML syrup Take 5 mLs by mouth 3 (three) times daily as needed for cough. 03/25/23  Yes Bing Neighbors, NP  predniSONE (DELTASONE) 10 MG tablet Take 1 tablet (10 mg total) by mouth daily with breakfast for 5 days. 03/25/23 03/30/23 Yes Bing Neighbors, NP  phentermine 37.5 MG capsule One capsule by mouth qAM 06/22/22   Early, Sung Amabile, NP    Family History Family History  Problem Relation Age of Onset   Hypertension Mother    Hypertension Father    Cancer Paternal Grandmother    Stomach cancer Paternal Grandmother    Heart attack  Paternal Grandfather    Colon cancer Neg Hx    Colon polyps Neg Hx    Esophageal cancer Neg Hx    Rectal cancer Neg Hx     Social History Social History   Tobacco Use   Smoking status: Former    Types: Cigarettes    Quit date: 2000    Years since quitting: 24.3   Smokeless tobacco: Never  Vaping Use   Vaping Use: Never used  Substance Use Topics   Alcohol use: Yes    Alcohol/week: 1.0 standard drink of alcohol    Types: 1 Glasses of wine per week    Comment: social   Drug use: No     Allergies   Ampicillin and Percocet [oxycodone-acetaminophen]   Review of Systems Review of Systems Pertinent negatives listed in HPI   Physical Exam Triage Vital Signs ED Triage Vitals [03/25/23 1015]  Enc  Vitals Group     BP 112/76     Pulse Rate 78     Resp 18     Temp 98.6 F (37 C)     Temp Source Oral     SpO2 98 %     Weight      Height      Head Circumference      Peak Flow      Pain Score      Pain Loc      Pain Edu?      Excl. in GC?    No data found.  Updated Vital Signs BP 112/76 (BP Location: Left Arm)   Pulse 78   Temp 98.6 F (37 C) (Oral)   Resp 18   SpO2 98%   Visual Acuity Right Eye Distance:   Left Eye Distance:   Bilateral Distance:    Right Eye Near:   Left Eye Near:    Bilateral Near:     Physical Exam Constitutional:      Appearance: She is well-developed.  HENT:     Head: Normocephalic and atraumatic.     Right Ear: Tympanic membrane normal.     Left Ear: Tympanic membrane normal.     Nose: Congestion and rhinorrhea present.     Mouth/Throat:     Mouth: Mucous membranes are moist.  Eyes:     Conjunctiva/sclera: Conjunctivae normal.     Pupils: Pupils are equal, round, and reactive to light.  Neck:     Thyroid: No thyromegaly.     Trachea: No tracheal deviation.  Cardiovascular:     Rate and Rhythm: Normal rate and regular rhythm.     Heart sounds: Normal heart sounds.  Pulmonary:     Breath sounds: Rhonchi present.     Comments: Productive cough present on exam  Musculoskeletal:     Cervical back: Normal range of motion and neck supple.  Lymphadenopathy:     Cervical: No cervical adenopathy.  Skin:    General: Skin is warm and dry.     Capillary Refill: Capillary refill takes less than 2 seconds.  Neurological:     Mental Status: She is alert and oriented to person, place, and time.  Psychiatric:        Behavior: Behavior normal.        Thought Content: Thought content normal.        Judgment: Judgment normal.      UC Treatments / Results  Labs (all labs ordered are listed, but only abnormal results are displayed) Labs Reviewed  SARS CORONAVIRUS 2 (  TAT 6-24 HRS)  POCT INFLUENZA A/B    EKG   Radiology No  results found.  Procedures Procedures (including critical care time)  Medications Ordered in UC Medications - No data to display  Initial Impression / Assessment and Plan / UC Course  I have reviewed the triage vital signs and the nursing notes.  Pertinent labs & imaging results that were available during my care of the patient were reviewed by me and considered in my medical decision making (see chart for details).   Patient is ill-appearing, immunocompromised, and based on exam concern for an evolving pneumonia. Treating today for a bacterial lower respiratory infection. Out of an abundance of caution obtained a Rapid Flu test-negative, and COVID test is pending.Treatment per discharge medication orders and patient advised to return here if symptoms do not improve or go to the nearest ED if symptoms become severe. Patient verbalized understanding and agreement with plan. Final Clinical Impressions(s) / UC Diagnoses   Final diagnoses:  Bacterial lower respiratory infection  Productive cough     Discharge Instructions      If you develop worsening shortness of breath, fever , or if your symptoms do not readily improve within the next few days return for evaluation. Your COVID test will result within 24 hours and our office will contact you if your results are abnormal.  If your results are negative you will not receive a follow-up call from our office however you can view results on MyChart.     ED Prescriptions     Medication Sig Dispense Auth. Provider   doxycycline (VIBRAMYCIN) 100 MG capsule Take 1 capsule (100 mg total) by mouth 2 (two) times daily for 7 days. 14 capsule Bing Neighbors, NP   guaiFENesin-codeine 100-10 MG/5ML syrup Take 5 mLs by mouth 3 (three) times daily as needed for cough. 140 mL Bing Neighbors, NP   predniSONE (DELTASONE) 10 MG tablet Take 1 tablet (10 mg total) by mouth daily with breakfast for 5 days. 5 tablet Bing Neighbors, NP      I  have reviewed the PDMP during this encounter.   Bing Neighbors, NP 03/27/23 1528

## 2023-03-25 NOTE — Discharge Instructions (Addendum)
If you develop worsening shortness of breath, fever , or if your symptoms do not readily improve within the next few days return for evaluation. Your COVID test will result within 24 hours and our office will contact you if your results are abnormal.  If your results are negative you will not receive a follow-up call from our office however you can view results on MyChart.

## 2023-03-26 LAB — SARS CORONAVIRUS 2 (TAT 6-24 HRS): SARS Coronavirus 2: NEGATIVE

## 2023-03-31 ENCOUNTER — Encounter (HOSPITAL_BASED_OUTPATIENT_CLINIC_OR_DEPARTMENT_OTHER): Payer: Self-pay | Admitting: Family Medicine

## 2023-03-31 ENCOUNTER — Ambulatory Visit (INDEPENDENT_AMBULATORY_CARE_PROVIDER_SITE_OTHER): Payer: Commercial Managed Care - HMO | Admitting: Family Medicine

## 2023-03-31 ENCOUNTER — Ambulatory Visit (INDEPENDENT_AMBULATORY_CARE_PROVIDER_SITE_OTHER): Payer: Commercial Managed Care - HMO

## 2023-03-31 VITALS — BP 123/71 | HR 67 | Wt 191.2 lb

## 2023-03-31 DIAGNOSIS — Z6837 Body mass index (BMI) 37.0-37.9, adult: Secondary | ICD-10-CM | POA: Diagnosis not present

## 2023-03-31 DIAGNOSIS — R062 Wheezing: Secondary | ICD-10-CM

## 2023-03-31 DIAGNOSIS — R051 Acute cough: Secondary | ICD-10-CM | POA: Diagnosis not present

## 2023-03-31 MED ORDER — ALBUTEROL SULFATE HFA 108 (90 BASE) MCG/ACT IN AERS: 2.0000 | INHALATION_SPRAY | Freq: Four times a day (QID) | RESPIRATORY_TRACT | 0 refills | Status: DC | PRN

## 2023-03-31 MED ORDER — PROMETHAZINE-DM 6.25-15 MG/5ML PO SYRP: 5.0000 mL | ORAL_SOLUTION | Freq: Four times a day (QID) | ORAL | 0 refills | Status: DC | PRN

## 2023-03-31 MED ORDER — PHENTERMINE HCL 37.5 MG PO TABS: 37.5000 mg | ORAL_TABLET | Freq: Every day | ORAL | 2 refills | Status: DC

## 2023-03-31 NOTE — Progress Notes (Signed)
Established Patient Office Visit  Subjective   Patient ID: Melissa Harding, female    DOB: Apr 14, 1961  Age: 62 y.o. MRN: [id number]  Chief Complaint  Patient presents with   Chest congestion    Still having some rattling, no more yellow mucus but still coughing bad   Seen at [facility] 03/25/2023 Denies fever/chills, body aches, N/V, abdominal pain Reports she is still experiencing a cough. Does not feel she has nasal congestion but is frequently blowing her nose with clear discharge. Sounds congested when speaking.  She reports her wheezing stopped but she still hears a "rattling" sound.  She is up all night due to the prednisone she was taking.    Review of Systems  Constitutional:  Negative for chills, fever and malaise/fatigue.  HENT:  Positive for congestion. Negative for sinus pain.   Respiratory:  Positive for cough, sputum production and wheezing. Negative for shortness of breath.   Cardiovascular:  Negative for chest pain.  Gastrointestinal:  Negative for abdominal pain, nausea and vomiting.  Musculoskeletal:  Negative for myalgias.  Neurological:  Negative for dizziness and headaches.    Objective:   BP 123/71   Pulse 67   Wt 191 lb 3.2 oz (86.7 kg)   SpO2 100%   BMI 34.97 kg/m  BP Readings from Last 3 Encounters:  03/31/23 123/71  03/25/23 112/76  08/02/22 128/73     Physical Exam Constitutional:      Appearance: Normal appearance.  Cardiovascular:     Rate and Rhythm: Normal rate and regular rhythm.     Pulses: Normal pulses.     Heart sounds: Normal heart sounds, S1 normal and S2 normal.  Pulmonary:     Effort: Pulmonary effort is normal. No respiratory distress.     Breath sounds: Decreased air movement present. Examination of the right-upper field reveals wheezing. Examination of the left-upper field reveals wheezing. Examination of the right-lower field reveals rhonchi. Examination of the left-lower field reveals rhonchi. Wheezing and rhonchi present.   Neurological:     Mental Status: She is alert.  Psychiatric:        Mood and Affect: Mood normal.        Behavior: Behavior normal.        Thought Content: Thought content normal.        Judgment: Judgment normal.    Assessment & Plan:  1. Wheezing Patient presents today for continued cough, occasional wheezing, and nasal congestion. Denies chest pain, palpitations, changes in vision, shortness of breath, lower extremity edema, headaches, lightheadedness, weakness, fever/chills, productive cough.  Patient in no acute distress and is well-appearing. Cardiovascular exam with heart regular rate and rhythm. Normal heart sounds, no murmurs present. Lungs present with wheezing and decreased breath sounds in bilateral upper lobes posteriorly and bilateral rhonchi posteriorly in lower lobes. Due to adventitious lung sounds and continued cough, will order chest x-ray today. Advised patient this could be acute bronchitis, allergic rhinitis, asthma, upper airway cough syndrome, viral upper respiratory tract infection, or pneumonia. Patient reports feeling better than last week. Advised patient to utilize over-the-counter nasal corticosteroid spray to help with nasal congestion. Will provide patient with another cough medication- promethazine- to take as needed. Prescribed albuterol inhaler to use as needed for wheezing. Advised her to continue to drink plenty of fluids and rest. Advised her that cough may linger and if she does not improve by next week, she should return to office.  - albuterol (VENTOLIN HFA) 108 (90 Base) MCG/ACT inhaler; Inhale  2 puffs into the lungs every 6 (six) hours as needed for wheezing or shortness of breath.  Dispense: 8 g; Refill: 0 - DG Chest 2 View; Future  2. Acute cough See #1 - promethazine-dextromethorphan (PROMETHAZINE-DM) 6.25-15 MG/5ML syrup; Take 5 mLs by mouth 4 (four) times daily as needed for cough (Maximum dose: 30mL in 24 hours).  Dispense: 118 mL; Refill: 0 - DG  Chest 2 View; Future  3. BMI 37.0-37.9, adult Patient would like a refill today of this medication. PDMP reviewed, no red flags. Follow-up in 3 months for weight loss management.  - phentermine (ADIPEX-P) 37.5 MG tablet; Take 1 tablet (37.5 mg total) by mouth daily before breakfast.  Dispense: 30 tablet; Refill: 2   Return in 3 months (on 07/01/2023), or if symptoms worsen or fail to improve, for weight loss f/u.    Alyson Reedy, FNP

## 2023-04-18 ENCOUNTER — Telehealth: Payer: Self-pay

## 2023-04-18 NOTE — Telephone Encounter (Signed)
Received phone call from patient who stated she has been experiencing rashes. Patient unable to describe them but states they are itchy. Pt unable to state how long she has had them or when they started. This RN referred patient to her PCP to have the rash evaluated and if the PCP thinks it is related to her history then to call and we can make an appointment. Pt very appreciative of the help stating "I had no idea where to go"

## 2023-04-19 ENCOUNTER — Encounter (HOSPITAL_BASED_OUTPATIENT_CLINIC_OR_DEPARTMENT_OTHER): Payer: Self-pay | Admitting: Family Medicine

## 2023-04-19 ENCOUNTER — Ambulatory Visit (INDEPENDENT_AMBULATORY_CARE_PROVIDER_SITE_OTHER): Payer: Commercial Managed Care - HMO | Admitting: Family Medicine

## 2023-04-19 ENCOUNTER — Telehealth (HOSPITAL_BASED_OUTPATIENT_CLINIC_OR_DEPARTMENT_OTHER): Payer: Self-pay | Admitting: Family Medicine

## 2023-04-19 VITALS — BP 114/84 | HR 61 | Temp 98.0°F | Ht 62.0 in | Wt 189.8 lb

## 2023-04-19 DIAGNOSIS — R21 Rash and other nonspecific skin eruption: Secondary | ICD-10-CM | POA: Diagnosis not present

## 2023-04-19 MED ORDER — BETAMETHASONE DIPROPIONATE 0.05 % EX CREA: TOPICAL_CREAM | Freq: Two times a day (BID) | CUTANEOUS | 0 refills | Status: DC

## 2023-04-19 NOTE — Progress Notes (Signed)
   Established Patient Office Visit  Subjective   Patient ID: Melissa Harding, female    DOB: Jun 18, 1961  Age: 62 y.o. MRN: [id number]  Chief Complaint  Patient presents with   Rash   Melissa Harding is a 62 year-old female patient who presents today for concerns about a rash.  She reports that it started a few days ago on her arms, on her stomach, and on her sternum. She reports experiencing a rash on her arms bilaterally that she would notice in the summer for the past few years but this seems different. She reports using OTC antifungal cream and a steroid cream on this rash with slight improvement. There are two small spots on her left and right forearms and an eczematous spot on her stomach and between her breasts. She reports that it is excessively itchy. She reports not changing her body wash, body lotion, or laundry detergent.   Review of Systems  Constitutional:  Negative for chills, fever and malaise/fatigue.  Respiratory:  Negative for cough.   Cardiovascular:  Negative for chest pain.  Gastrointestinal:  Negative for abdominal pain, nausea and vomiting.  Skin:  Positive for itching and rash.  Neurological:  Negative for weakness and headaches.  Psychiatric/Behavioral:  Negative for depression and suicidal ideas. The patient is not nervous/anxious.    Objective:    BP 114/84   Pulse 61   Temp 98 F (36.7 C) (Oral)   Ht 5\' 2"  (1.575 m)   Wt 189 lb 12.8 oz (86.1 kg)   SpO2 100%   BMI 34.71 kg/m  BP Readings from Last 3 Encounters:  04/19/23 114/84  03/31/23 123/71  03/25/23 112/76    Physical Exam Constitutional:      Appearance: Normal appearance.  Cardiovascular:     Rate and Rhythm: Normal rate and regular rhythm.     Pulses: Normal pulses.     Heart sounds: Normal heart sounds.  Pulmonary:     Effort: Pulmonary effort is normal.     Breath sounds: Normal breath sounds.  Skin:    General: Skin is warm and dry.     Findings: Rash present. Rash is macular and  papular.          Comments: Maculopapular rash on stomach area  Macular rash on bilateral arms and between breasts  Neurological:     Mental Status: She is alert.    Assessment & Plan:  1. Acute maculopapular rash Patient presents today with a few days with a rash. Small macular rash present on anterior portion of left and right forearms and between breasts. Round circular maculopapular rash present to the right of patient's umbilicus and on her umbilicus. No defined borders, no red raised ring, and no crusting/scaling present. Unclear etiology of rash. Will treat inflammation and itchiness sensation with betamethasone dipropionate cream. Advised patient to apply sparingly to affected skin and to limit use to two weeks. Counseled her if this rash continues to be bothersome can refer to dermatology.   - betamethasone dipropionate 0.05 % cream; Apply topically 2 (two) times daily for 14 days.  Dispense: 30 g; Refill: 0  Return if symptoms worsen or fail to improve.    Alyson Reedy, FNP

## 2023-04-19 NOTE — Telephone Encounter (Signed)
Pharmacy clld needing the amount or a new RX indication the amount for insurance to approve it... the Betamethasone dipropionate

## 2023-04-20 ENCOUNTER — Encounter (HOSPITAL_BASED_OUTPATIENT_CLINIC_OR_DEPARTMENT_OTHER): Payer: Self-pay | Admitting: Family Medicine

## 2023-04-20 ENCOUNTER — Other Ambulatory Visit (HOSPITAL_BASED_OUTPATIENT_CLINIC_OR_DEPARTMENT_OTHER): Payer: Self-pay | Admitting: Family Medicine

## 2023-04-20 DIAGNOSIS — R21 Rash and other nonspecific skin eruption: Secondary | ICD-10-CM

## 2023-04-20 MED ORDER — BETAMETHASONE DIPROPIONATE 0.05 % EX CREA: TOPICAL_CREAM | Freq: Two times a day (BID) | CUTANEOUS | 0 refills | Status: AC

## 2023-04-21 NOTE — Telephone Encounter (Signed)
Mychart response sent back by pt:  Melissa Harding [facility]-Primary Care Clinical (supporting Alyson Reedy, FNP)14 minutes ago (8:48 AM)    Good morning [name] I  ran into another dilemma. The prescription is $56 not able to purchase it at this time. Is there any other Alternative cream? I  did try the RX Prescription discount, and it did bring it down to $33 that I'm  still not able to get it this time.   [name], please advise on this.

## 2023-05-24 ENCOUNTER — Encounter (HOSPITAL_BASED_OUTPATIENT_CLINIC_OR_DEPARTMENT_OTHER): Payer: Self-pay

## 2023-05-24 ENCOUNTER — Ambulatory Visit (HOSPITAL_BASED_OUTPATIENT_CLINIC_OR_DEPARTMENT_OTHER): Payer: Commercial Managed Care - HMO | Admitting: Family Medicine

## 2023-07-01 ENCOUNTER — Ambulatory Visit (HOSPITAL_BASED_OUTPATIENT_CLINIC_OR_DEPARTMENT_OTHER): Payer: Commercial Managed Care - HMO | Admitting: Family Medicine

## 2023-08-03 ENCOUNTER — Encounter: Payer: Self-pay | Admitting: Family

## 2023-08-03 ENCOUNTER — Inpatient Hospital Stay: Payer: Managed Care, Other (non HMO) | Attending: Hematology & Oncology

## 2023-08-03 ENCOUNTER — Inpatient Hospital Stay (HOSPITAL_BASED_OUTPATIENT_CLINIC_OR_DEPARTMENT_OTHER): Payer: Managed Care, Other (non HMO) | Admitting: Family

## 2023-08-03 VITALS — BP 136/67 | HR 77 | Temp 97.9°F | Resp 17 | Wt 188.1 lb

## 2023-08-03 DIAGNOSIS — C9110 Chronic lymphocytic leukemia of B-cell type not having achieved remission: Secondary | ICD-10-CM | POA: Diagnosis present

## 2023-08-03 LAB — SAVE SMEAR(SSMR), FOR PROVIDER SLIDE REVIEW

## 2023-08-03 LAB — CMP (CANCER CENTER ONLY)
ALT: 6 U/L (ref 0–44)
AST: 12 U/L — ABNORMAL LOW (ref 15–41)
Albumin: 4.6 g/dL (ref 3.5–5.0)
Alkaline Phosphatase: 59 U/L (ref 38–126)
Anion gap: 7 (ref 5–15)
BUN: 16 mg/dL (ref 6–20)
CO2: 28 mmol/L (ref 22–32)
Calcium: 10.2 mg/dL (ref 8.9–10.3)
Chloride: 104 mmol/L (ref 98–111)
Creatinine: 0.82 mg/dL (ref 0.44–1.00)
GFR, Estimated: >60 mL/min (ref >60)
Glucose, Bld: 90 mg/dL (ref 70–99)
Potassium: 4.6 mmol/L (ref 3.5–5.1)
Sodium: 139 mmol/L (ref 135–145)
Total Bilirubin: 0.4 mg/dL (ref 0.3–1.2)
Total Protein: 6.9 g/dL (ref 6.5–8.1)

## 2023-08-03 LAB — CBC WITH DIFFERENTIAL (CANCER CENTER ONLY)
Abs Immature Granulocytes: 0.02 10*3/uL (ref 0.00–0.07)
Basophils Absolute: 0.1 10*3/uL (ref 0.0–0.1)
Basophils Relative: 1 %
Eosinophils Absolute: 0.2 10*3/uL (ref 0.0–0.5)
Eosinophils Relative: 2 %
HCT: 43.1 % (ref 36.0–46.0)
Hemoglobin: 13.7 g/dL (ref 12.0–15.0)
Immature Granulocytes: 0 %
Lymphocytes Relative: 61 %
Lymphs Abs: 7.1 10*3/uL — ABNORMAL HIGH (ref 0.7–4.0)
MCH: 28.9 pg (ref 26.0–34.0)
MCHC: 31.8 g/dL (ref 30.0–36.0)
MCV: 90.9 fL (ref 80.0–100.0)
Monocytes Absolute: 0.4 10*3/uL (ref 0.1–1.0)
Monocytes Relative: 3 %
Neutro Abs: 3.9 10*3/uL (ref 1.7–7.7)
Neutrophils Relative %: 33 %
Platelet Count: 453 10*3/uL — ABNORMAL HIGH (ref 150–400)
RBC: 4.74 MIL/uL (ref 3.87–5.11)
RDW: 13.7 % (ref 11.5–15.5)
Smear Review: NORMAL
WBC Count: 11.7 10*3/uL — ABNORMAL HIGH (ref 4.0–10.5)
nRBC: 0 % (ref 0.0–0.2)

## 2023-08-03 NOTE — Progress Notes (Signed)
Hematology and Oncology Follow Up Visit  MORGANA BURROLA [id number] 14-Dec-1960 62 y.o. 08/03/2023   Principle Diagnosis:  CLL - Stage A   Current Therapy:        Observation   Interim History:  Ms. Kramar is here today for follow-up. She continues to do well and has no complaints at this time.  No adenopathy or lymphedema.  WBC count is stable at 11.7, Hgb 13.7 and platelets 453.  No fever, chills, n/v, cough, rash, dizziness, SOB, chest pain, palpitations, abdominal pain or changes in bowel or bladder habits.  No swelling, tenderness, numbness or tingling in her extremities.  No falls or syncope. No bruising or petechiae.  Occasional hot flashes and night sweats.  Appetite and hydration are good. Weight is 188 lbs.   ECOG Performance Status: 0 - Asymptomatic  Medications:  Allergies as of 08/03/2023       Reactions   Ampicillin Other (See Comments)   REACTION: seizures Did it involve swelling of the face/tongue/throat, SOB, or low BP? No Did it involve sudden or severe rash/hives, skin peeling, or any reaction on the inside of your mouth or nose? No Did you need to seek medical attention at a hospital or doctor's office? Yes When did it last happen?      36 yrs ago If all above answers are "NO", may proceed with cephalosporin use.   Percocet [oxycodone-acetaminophen] Nausea Only        Medication List        Accurate as of August 03, 2023  2:10 PM. If you have any questions, ask your nurse or doctor.          albuterol 108 (90 Base) MCG/ACT inhaler Commonly known as: VENTOLIN HFA Inhale 2 puffs into the lungs every 6 (six) hours as needed for wheezing or shortness of breath.   phentermine 37.5 MG tablet Commonly known as: ADIPEX-P Take 1 tablet (37.5 mg total) by mouth daily before breakfast.   promethazine-dextromethorphan 6.25-15 MG/5ML syrup Commonly known as: PROMETHAZINE-DM Take 5 mLs by mouth 4 (four) times daily as needed for cough (Maximum dose:  30mL in 24 hours).        Allergies:  Allergies  Allergen Reactions   Ampicillin Other (See Comments)    REACTION: seizures Did it involve swelling of the face/tongue/throat, SOB, or low BP? No Did it involve sudden or severe rash/hives, skin peeling, or any reaction on the inside of your mouth or nose? No Did you need to seek medical attention at a hospital or doctor's office? Yes When did it last happen?      36 yrs ago If all above answers are "NO", may proceed with cephalosporin use.   Percocet [Oxycodone-Acetaminophen] Nausea Only    Past Medical History, Surgical history, Social history, and Family History were reviewed and updated.  Review of Systems: All other 10 point review of systems is negative.   Physical Exam:  weight is 188 lb 1.3 oz (85.3 kg). Her oral temperature is 97.9 F (36.6 C). Her blood pressure is 136/67 and her pulse is 77. Her respiration is 17 and oxygen saturation is 100%.   Wt Readings from Last 3 Encounters:  08/03/23 188 lb 1.3 oz (85.3 kg)  04/19/23 189 lb 12.8 oz (86.1 kg)  03/31/23 191 lb 3.2 oz (86.7 kg)    Ocular: Sclerae unicteric, pupils equal, round and reactive to light Ear-nose-throat: Oropharynx clear, dentition fair Lymphatic: No cervical or supraclavicular adenopathy Lungs no rales or rhonchi, good  excursion bilaterally Heart regular rate and rhythm, no murmur appreciated Abd soft, nontender, positive bowel sounds MSK no focal spinal tenderness, no joint edema Neuro: non-focal, well-oriented, appropriate affect Breasts: Deferred   Lab Results  Component Value Date   WBC 11.7 (H) 08/03/2023   HGB 13.7 08/03/2023   HCT 43.1 08/03/2023   MCV 90.9 08/03/2023   PLT 453 (H) 08/03/2023   No results found for: "FERRITIN", "IRON", "TIBC", "UIBC", "IRONPCTSAT" Lab Results  Component Value Date   RBC 4.74 08/03/2023   Lab Results  Component Value Date   KPAFRELGTCHN 18.3 08/07/2020   LAMBDASER 8.7 08/07/2020   KAPLAMBRATIO  2.10 (H) 08/07/2020   Lab Results  Component Value Date   IGGSERUM 868 07/31/2021   IGA 47 (L) 07/31/2021   IGMSERUM 26 07/31/2021   Lab Results  Component Value Date   TOTALPROTELP 6.8 08/07/2020   ALBUMINELP 4.3 08/07/2020   A1GS 0.1 08/07/2020   A2GS 0.6 08/07/2020   BETS 0.9 08/07/2020   GAMS 0.9 08/07/2020   MSPIKE Not Observed 08/07/2020     Chemistry      Component Value Date/Time   NA 138 08/02/2022 1325   NA 138 12/23/2021 0926   K 5.0 08/02/2022 1325   CL 103 08/02/2022 1325   CO2 29 08/02/2022 1325   BUN 16 08/02/2022 1325   BUN 18 12/23/2021 0926   CREATININE 0.94 08/02/2022 1325   CREATININE 0.79 07/31/2021 1321      Component Value Date/Time   CALCIUM 10.2 08/02/2022 1325   ALKPHOS 55 08/02/2022 1325   AST 13 (L) 08/02/2022 1325   AST 14 (L) 07/31/2021 1321   ALT 8 08/02/2022 1325   ALT 10 07/31/2021 1321   BILITOT 0.3 08/02/2022 1325   BILITOT 0.2 12/23/2021 0926   BILITOT 0.3 07/31/2021 1321       Impression and Plan: Ms. Mcvaugh is a very pleasant 62 yo African American female with stage A CLL.  He counts have remained stable.  No intervention needed at this time.  Follow-up in 1 year.   Eileen Stanford, NP 9/18/20242:10 PM

## 2023-08-08 ENCOUNTER — Encounter (HOSPITAL_BASED_OUTPATIENT_CLINIC_OR_DEPARTMENT_OTHER): Payer: Self-pay | Admitting: *Deleted

## 2023-08-08 ENCOUNTER — Ambulatory Visit (HOSPITAL_BASED_OUTPATIENT_CLINIC_OR_DEPARTMENT_OTHER): Payer: Commercial Managed Care - HMO | Admitting: *Deleted

## 2023-08-08 VITALS — BP 121/83 | HR 75 | Temp 98.1°F | Ht 62.0 in | Wt 185.0 lb

## 2023-08-08 DIAGNOSIS — Z23 Encounter for immunization: Secondary | ICD-10-CM

## 2023-08-08 NOTE — Progress Notes (Signed)
Patient is here today to receive her flu shot. Is well today. Denies any concerns for today. Flu shot administered. Patient tolerated it well.

## 2023-08-15 ENCOUNTER — Encounter (HOSPITAL_BASED_OUTPATIENT_CLINIC_OR_DEPARTMENT_OTHER): Payer: Self-pay | Admitting: Family Medicine

## 2023-08-15 ENCOUNTER — Ambulatory Visit (HOSPITAL_BASED_OUTPATIENT_CLINIC_OR_DEPARTMENT_OTHER): Payer: Managed Care, Other (non HMO) | Admitting: Family Medicine

## 2023-08-15 VITALS — BP 125/77 | HR 69 | Temp 98.5°F | Ht 62.0 in | Wt 190.0 lb

## 2023-08-15 DIAGNOSIS — B9689 Other specified bacterial agents as the cause of diseases classified elsewhere: Secondary | ICD-10-CM

## 2023-08-15 DIAGNOSIS — Z6837 Body mass index (BMI) 37.0-37.9, adult: Secondary | ICD-10-CM | POA: Diagnosis not present

## 2023-08-15 DIAGNOSIS — J019 Acute sinusitis, unspecified: Secondary | ICD-10-CM | POA: Diagnosis not present

## 2023-08-15 MED ORDER — PROMETHAZINE-DM 6.25-15 MG/5ML PO SYRP: 5.0000 mL | ORAL_SOLUTION | Freq: Four times a day (QID) | ORAL | 0 refills | Status: DC | PRN

## 2023-08-15 MED ORDER — DOXYCYCLINE HYCLATE 100 MG PO TABS: 100.0000 mg | ORAL_TABLET | Freq: Two times a day (BID) | ORAL | 0 refills | Status: AC

## 2023-08-15 NOTE — Progress Notes (Signed)
Acute Office Visit  Subjective:     Patient ID: Melissa Harding, female    DOB: [birth year], 61 y.o.   MRN: [id number]  Chief Complaint  Patient presents with   Cough    Coughing up yellow mucus. Has tried OTC tylenol cold and flu no big relief    Sore Throat    Did an at home covid test today, was negative   Nasal Congestion    Ongoing for about a week, blowing out green mucus, headaches/sinus pressure   Melissa Harding is a 61 year old female patient who presents today for sinus concerns. She reports she woke up this morning with sore throat and voice hoarseness. Nasal congestion and coughing phlegm up for the past week, but coughing up more today. Reports headache, sinus pressure & pain. Denies shortness of breath.  COVID test negative at home.   She has a f/u appt for weight loss on 10/3 but was wondering if this could be addressed at this visit. Patient reports she does not need refills of this medication since she has not been taking medication while she has been sick.  Weight loss med- phentermine 37.5mg    Review of Systems  Constitutional:  Negative for chills, fever and malaise/fatigue.  HENT:  Positive for congestion, sinus pain and sore throat. Negative for ear pain.   Respiratory:  Positive for cough and sputum production. Negative for shortness of breath and wheezing.   Cardiovascular:  Negative for chest pain and palpitations.  Gastrointestinal:  Negative for abdominal pain, nausea and vomiting. Constipation: decreased appetite. Musculoskeletal:  Negative for myalgias.  Neurological:  Positive for headaches.       Objective:    BP 125/77   Pulse 69   Temp 98.5 F (36.9 C) (Oral)   Ht 5\' 2"  (1.575 m)   Wt 190 lb (86.2 kg)   SpO2 100%   BMI 34.75 kg/m   Physical Exam Vitals reviewed.  Constitutional:      Appearance: Normal appearance. She is well-developed.  HENT:     Right Ear: Tympanic membrane and ear canal normal.     Left Ear: Tympanic membrane and  ear canal normal.     Nose: Congestion and rhinorrhea present.     Right Turbinates: Swollen.     Left Turbinates: Swollen.     Right Sinus: Maxillary sinus tenderness present. No frontal sinus tenderness.     Left Sinus: Maxillary sinus tenderness present. No frontal sinus tenderness.     Mouth/Throat:     Pharynx: Posterior oropharyngeal erythema and postnasal drip present.     Tonsils: No tonsillar exudate or tonsillar abscesses.  Cardiovascular:     Rate and Rhythm: Normal rate and regular rhythm.     Pulses: Normal pulses.     Heart sounds: Normal heart sounds.  Pulmonary:     Effort: Pulmonary effort is normal.     Breath sounds: Normal breath sounds and air entry.  Neurological:     Mental Status: She is alert.  Psychiatric:        Mood and Affect: Mood normal.        Behavior: Behavior normal.    Assessment & Plan:   1. Acute bacterial sinusitis Patient presents today for almost 10 days with nasal congestion, phlegm production, cough, sinus pressure/pain and headache. She reports this morning her throat started to hurt and her voice became hoarse. Denies fatigue, body aches, fever/chills, N/V, abdominal pain, shortness of breath, chest pain. Physical exam remarkable for erythema  and post-nasal drip present in the pharynx. Maxillary sinus tenderness with palpation present. Turbinates swollen and erythematous. Bilateral TM intact, without bulging or erythema. Based on symptoms and duration of symptoms, will treat patient for acute bacterial sinusitis. Medication allergies reviewed. Prescription sent for cough medication and antibiotic.  - promethazine-dextromethorphan (PROMETHAZINE-DM) 6.25-15 MG/5ML syrup; Take 5 mLs by mouth 4 (four) times daily as needed for cough (Maximum dose: 30mL in 24 hours).  Dispense: 118 mL; Refill: 0 - doxycycline (VIBRA-TABS) 100 MG tablet; Take 1 tablet (100 mg total) by mouth 2 (two) times daily for 7 days.  Dispense: 14 tablet; Refill: 0  2. BMI  37.0-37.9, adult Patient presents today for weight loss follow-up. From her last office visit with the office, she has gained 2 lbs. She reports that she has not been taking the phentermine as prescribed since she has been sick and did not want to take it while she was not feeling well. She does not need a refill today. Advised patient to start medication after she completes course of antibiotics. Follow-up in 3 months for weight loss management.    Return in about 3 months (around 11/14/2023) for weight loss meds.  Alyson Reedy, FNP

## 2023-08-18 ENCOUNTER — Ambulatory Visit (HOSPITAL_BASED_OUTPATIENT_CLINIC_OR_DEPARTMENT_OTHER): Payer: Commercial Managed Care - HMO | Admitting: Family Medicine

## 2023-08-22 ENCOUNTER — Encounter: Payer: Self-pay | Admitting: Physician Assistant

## 2023-08-22 ENCOUNTER — Ambulatory Visit (INDEPENDENT_AMBULATORY_CARE_PROVIDER_SITE_OTHER): Payer: Managed Care, Other (non HMO)

## 2023-08-22 ENCOUNTER — Ambulatory Visit (INDEPENDENT_AMBULATORY_CARE_PROVIDER_SITE_OTHER): Payer: Managed Care, Other (non HMO) | Admitting: Physician Assistant

## 2023-08-22 DIAGNOSIS — M1711 Unilateral primary osteoarthritis, right knee: Secondary | ICD-10-CM

## 2023-08-22 MED ORDER — LIDOCAINE HCL 1 % IJ SOLN
3.0000 mL | INTRAMUSCULAR | Status: AC | PRN
Start: 2023-08-22 — End: 2023-08-22
  Administered 2023-08-22: 3 mL

## 2023-08-22 MED ORDER — METHYLPREDNISOLONE ACETATE 40 MG/ML IJ SUSP
40.0000 mg | INTRAMUSCULAR | Status: AC | PRN
Start: 2023-08-22 — End: 2023-08-22
  Administered 2023-08-22: 40 mg via INTRA_ARTICULAR

## 2023-08-22 NOTE — Progress Notes (Addendum)
Office Visit Note   Patient: Melissa Harding           Date of Birth: 1961-03-29           MRN: [id number] Visit Date: 08/22/2023              Requested by: Alyson Reedy, FNP [address] [place],  [place] [address] PCP: Alyson Reedy, FNP   Assessment & Plan: Visit Diagnoses:  1. Primary osteoarthritis of right knee     Plan: Recommend quad strengthening.  Knee friendly exercises discussed.  We will see her back as needed pain persist or becomes worse.  May consider viscosupplementation in the future.  Follow-Up Instructions: Return if symptoms worsen or fail to improve.   Orders:  Orders Placed This Encounter  Procedures   Large Joint Inj: R knee   XR Knee 1-2 Views Right   Meds ordered this encounter  Medications   lidocaine (XYLOCAINE) 1 % (with pres) injection 3 mL   methylPREDNISolone acetate (DEPO-MEDROL) injection 40 mg      Procedures: Large Joint Inj: R knee on 08/22/2023 2:14 PM Indications: pain Details: 22 G 1.5 in needle, anterolateral approach  Arthrogram: No  Medications: 3 mL lidocaine 1 %; 40 mg methylPREDNISolone acetate 40 MG/ML Outcome: tolerated well, no immediate complications Procedure, treatment alternatives, risks and benefits explained, specific risks discussed. Consent was given by the patient. Immediately prior to procedure a time out was called to verify the correct patient, procedure, equipment, support staff and site/side marked as required. Patient was prepped and draped in the usual sterile fashion.       Clinical Data: No additional findings.   Subjective: Chief Complaint  Patient presents with   Right Knee - Pain, Follow-up    HPI [name] returns today in follow-up for right knee pain.  States her knee continues to swell at times.  Ranks her pain to be 8 out of 10 at worst.  She sleeps occasionally with a pillow between her knees due to the sharp pain medial aspect of the right knee.  Describes no  mechanical symptoms.  She has been taking turmeric and using Voltaren gel on the knee and feels it is somewhat beneficial.  Last injection the knee was over a year ago.  She states that she has had no injury to the knee.  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  She underwent a partial medial meniscectomy due to a complex medial meniscal tear.  Review of Systems  Constitutional:  Negative for chills and fever.     Objective: Vital Signs: There were no vitals taken for this visit.  Physical Exam Constitutional:      Appearance: She is not ill-appearing or diaphoretic.  Pulmonary:     Effort: Pulmonary effort is normal.  Neurological:     Mental Status: She is alert and oriented to person, place, and time.  Psychiatric:        Mood and Affect: Mood normal.     Ortho Exam Bilateral knees: No abnormal warmth erythema about the knee.  Bilateral knees hyperextend.  Tenderness along the medial joint line of the right knee.  No tenderness about the left knee.  No instability valgus varus stressing of either knee.  Calves are supple and nontender bilaterally. Specialty Comments:  No specialty comments available.  Imaging: XR Knee 1-2 Views Right  Result Date: 08/22/2023 Right knee 2 views: Slight narrowing medial joint  line.  Otherwise knee is well-preserved.  No acute fractures or acute findings.    PMFS History: Patient Active Problem List   Diagnosis Date Noted   Acute maculopapular rash 04/19/2023   Abscess 06/22/2022   Post-menopausal bleeding 02/16/2022   Acute left-sided low back pain without sciatica 02/16/2022   BMI 37.0-37.9, adult 12/25/2021   Chronic left shoulder pain 10/22/2021   Primary osteoarthritis of right knee 07/21/2021   CLL (chronic lymphocytic leukemia) (HCC) 08/07/2020   Encounter for annual physical exam 08/07/2020   Personal history of CLL (chronic lymphocytic leukemia) 11/01/2019    Lymphocytosis 02/05/2019   MENOPAUSAL SYNDROME 03/05/2009   Past Medical History:  Diagnosis Date   Acute medial meniscus tear, right, subsequent encounter 03/11/2021   CLL (chronic lymphocytic leukemia) (HCC) 08/07/2020   Goals of care, counseling/discussion 08/07/2020   MMT (medial meniscus tear)    right    Family History  Problem Relation Age of Onset   Hypertension Mother    Hypertension Father    Cancer Paternal Grandmother    Stomach cancer Paternal Grandmother    Heart attack Paternal Grandfather    Colon cancer Neg Hx    Colon polyps Neg Hx    Esophageal cancer Neg Hx    Rectal cancer Neg Hx     Past Surgical History:  Procedure Laterality Date   APPENDECTOMY     CESAREAN SECTION     2 previous   ECTOPIC PREGNANCY SURGERY     x2   KNEE ARTHROSCOPY Right 03/12/2021   Procedure: RIGHT KNEE ARTHROSCOPY WITH PARTIAL MEDIAL MENISCECTOMY;  Surgeon: Kathryne Hitch, MD;  Location: [facility];  Service: Orthopedics;  Laterality: Right;   LAPAROSCOPIC TUBAL LIGATION     TUBAL LIGATION     Social History   Occupational History   Occupation: part time    Comment: self employed  Tobacco Use   Smoking status: Former    Current packs/day: 0.00    Types: Cigarettes    Quit date: 2000    Years since quitting: 24.7   Smokeless tobacco: Never  Vaping Use   Vaping status: Never Used  Substance and Sexual Activity   Alcohol use: Yes    Alcohol/week: 1.0 standard drink of alcohol    Types: 1 Glasses of wine per week    Comment: social   Drug use: No   Sexual activity: Yes    Birth control/protection: Surgical    Comment: BTL

## 2023-09-19 ENCOUNTER — Other Ambulatory Visit: Payer: Self-pay | Admitting: Nurse Practitioner

## 2023-09-19 DIAGNOSIS — Z1231 Encounter for screening mammogram for malignant neoplasm of breast: Secondary | ICD-10-CM

## 2023-09-22 ENCOUNTER — Encounter (HOSPITAL_BASED_OUTPATIENT_CLINIC_OR_DEPARTMENT_OTHER): Payer: Self-pay | Admitting: Family Medicine

## 2023-09-29 ENCOUNTER — Other Ambulatory Visit (HOSPITAL_BASED_OUTPATIENT_CLINIC_OR_DEPARTMENT_OTHER): Payer: Self-pay

## 2023-09-29 MED ORDER — SHINGRIX 50 MCG/0.5ML IM SUSR
0.5000 mL | Freq: Once | INTRAMUSCULAR | 0 refills | Status: AC
  Filled 2023-09-29: qty 0.5, 1d supply, fill #0

## 2023-10-06 ENCOUNTER — Encounter (HOSPITAL_BASED_OUTPATIENT_CLINIC_OR_DEPARTMENT_OTHER): Payer: Self-pay | Admitting: Family Medicine

## 2023-10-26 ENCOUNTER — Ambulatory Visit
Admission: RE | Admit: 2023-10-26 | Discharge: 2023-10-26 | Disposition: A | Discharge status: Home / Self Care | Payer: Commercial Managed Care - HMO | Source: Ambulatory Visit | Attending: Nurse Practitioner | Admitting: Nurse Practitioner

## 2023-10-26 DIAGNOSIS — Z1231 Encounter for screening mammogram for malignant neoplasm of breast: Secondary | ICD-10-CM

## 2023-11-25 ENCOUNTER — Other Ambulatory Visit (HOSPITAL_BASED_OUTPATIENT_CLINIC_OR_DEPARTMENT_OTHER): Payer: Self-pay

## 2023-12-02 ENCOUNTER — Other Ambulatory Visit: Payer: Self-pay | Admitting: Physician Assistant

## 2023-12-02 DIAGNOSIS — M25512 Pain in left shoulder: Secondary | ICD-10-CM

## 2023-12-07 ENCOUNTER — Other Ambulatory Visit (HOSPITAL_BASED_OUTPATIENT_CLINIC_OR_DEPARTMENT_OTHER): Payer: Self-pay

## 2023-12-08 ENCOUNTER — Other Ambulatory Visit (HOSPITAL_BASED_OUTPATIENT_CLINIC_OR_DEPARTMENT_OTHER): Payer: Self-pay

## 2023-12-29 ENCOUNTER — Ambulatory Visit: Payer: BLUE CROSS/BLUE SHIELD | Admitting: Physician Assistant

## 2024-01-27 ENCOUNTER — Other Ambulatory Visit (HOSPITAL_BASED_OUTPATIENT_CLINIC_OR_DEPARTMENT_OTHER): Payer: Self-pay

## 2024-02-07 ENCOUNTER — Other Ambulatory Visit (HOSPITAL_BASED_OUTPATIENT_CLINIC_OR_DEPARTMENT_OTHER): Payer: Self-pay

## 2024-02-07 ENCOUNTER — Ambulatory Visit (INDEPENDENT_AMBULATORY_CARE_PROVIDER_SITE_OTHER): Admitting: Family Medicine

## 2024-02-07 ENCOUNTER — Encounter (HOSPITAL_BASED_OUTPATIENT_CLINIC_OR_DEPARTMENT_OTHER): Payer: Self-pay | Admitting: Family Medicine

## 2024-02-07 VITALS — BP 131/78 | HR 72 | Ht 62.0 in | Wt 193.7 lb

## 2024-02-07 DIAGNOSIS — R21 Rash and other nonspecific skin eruption: Secondary | ICD-10-CM | POA: Diagnosis not present

## 2024-02-07 DIAGNOSIS — R7303 Prediabetes: Secondary | ICD-10-CM | POA: Diagnosis not present

## 2024-02-07 DIAGNOSIS — R682 Dry mouth, unspecified: Secondary | ICD-10-CM | POA: Diagnosis not present

## 2024-02-07 LAB — POCT GLYCOSYLATED HEMOGLOBIN (HGB A1C)
HbA1c POC (<> result, manual entry): 5.6 % (ref 4.0–5.6)
HbA1c, POC (controlled diabetic range): 5.6 % (ref 0.0–7.0)
Hemoglobin A1C: 5.6 % (ref 4.0–5.6)

## 2024-02-07 MED ORDER — SHINGRIX 50 MCG/0.5ML IM SUSR
0.5000 mL | Freq: Once | INTRAMUSCULAR | 0 refills | Status: AC
  Filled 2024-02-07: qty 0.5, 1d supply, fill #0

## 2024-02-07 MED ORDER — TRIAMCINOLONE ACETONIDE 0.1 % EX CREA: 1.0000 | TOPICAL_CREAM | Freq: Two times a day (BID) | CUTANEOUS | 1 refills | Status: AC

## 2024-02-07 NOTE — Progress Notes (Signed)
    Procedures performed today:    None.  Independent interpretation of notes and tests performed by another provider:   None.  Brief History, Exam, Impression, and Recommendations:    BP 131/78 (BP Location: Left Arm, Patient Position: Sitting, Cuff Size: Normal)   Pulse 72   Ht 5\' 2"  (1.575 m)   Wt 193 lb 11.2 oz (87.9 kg)   SpO2 99%   BMI 35.43 kg/m   Rash Assessment & Plan: Patient reports that she continue continues to have intermittent issues with rash.  Has been seen in our office previously related to this.  Was started on steroid cream with consideration of referral to dermatology.  She reports that rash has continued, however she did not reach back out to [place] regarding this and has not had referral placed.  She did have appointment with her oncologist since last appointment in our office, however did not discuss rash with them at that time.  Rash will occur sporadically over extremities and trunk.  Will be itchy when it occurs.  She reports that she has been using an old triamcinolone prescription that she has at home with some relief appreciated. On exam, she does have some areas of small lesions present over upper extremity, right side of chest wall.  No current oozing or drainage present.  No significant warmth. Given appearance of rash, feel that it is less likely related to CLL, however still would recommend that she discuss this with her oncologist.  Given chronicity of symptoms, I also feel that she would benefit from evaluation with dermatology, patient amenable, referral placed today.  Orders: -     Ambulatory referral to Dermatology  Dry mouth Assessment & Plan: Patient notes that she has had intermittent dry mouth, however primarily affects right side of mouth.  Has occurred intermittently over past months.  Has not had any associated pain.  She does have a dentist that she sees, however is overdue for follow-up.  She has not appreciated any swelling along right  side of jaw or face. On exam, patient is in no acute distress.  Intraorally, no obvious mucosal abnormality appreciated.  Normal dentition, is missing lower molar along right side.  Extraorally, no obvious abnormality on palpation through bilateral cheeks along maxilla or mandible.  No abnormality on palpation through submandibular area. Uncertain etiology based on normal exam in office today.  She does have dentist that she follows with and is overdue for follow-up.  Recommend that she arrange for evaluation with her dentist with further intraoral evaluation, consideration of possible imaging.   Prediabetes Assessment & Plan: Prior A1c elevated within prediabetes range.  Patient has been focusing on lifestyle modifications, A1c last checked about 2 years ago.  Has had some dry mouth as above, however no other obvious symptoms which would suggest overt diabetes such as polyuria and polydipsia. We can recheck A1c today for monitoring  Orders: -     POCT glycosylated hemoglobin (Hb A1C)  Other orders -     Triamcinolone Acetonide; Apply 1 Application topically 2 (two) times daily.  Dispense: 30 g; Refill: 1   ___________________________________________ Aeon Kessner de [place], MD, ABFM, CAQSM Primary Care and Sports Medicine [facility]

## 2024-02-07 NOTE — Patient Instructions (Signed)

## 2024-02-07 NOTE — Assessment & Plan Note (Signed)
 Patient reports that she continue continues to have intermittent issues with rash.  Has been seen in our office previously related to this.  Was started on steroid cream with consideration of referral to dermatology.  She reports that rash has continued, however she did not reach back out to [place] regarding this and has not had referral placed.  She did have appointment with her oncologist since last appointment in our office, however did not discuss rash with them at that time.  Rash will occur sporadically over extremities and trunk.  Will be itchy when it occurs.  She reports that she has been using an old triamcinolone prescription that she has at home with some relief appreciated. On exam, she does have some areas of small lesions present over upper extremity, right side of chest wall.  No current oozing or drainage present.  No significant warmth. Given appearance of rash, feel that it is less likely related to CLL, however still would recommend that she discuss this with her oncologist.  Given chronicity of symptoms, I also feel that she would benefit from evaluation with dermatology, patient amenable, referral placed today.

## 2024-02-07 NOTE — Assessment & Plan Note (Signed)
 Patient notes that she has had intermittent dry mouth, however primarily affects right side of mouth.  Has occurred intermittently over past months.  Has not had any associated pain.  She does have a dentist that she sees, however is overdue for follow-up.  She has not appreciated any swelling along right side of jaw or face. On exam, patient is in no acute distress.  Intraorally, no obvious mucosal abnormality appreciated.  Normal dentition, is missing lower molar along right side.  Extraorally, no obvious abnormality on palpation through bilateral cheeks along maxilla or mandible.  No abnormality on palpation through submandibular area. Uncertain etiology based on normal exam in office today.  She does have dentist that she follows with and is overdue for follow-up.  Recommend that she arrange for evaluation with her dentist with further intraoral evaluation, consideration of possible imaging.

## 2024-02-07 NOTE — Assessment & Plan Note (Signed)
 Prior A1c elevated within prediabetes range.  Patient has been focusing on lifestyle modifications, A1c last checked about 2 years ago.  Has had some dry mouth as above, however no other obvious symptoms which would suggest overt diabetes such as polyuria and polydipsia. We can recheck A1c today for monitoring

## 2024-02-25 ENCOUNTER — Other Ambulatory Visit: Payer: Self-pay

## 2024-02-25 ENCOUNTER — Ambulatory Visit (HOSPITAL_COMMUNITY): Admission: EM | Admit: 2024-02-25 | Discharge: 2024-02-25 | Disposition: A | Discharge status: Home / Self Care

## 2024-02-25 ENCOUNTER — Encounter (HOSPITAL_COMMUNITY): Payer: Self-pay | Admitting: *Deleted

## 2024-02-25 DIAGNOSIS — R21 Rash and other nonspecific skin eruption: Secondary | ICD-10-CM | POA: Diagnosis not present

## 2024-02-25 MED ORDER — PREDNISONE 20 MG PO TABS: ORAL_TABLET | ORAL | 0 refills | Status: AC

## 2024-02-25 NOTE — Discharge Instructions (Addendum)
 1. Rash and nonspecific skin eruption (Primary) - Ambulatory referral to Dermatology for follow-up evaluation if symptoms do not subside with oral prednisone treatment. - predniSONE (DELTASONE) 20 MG tablet; Take 3 tablets (60 mg total) by mouth daily for 3 days, THEN 2 tablets (40 mg total) daily for 4 days, THEN 1 tablet (20 mg total) daily for 3 days.  Dispense: 20 tablet; Refill: 0 -Continue to monitor symptoms for any change in severity if there is any escalation of current symptoms or development of new symptoms follow-up in ER for further evaluation and management.

## 2024-02-25 NOTE — ED Triage Notes (Signed)
 PT reports having a rash that comes and goes. Pt was seen by PCP for same rash but the med given did not work.

## 2024-02-25 NOTE — ED Provider Notes (Signed)
 [facility]  Note:  This document was prepared using Dragon voice recognition software and may include unintentional dictation errors.  MRN: [id number] DOB: Mar 22, 1961  Subjective:   Melissa Harding is a 63 y.o. female presenting for persistent pruritic rash to bilateral arms, chest, neck, upper back off and on for 2 to 3 weeks.  Patient reports that she was seen by primary care provider who prescribed triamcinolone 0.1% ointment which did not improve symptoms.  Patient contacted dermatology but was told that no appointments were available until July.  Patient denies any swelling to the lips or tongue, no difficulty swallowing, no difficulty breathing or wheezing, no stridor.  No current facility-administered medications for this encounter.  Current Outpatient Medications:    predniSONE (DELTASONE) 20 MG tablet, Take 3 tablets (60 mg total) by mouth daily for 3 days, THEN 2 tablets (40 mg total) daily for 4 days, THEN 1 tablet (20 mg total) daily for 3 days., Disp: 20 tablet, Rfl: 0   phentermine (ADIPEX-P) 37.5 MG tablet, Take 1 tablet (37.5 mg total) by mouth daily before breakfast. (Patient not taking: Reported on 02/07/2024), Disp: 30 tablet, Rfl: 2   triamcinolone cream (KENALOG) 0.1 %, Apply 1 Application topically 2 (two) times daily., Disp: 30 g, Rfl: 1   Allergies  Allergen Reactions   Ampicillin Other (See Comments)    REACTION: seizures Did it involve swelling of the face/tongue/throat, SOB, or low BP? No Did it involve sudden or severe rash/hives, skin peeling, or any reaction on the inside of your mouth or nose? No Did you need to seek medical attention at a hospital or doctor's office? Yes When did it last happen?      36 yrs ago If all above answers are "NO", may proceed with cephalosporin use.   Percocet [Oxycodone-Acetaminophen] Nausea And Vomiting    Past Medical History:  Diagnosis Date   Acute medial meniscus tear, right, subsequent encounter 03/11/2021    CLL (chronic lymphocytic leukemia) (HCC) 08/07/2020   Goals of care, counseling/discussion 08/07/2020   MMT (medial meniscus tear)    right     Past Surgical History:  Procedure Laterality Date   APPENDECTOMY     CESAREAN SECTION     2 previous   ECTOPIC PREGNANCY SURGERY     x2   KNEE ARTHROSCOPY Right 03/12/2021   Procedure: RIGHT KNEE ARTHROSCOPY WITH PARTIAL MEDIAL MENISCECTOMY;  Surgeon: Arnie Lao, MD;  Location: [facility];  Service: Orthopedics;  Laterality: Right;   LAPAROSCOPIC TUBAL LIGATION     TUBAL LIGATION      Family History  Problem Relation Age of Onset   Hypertension Mother    Hypertension Father    Cancer Paternal Grandmother    Stomach cancer Paternal Grandmother    Heart attack Paternal Grandfather    Colon cancer Neg Hx    Colon polyps Neg Hx    Esophageal cancer Neg Hx    Rectal cancer Neg Hx    BRCA 1/2 Neg Hx    Breast cancer Neg Hx     Social History   Tobacco Use   Smoking status: Former    Current packs/day: 0.00    Types: Cigarettes    Quit date: 2000    Years since quitting: 25.2    Passive exposure: Past   Smokeless tobacco: Never  Vaping Use   Vaping status: Never Used  Substance Use Topics   Alcohol use: Yes    Alcohol/week: 1.0 standard drink of  alcohol    Types: 1 Glasses of wine per week    Comment: social   Drug use: No    ROS Refer to HPI for ROS details.  Objective:   Vitals: BP (!) 164/94   Pulse 65   Temp 97.8 F (36.6 C)   Resp 20   SpO2 98%   Physical Exam Vitals and nursing note reviewed.  Constitutional:      General: She is not in acute distress.    Appearance: Normal appearance. She is well-developed. She is not ill-appearing or toxic-appearing.  HENT:     Head: Normocephalic.     Mouth/Throat:     Mouth: Mucous membranes are moist.     Pharynx: Oropharynx is clear.  Cardiovascular:     Rate and Rhythm: Normal rate.  Pulmonary:     Effort: Pulmonary effort is  normal. No respiratory distress.     Breath sounds: No stridor.  Skin:    General: Skin is warm and dry.     Capillary Refill: Capillary refill takes less than 2 seconds.     Findings: Rash (Generalized papular pruritic rash to anterior chest, bilateral arms, neck, upper back, no surrounding erythema, no induration, no drainage.) present.  Neurological:     General: No focal deficit present.     Mental Status: She is alert and oriented to person, place, and time.  Psychiatric:        Mood and Affect: Mood normal.     Procedures  No results found for this or any previous visit (from the past 24 hours).  Assessment and Plan :   1. Rash and nonspecific skin eruption (Primary) - Ambulatory referral to Dermatology for follow-up evaluation if symptoms do not subside with oral prednisone treatment. - predniSONE (DELTASONE) 20 MG tablet; Take 3 tablets (60 mg total) by mouth daily for 3 days, THEN 2 tablets (40 mg total) daily for 4 days, THEN 1 tablet (20 mg total) daily for 3 days.  Dispense: 20 tablet; Refill: 0 -Continue to monitor symptoms for any change in severity if there is any escalation of current symptoms or development of new symptoms follow-up in ER for further evaluation and management.  Montina Dorrance B Deunte Bledsoe   Kandiss Ihrig, [place], [place] 02/25/24 1042

## 2024-05-07 ENCOUNTER — Ambulatory Visit: Admitting: Physician Assistant

## 2024-08-01 ENCOUNTER — Inpatient Hospital Stay: Payer: Managed Care, Other (non HMO) | Attending: Hematology & Oncology

## 2024-08-01 ENCOUNTER — Inpatient Hospital Stay (HOSPITAL_BASED_OUTPATIENT_CLINIC_OR_DEPARTMENT_OTHER): Payer: Commercial Managed Care - HMO | Admitting: Medical Oncology

## 2024-08-01 ENCOUNTER — Encounter: Payer: Self-pay | Admitting: Medical Oncology

## 2024-08-01 VITALS — BP 141/69 | HR 75 | Temp 98.8°F | Resp 17 | Wt 195.8 lb

## 2024-08-01 DIAGNOSIS — C9110 Chronic lymphocytic leukemia of B-cell type not having achieved remission: Secondary | ICD-10-CM | POA: Insufficient documentation

## 2024-08-01 LAB — CBC WITH DIFFERENTIAL (CANCER CENTER ONLY)
Abs Immature Granulocytes: 0.02 K/uL (ref 0.00–0.07)
Basophils Absolute: 0.1 K/uL (ref 0.0–0.1)
Basophils Relative: 1 %
Eosinophils Absolute: 0.3 K/uL (ref 0.0–0.5)
Eosinophils Relative: 2 %
HCT: 42.8 % (ref 36.0–46.0)
Hemoglobin: 14 g/dL (ref 12.0–15.0)
Immature Granulocytes: 0 %
Lymphocytes Relative: 68 %
Lymphs Abs: 9.2 K/uL — ABNORMAL HIGH (ref 0.7–4.0)
MCH: 29.5 pg (ref 26.0–34.0)
MCHC: 32.7 g/dL (ref 30.0–36.0)
MCV: 90.1 fL (ref 80.0–100.0)
Monocytes Absolute: 0.4 K/uL (ref 0.1–1.0)
Monocytes Relative: 3 %
Neutro Abs: 3.5 K/uL (ref 1.7–7.7)
Neutrophils Relative %: 26 %
Platelet Count: 437 K/uL — ABNORMAL HIGH (ref 150–400)
RBC: 4.75 MIL/uL (ref 3.87–5.11)
RDW: 13.5 % (ref 11.5–15.5)
Smear Review: NORMAL
WBC Count: 13.4 K/uL — ABNORMAL HIGH (ref 4.0–10.5)
nRBC: 0 % (ref 0.0–0.2)

## 2024-08-01 LAB — CMP (CANCER CENTER ONLY)
ALT: 9 U/L (ref 0–44)
AST: 19 U/L (ref 15–41)
Albumin: 4.5 g/dL (ref 3.5–5.0)
Alkaline Phosphatase: 65 U/L (ref 38–126)
Anion gap: 10 (ref 5–15)
BUN: 13 mg/dL (ref 8–23)
CO2: 24 mmol/L (ref 22–32)
Calcium: 9.6 mg/dL (ref 8.9–10.3)
Chloride: 108 mmol/L (ref 98–111)
Creatinine: 0.77 mg/dL (ref 0.44–1.00)
GFR, Estimated: >60 mL/min (ref >60)
Glucose, Bld: 87 mg/dL (ref 70–99)
Potassium: 4.6 mmol/L (ref 3.5–5.1)
Sodium: 141 mmol/L (ref 135–145)
Total Bilirubin: 0.3 mg/dL (ref 0.0–1.2)
Total Protein: 7.4 g/dL (ref 6.5–8.1)

## 2024-08-01 LAB — SAVE SMEAR(SSMR), FOR PROVIDER SLIDE REVIEW

## 2024-08-01 NOTE — Progress Notes (Signed)
 Hematology and Oncology Follow Up Visit  Melissa Harding [id number] 11-15-1961 63 y.o. 08/01/2024   Principle Diagnosis:  CLL - Stage A   Current Therapy:        Observation   Interim History:  Melissa Harding is here today for follow-up  She states that she has been well and has no concerns today  No adenopathy or lymphedema.   No fever, chills, n/v, cough, rash, dizziness, SOB, chest pain, palpitations, abdominal pain or changes in bowel or bladder habits.  No swelling, tenderness, numbness or tingling in her extremities.  No falls or syncope. No bruising or petechiae.  Occasional hot flashes and night sweats.  Appetite and hydration are good.  Wt Readings from Last 3 Encounters:  08/01/24 195 lb 12.8 oz (88.8 kg)  02/07/24 193 lb 11.2 oz (87.9 kg)  08/15/23 190 lb (86.2 kg)     ECOG Performance Status: 0 - Asymptomatic  Medications:  Allergies as of 08/01/2024       Reactions   Ampicillin Other (See Comments)   REACTION: seizures Did it involve swelling of the face/tongue/throat, SOB, or low BP? No Did it involve sudden or severe rash/hives, skin peeling, or any reaction on the inside of your mouth or nose? No Did you need to seek medical attention at a hospital or doctor's office? Yes When did it last happen?      36 yrs ago If all above answers are NO, may proceed with cephalosporin use.   Percocet [oxycodone-acetaminophen ] Nausea And Vomiting        Medication List        Accurate as of August 01, 2024  1:52 PM. If you have any questions, ask your nurse or doctor.          doxylamine (Sleep) 25 MG tablet Commonly known as: UNISOM Take 25 mg by mouth at bedtime as needed.   phentermine  37.5 MG tablet Commonly known as: ADIPEX-P  Take 1 tablet (37.5 mg total) by mouth daily before breakfast.   triamcinolone  cream 0.1 % Commonly known as: KENALOG  Apply 1 Application topically 2 (two) times daily.        Allergies:  Allergies  Allergen  Reactions   Ampicillin Other (See Comments)    REACTION: seizures Did it involve swelling of the face/tongue/throat, SOB, or low BP? No Did it involve sudden or severe rash/hives, skin peeling, or any reaction on the inside of your mouth or nose? No Did you need to seek medical attention at a hospital or doctor's office? Yes When did it last happen?      36 yrs ago If all above answers are NO, may proceed with cephalosporin use.   Percocet [Oxycodone-Acetaminophen ] Nausea And Vomiting    Past Medical History, Surgical history, Social history, and Family History were reviewed and updated.  Review of Systems: All other 10 point review of systems is negative.   Physical Exam:  weight is 195 lb 12.8 oz (88.8 kg). Her oral temperature is 98.8 F (37.1 C). Her blood pressure is 141/69 (abnormal) and her pulse is 75. Her respiration is 17 and oxygen saturation is 100%.   Wt Readings from Last 3 Encounters:  08/01/24 195 lb 12.8 oz (88.8 kg)  02/07/24 193 lb 11.2 oz (87.9 kg)  08/15/23 190 lb (86.2 kg)    Ocular: Sclerae unicteric, pupils equal, round and reactive to light Ear-nose-throat: Oropharynx clear, dentition fair Lymphatic: No cervical or supraclavicular adenopathy Lungs no rales or rhonchi, good excursion bilaterally Heart regular  rate and rhythm, no murmur appreciated Abd soft, nontender, positive bowel sounds MSK no focal spinal tenderness, no joint edema Neuro: non-focal, well-oriented, appropriate affect  Lab Results  Component Value Date   WBC 13.4 (H) 08/01/2024   HGB 14.0 08/01/2024   HCT 42.8 08/01/2024   MCV 90.1 08/01/2024   PLT 437 (H) 08/01/2024   No results found for: FERRITIN, IRON, TIBC, UIBC, IRONPCTSAT Lab Results  Component Value Date   RBC 4.75 08/01/2024   Lab Results  Component Value Date   KPAFRELGTCHN 18.3 08/07/2020   LAMBDASER 8.7 08/07/2020   KAPLAMBRATIO 2.10 (H) 08/07/2020   Lab Results  Component Value Date   IGGSERUM  868 07/31/2021   IGA 47 (L) 07/31/2021   IGMSERUM 26 07/31/2021   Lab Results  Component Value Date   TOTALPROTELP 6.8 08/07/2020   ALBUMINELP 4.3 08/07/2020   A1GS 0.1 08/07/2020   A2GS 0.6 08/07/2020   BETS 0.9 08/07/2020   GAMS 0.9 08/07/2020   MSPIKE Not Observed 08/07/2020     Chemistry      Component Value Date/Time   NA 139 08/03/2023 1329   NA 138 12/23/2021 0926   K 4.6 08/03/2023 1329   CL 104 08/03/2023 1329   CO2 28 08/03/2023 1329   BUN 16 08/03/2023 1329   BUN 18 12/23/2021 0926   CREATININE 0.82 08/03/2023 1329      Component Value Date/Time   CALCIUM 10.2 08/03/2023 1329   ALKPHOS 59 08/03/2023 1329   AST 12 (L) 08/03/2023 1329   ALT 6 08/03/2023 1329   BILITOT 0.4 08/03/2023 1329     Encounter Diagnosis  Name Primary?   CLL (chronic lymphocytic leukemia) (HCC) Yes    Impression and Plan: Melissa Harding is a very pleasant 63 yo [place] American female with stage A CLL. We follow her yearly for monitoring.   Her counts have remained stable with her average WBC being in the 11.5-13.4 range.  She is asymptomatic.  No intervention needed at this time.   RTC 1 year APP, labs (CBC w/, CMP, save smear)  Melissa CHRISTELLA Dais, PA-C 9/17/20251:52 PM

## 2024-08-06 ENCOUNTER — Ambulatory Visit (HOSPITAL_BASED_OUTPATIENT_CLINIC_OR_DEPARTMENT_OTHER): Admitting: Family Medicine

## 2024-08-06 ENCOUNTER — Encounter (HOSPITAL_BASED_OUTPATIENT_CLINIC_OR_DEPARTMENT_OTHER): Payer: Self-pay | Admitting: Family Medicine

## 2024-08-06 VITALS — BP 133/77 | HR 65 | Ht 62.0 in | Wt 196.2 lb

## 2024-08-06 DIAGNOSIS — E66812 Obesity, class 2: Secondary | ICD-10-CM | POA: Diagnosis not present

## 2024-08-06 DIAGNOSIS — M79642 Pain in left hand: Secondary | ICD-10-CM | POA: Diagnosis not present

## 2024-08-06 DIAGNOSIS — R110 Nausea: Secondary | ICD-10-CM | POA: Diagnosis not present

## 2024-08-06 DIAGNOSIS — Z23 Encounter for immunization: Secondary | ICD-10-CM | POA: Diagnosis not present

## 2024-08-06 MED ORDER — ONDANSETRON 4 MG PO TBDP: 4.0000 mg | ORAL_TABLET | Freq: Three times a day (TID) | ORAL | 0 refills | Status: AC | PRN

## 2024-08-06 MED ORDER — NALTREXONE-BUPROPION HCL ER 8-90 MG PO TB12: ORAL_TABLET | ORAL | 0 refills | Status: DC

## 2024-08-06 NOTE — Assessment & Plan Note (Signed)
 Long discussion today reviewing weight loss medications including injectable options including GLP-1 receptor agonist, oral agents including Contrave , orlistat, phentermine .  We discussed potential risk, benefits, cost associated with these various medications as well as the possibility of insurance coverage or lack thereof.  We discussed typical dosing regimen for both injectable and oral medications, proper administration of each.  We discussed potential outcomes with each. After discussion of potential risks and adverse reactions with these medications and potential benefits of each, patient would like to proceed with Contrave  if possible.  Prescription sent to pharmacy on file, if medication is cost prohibitive for patient, she will let us  know and we can look to send an alternative option.  Will plan for close follow-up to monitor response to whichever medication patient is able to initiate. Additional consideration is for referral to healthy weight and wellness clinic

## 2024-08-06 NOTE — Assessment & Plan Note (Signed)
 Reports an episode of dizziness last week. Has had similar episodes before. Notes that when she got out of bed, she felt she had inability to balance herself, had some nausea and vomiting. Symptoms lasted about half of the day. Still having some reported lightheadedness with occasional nausea, no further vomiting. On exam, patient is in no acute distress, vital signs stable.  Cardiovascular exam with regular rate and rhythm, lungs clear to auscultation bilaterally.  Extraocular movements are intact with pupils equal, round, reactive to light and accommodation.  Normal gait in office.  Normal speech. We discussed considerations today.  Generally, patient is doing better, exam normal in office today.  We did discuss potential causes for sudden onset symptoms that she described.  Discussed that in some cases this can be related to acute issues such as stroke, although this appears to be less likely currently.  Did advise that if she does have a sudden recurrence of symptoms, would recommend presenting to emergency department for further evaluation in order to exclude potential stroke.  At this time, can proceed with monitoring.  Will provide prescription for Zofran  to assist with control of nausea.

## 2024-08-06 NOTE — Assessment & Plan Note (Signed)
 Has been having some hand swelling. And some numbness of pinky finger of left hand. Swelling is more noticed in the fingers - index, middle. No pain in fingers, but some over dorsal surface of hand near wrist. Last about 1 hour, resolves with moving fingers and hands.  On exam, normal appearance on inspection.  No obvious swelling or discoloration noted.  There is mild tenderness to palpation at base of metacarpals and wrist over dorsum of hand.  No swelling or tenderness to palpation through index or middle finger.  Normal range of motion for all digits Exam today is normal.  We discussed general considerations.  We can proceed with conservative measures and she will wear brace while sleeping at night to see if there may be a positional cause for her observed symptoms.  If symptoms persist despite this, she will return to have x-rays completed for initial evaluation.

## 2024-08-06 NOTE — Progress Notes (Signed)
 Procedures performed today:    None.  Independent interpretation of notes and tests performed by another provider:   None.  Brief History, Exam, Impression, and Recommendations:    BP 133/77 (BP Location: Left Arm, Patient Position: Sitting, Cuff Size: Normal)   Pulse 65   Ht 5' 2 (1.575 m)   Wt 196 lb 3.2 oz (89 kg)   SpO2 100%   BMI 35.89 kg/m   Nausea Assessment & Plan: Reports an episode of dizziness last week. Has had similar episodes before. Notes that when she got out of bed, she felt she had inability to balance herself, had some nausea and vomiting. Symptoms lasted about half of the day. Still having some reported lightheadedness with occasional nausea, no further vomiting. On exam, patient is in no acute distress, vital signs stable.  Cardiovascular exam with regular rate and rhythm, lungs clear to auscultation bilaterally.  Extraocular movements are intact with pupils equal, round, reactive to light and accommodation.  Normal gait in office.  Normal speech. We discussed considerations today.  Generally, patient is doing better, exam normal in office today.  We did discuss potential causes for sudden onset symptoms that she described.  Discussed that in some cases this can be related to acute issues such as stroke, although this appears to be less likely currently.  Did advise that if she does have a sudden recurrence of symptoms, would recommend presenting to emergency department for further evaluation in order to exclude potential stroke.  At this time, can proceed with monitoring.  Will provide prescription for Zofran  to assist with control of nausea.   Encounter for immunization -     Flu vaccine trivalent PF, 6mos and older(Flulaval,Afluria,Fluarix,Fluzone)  Hand pain, left Assessment & Plan: Has been having some hand swelling. And some numbness of pinky finger of left hand. Swelling is more noticed in the fingers - index, middle. No pain in fingers, but some over  dorsal surface of hand near wrist. Last about 1 hour, resolves with moving fingers and hands.  On exam, normal appearance on inspection.  No obvious swelling or discoloration noted.  There is mild tenderness to palpation at base of metacarpals and wrist over dorsum of hand.  No swelling or tenderness to palpation through index or middle finger.  Normal range of motion for all digits Exam today is normal.  We discussed general considerations.  We can proceed with conservative measures and she will wear brace while sleeping at night to see if there may be a positional cause for her observed symptoms.  If symptoms persist despite this, she will return to have x-rays completed for initial evaluation.  Orders: -     DG Hand Complete Left; Future  Obesity, Class II, BMI 35-39.9 Assessment & Plan: Long discussion today reviewing weight loss medications including injectable options including GLP-1 receptor agonist, oral agents including Contrave , orlistat, phentermine .  We discussed potential risk, benefits, cost associated with these various medications as well as the possibility of insurance coverage or lack thereof.  We discussed typical dosing regimen for both injectable and oral medications, proper administration of each.  We discussed potential outcomes with each. After discussion of potential risks and adverse reactions with these medications and potential benefits of each, patient would like to proceed with Contrave  if possible.  Prescription sent to pharmacy on file, if medication is cost prohibitive for patient, she will let us  know and we can look to send an alternative option.  Will plan for close  follow-up to monitor response to whichever medication patient is able to initiate. Additional consideration is for referral to healthy weight and wellness clinic  Orders: -     Naltrexone -buPROPion  HCl ER; 1 tab daily for week 1, then 1 tab BID for week 2, then 2 tab PO qAM and 1 tab PO qPM for week 3,  then 2 tabs BID.  Dispense: 120 tablet; Refill: 0  Other orders -     Ondansetron ; Take 1 tablet (4 mg total) by mouth every 8 (eight) hours as needed for nausea or vomiting.  Dispense: 20 tablet; Refill: 0  Return in about 6 weeks (around 09/17/2024) for med check.   ___________________________________________ Juleen Sorrels de [place], MD, ABFM, [facility] Medicine [facility]

## 2024-08-06 NOTE — Patient Instructions (Signed)
  Medication Instructions:  Your physician recommends that you continue on your current medications as directed. Please refer to the Current Medication list given to you today. --If you need a refill on any your medications before your next appointment, please call your pharmacy first. If no refills are authorized on file call the office.--   Follow-Up: Your next appointment:   Your physician recommends that you schedule a follow-up appointment in: 6-8 week follow up  with Dr. de [place]  You will receive a text message or e-mail with a link to a survey about your care and experience with [place] today! We would greatly appreciate your feedback!   Thanks for letting us be apart of your health journey!!  Primary Care and Sports Medicine   Dr. Ceasar Mons [place]   We encourage you to activate your patient portal called "MyChart".  Sign up information is provided on this After Visit Summary.  MyChart is used to connect with patients for Virtual Visits (Telemedicine).  Patients are able to view lab/test results, encounter notes, upcoming appointments, etc.  Non-urgent messages can be sent to your provider as well. To learn more about what you can do with MyChart, please visit --  [email or web address].

## 2024-08-07 ENCOUNTER — Encounter (HOSPITAL_BASED_OUTPATIENT_CLINIC_OR_DEPARTMENT_OTHER): Payer: Self-pay | Admitting: Family Medicine

## 2024-08-07 DIAGNOSIS — E66812 Obesity, class 2: Secondary | ICD-10-CM

## 2024-08-07 DIAGNOSIS — Z6837 Body mass index (BMI) 37.0-37.9, adult: Secondary | ICD-10-CM

## 2024-08-13 MED ORDER — NALTREXONE-BUPROPION HCL ER 8-90 MG PO TB12: ORAL_TABLET | ORAL | 0 refills | Status: AC

## 2024-09-04 NOTE — Telephone Encounter (Signed)
 Please see mychart sent by pt and advise if you are okay resending phentermine  to pharmacy for pt. Rx has been pended. Pharmacy is [facility].

## 2024-09-04 NOTE — Telephone Encounter (Signed)
 Please see new message sent by pt and advise.

## 2024-09-11 ENCOUNTER — Other Ambulatory Visit (HOSPITAL_BASED_OUTPATIENT_CLINIC_OR_DEPARTMENT_OTHER): Payer: Self-pay | Admitting: Family Medicine

## 2024-09-11 MED ORDER — PHENTERMINE HCL 30 MG PO CAPS: 30.0000 mg | ORAL_CAPSULE | ORAL | 1 refills | Status: DC

## 2024-09-17 ENCOUNTER — Encounter: Payer: Self-pay | Admitting: Radiology

## 2024-09-17 ENCOUNTER — Ambulatory Visit: Payer: Self-pay | Admitting: *Deleted

## 2024-09-17 MED ORDER — PHENTERMINE HCL 37.5 MG PO TABS: 37.5000 mg | ORAL_TABLET | Freq: Every day | ORAL | 1 refills | Status: AC

## 2024-09-17 NOTE — Telephone Encounter (Signed)
 FYI Only or Action Required?: FYI only for provider: appointment scheduled on 11/4.  Patient was last seen in primary care on 08/06/2024 by de [place], Quintin PARAS, MD.  Called Nurse Triage reporting Leg Swelling.  Symptoms began several days ago.  Interventions attempted: OTC medications: Voltaren .  Symptoms are: gradually worsening.  Triage Disposition: See Physician Within 24 Hours  Patient/caregiver understands and will follow disposition?: Yes  Copied from CRM #[id number]. Topic: Clinical - Red Word Triage >> Sep 17, 2024  1:34 PM Melissa Harding wrote: Red Word that prompted transfer to Nurse Triage: Patient with painful left leg swelling Reason for Disposition  SEVERE swelling (e.g., can't move swollen knee at all)  Answer Assessment - Initial Assessment Questions 1. LOCATION: Where is the swelling located?  (e.g., left, right, both knees)     Left knee swelling 2. ONSET: When did the swelling start? Does it come and go, or is it there all the time?     5 days 3. SWELLING: How bad is the swelling? Or, How large is it? (e.g., mild, moderate, severe; size of localized swelling)      severe 4. PAIN: Is there any pain? If Yes, ask: How bad is it? (Scale 0-10; or none, mild, moderate, severe)     8/10 5. SETTING: Has there been any recent work, exercise or other activity that involved that part of the body?      no 6. AGGRAVATING FACTORS: What makes the knee swelling worse? (e.g., walking, climbing stairs, running)     Everything 7. ASSOCIATED SYMPTOMS: Is there any pain or redness?     no 8. OTHER SYMPTOMS: Do you have any other symptoms? (e.g., calf pain, chest pain, difficulty breathing, fever)     Back of knee is painful- hard to bend the knee  Protocols used: Knee Swelling-A-AH

## 2024-09-18 ENCOUNTER — Encounter (HOSPITAL_BASED_OUTPATIENT_CLINIC_OR_DEPARTMENT_OTHER): Payer: Self-pay | Admitting: Family Medicine

## 2024-09-18 ENCOUNTER — Encounter (HOSPITAL_BASED_OUTPATIENT_CLINIC_OR_DEPARTMENT_OTHER): Payer: Self-pay

## 2024-09-18 NOTE — Progress Notes (Deleted)
 Acute Care Office Visit  Subjective:   Melissa Harding 1961-04-28 09/18/2024  Chief Complaint  Patient presents with   Knee Pain    Pt has been having pain and swelling in both knees but states that the left one is worse than the right. States it has been going on for about 5 days. Has been using volteran gel as well as taking advil for the pain.    HPI: KNEE PAIN:  Onset: {Blank single:19197::sudden,gradual}  Location: {Blank single:19197::left,right,bilateral} {Blank single:19197::anterior,posterior,lateral,medial,diffuse} Duration: {Blank single:19197::chronic,days,weeks,months} Quality:  {Blank multiple:19196::sharp,dull,aching,burning,cramping,ill-defined,itchy,pressure-like,pulling,shooting,sore,stabbing,tender,tearing,throbbing} Radiating pain: {Blank single:19197::yes,no} Aggravating factors: {Blank multiple:19196::weight bearing,walking,running,stairs,bending,movement,prolonged sitting}  Alleviating factors: {Blank multiple:19196::nothing,ice,physical therapy,HEP,APAP,NSAIDs,brace,crutches,rest}  Treatments attempted: {Blank multiple:19196::none,rest,ice,heat,APAP,ibuprofen,aleve ,physical therapy,HEP}    Recent injury: {Blank single:19197::yes,no}  Mechanism of injury:  Weakness with weight bearing or walking: {Blank single:19197::yes,no} Locking: {Blank single:19197::yes,no} Popping: {Blank single:19197::yes,no} Bruising: {Blank single:19197::yes,no} Swelling: {Blank single:19197::yes,no} Redness: {Blank single:19197::yes,no} Paresthesias/decreased sensation: {Blank single:19197::yes,no} Fevers: {Blank single:19197::yes,no}    The following portions of the patient's history were reviewed and updated as appropriate: past medical history, past surgical history, family history, social history, allergies, medications,  and problem list.   Patient Active Problem List   Diagnosis Date Noted   Nausea 08/06/2024   Hand pain, left 08/06/2024   Obesity, Class II, BMI 35-39.9 08/06/2024   Dry mouth 02/07/2024   Prediabetes 02/07/2024   Rash 04/19/2023   Abscess 06/22/2022   Post-menopausal bleeding 02/16/2022   Acute left-sided low back pain without sciatica 02/16/2022   BMI 37.0-37.9, adult 12/25/2021   Chronic left shoulder pain 10/22/2021   Primary osteoarthritis of right knee 07/21/2021   CLL (chronic lymphocytic leukemia) (HCC) 08/07/2020   Encounter for annual physical exam 08/07/2020   Personal history of CLL (chronic lymphocytic leukemia) 11/01/2019   Pneumonia due to COVID-19 virus 11/01/2019   Lymphocytosis 02/05/2019   MENOPAUSAL SYNDROME 03/05/2009   Dysphagia 03/05/2009   Menopausal syndrome 03/05/2009   Muscle spasm 03/05/2009   Esophageal reflux 10/14/2004   Past Medical History:  Diagnosis Date   Acute medial meniscus tear, right, subsequent encounter 03/11/2021   CLL (chronic lymphocytic leukemia) (HCC) 08/07/2020   Goals of care, counseling/discussion 08/07/2020   MMT (medial meniscus tear)    right   Past Surgical History:  Procedure Laterality Date   APPENDECTOMY     CESAREAN SECTION     2 previous   ECTOPIC PREGNANCY SURGERY     x2   KNEE ARTHROSCOPY Right 03/12/2021   Procedure: RIGHT KNEE ARTHROSCOPY WITH PARTIAL MEDIAL MENISCECTOMY;  Surgeon: Vernetta Lonni GRADE, MD;  Location: [facility];  Service: Orthopedics;  Laterality: Right;   LAPAROSCOPIC TUBAL LIGATION     TUBAL LIGATION     Family History  Problem Relation Age of Onset   Hypertension Mother    Hypertension Father    Cancer Paternal Grandmother    Stomach cancer Paternal Grandmother    Heart attack Paternal Grandfather    Colon cancer Neg Hx    Colon polyps Neg Hx    Esophageal cancer Neg Hx    Rectal cancer Neg Hx    BRCA 1/2 Neg Hx    Breast cancer Neg Hx    Outpatient  Medications Prior to Visit  Medication Sig Dispense Refill   doxylamine, Sleep, (UNISOM) 25 MG tablet Take 25 mg by mouth at bedtime as needed for sleep.     phentermine  (ADIPEX-P ) 37.5 MG tablet Take 1 tablet (37.5 mg total) by mouth daily before breakfast. 30 tablet 1  triamcinolone  cream (KENALOG ) 0.1 % Apply 1 Application topically 2 (two) times daily. 30 g 1   doxylamine, Sleep, (UNISOM) 25 MG tablet Take 25 mg by mouth at bedtime as needed.     Naltrexone -buPROPion  HCl ER 8-90 MG TB12 1 tab daily for week 1, then 1 tab BID for week 2, then 2 tab PO qAM and 1 tab PO qPM for week 3, then 2 tabs BID. 120 tablet 0   ondansetron  (ZOFRAN -ODT) 4 MG disintegrating tablet Take 1 tablet (4 mg total) by mouth every 8 (eight) hours as needed for nausea or vomiting. 20 tablet 0   No facility-administered medications prior to visit.   Allergies  Allergen Reactions   Ampicillin Other (See Comments)    REACTION: seizures Did it involve swelling of the face/tongue/throat, SOB, or low BP? No Did it involve sudden or severe rash/hives, skin peeling, or any reaction on the inside of your mouth or nose? No Did you need to seek medical attention at a hospital or doctor's office? Yes When did it last happen?      36 yrs ago If all above answers are NO, may proceed with cephalosporin use.   Percocet [Oxycodone-Acetaminophen ] Nausea And Vomiting     ROS: A complete ROS was performed with pertinent positives/negatives noted in the HPI. The remainder of the ROS are negative.    Objective:   Today's Vitals   09/18/24 1439  BP: 138/85  Pulse: 83  SpO2: 100%  Weight: 90 kg  Height: 5' 2 (1.575 m)    GENERAL: Well-appearing, in NAD. Well nourished.  SKIN: Pink, warm and dry. No rash, lesion, ulceration, or ecchymoses.  Head: Normocephalic. NECK: Trachea midline. Full ROM w/o pain or tenderness. No lymphadenopathy.  EARS: Tympanic membranes are intact, translucent without bulging and without  drainage. Appropriate landmarks visualized.  EYES: Conjunctiva clear without exudates. EOMI, PERRL, no drainage present.  NOSE: Septum midline w/o deformity. Nares patent, mucosa pink and non-inflamed w/o drainage. No sinus tenderness.  THROAT: Uvula midline. Oropharynx clear. Tonsils non-inflamed without exudate. Mucous membranes pink and moist.  RESPIRATORY: Chest wall symmetrical. Respirations even and non-labored. Breath sounds clear to auscultation bilaterally.  CARDIAC: S1, S2 present, regular rate and rhythm without murmur or gallops. Peripheral pulses 2+ bilaterally.  MSK: Muscle tone and strength appropriate for age. Joints w/o tenderness, redness, or swelling.  EXTREMITIES: Without clubbing, cyanosis, or edema.  NEUROLOGIC: No motor or sensory deficits. Steady, even gait. C2-C12 intact.  PSYCH/MENTAL STATUS: Alert, oriented x 3. Cooperative, appropriate mood and affect.    No results found for any visits on 09/18/24.    Assessment & Plan:  ***  No orders of the defined types were placed in this encounter.  Lab Orders  No laboratory test(s) ordered today   No images are attached to the encounter or orders placed in the encounter.  No follow-ups on file.    Patient to reach out to office if new, worrisome, or unresolved symptoms arise or if no improvement in patient's condition. Patient verbalized understanding and is agreeable to treatment plan. All questions answered to patient's satisfaction.   Treatment plan and recommendation(s) reviewed by supervising preceptor, Thersia CLEMENTEEN Stark, FNP-C, prior to clinic discharge.    Rosina Ada, BSN, RN DNP Student

## 2024-09-18 NOTE — Progress Notes (Signed)
 This encounter was created in error - please disregard. Patient left without being seen.

## 2024-10-03 ENCOUNTER — Ambulatory Visit (HOSPITAL_BASED_OUTPATIENT_CLINIC_OR_DEPARTMENT_OTHER): Payer: Self-pay | Admitting: Family Medicine

## 2024-11-26 ENCOUNTER — Other Ambulatory Visit: Payer: Self-pay | Admitting: Family Medicine

## 2024-11-26 DIAGNOSIS — Z1231 Encounter for screening mammogram for malignant neoplasm of breast: Secondary | ICD-10-CM

## 2024-12-04 ENCOUNTER — Ambulatory Visit: Admission: RE | Admit: 2024-12-04 | Discharge: 2024-12-04 | Disposition: A | Payer: Self-pay | Source: Ambulatory Visit

## 2024-12-04 DIAGNOSIS — Z1231 Encounter for screening mammogram for malignant neoplasm of breast: Secondary | ICD-10-CM

## 2024-12-19 ENCOUNTER — Ambulatory Visit (HOSPITAL_BASED_OUTPATIENT_CLINIC_OR_DEPARTMENT_OTHER): Payer: Self-pay | Admitting: Family Medicine

## 2025-08-01 ENCOUNTER — Ambulatory Visit: Admitting: Medical Oncology

## 2025-08-01 ENCOUNTER — Inpatient Hospital Stay: Payer: Self-pay
